# Patient Record
Sex: Female | Born: 1955 | Race: White | Hispanic: No | Smoking: Current every day smoker
Health system: Southern US, Community
[De-identification: ages and names within clinical notes are randomized; demographics above are authoritative.]

## PROBLEM LIST (undated history)

## (undated) ENCOUNTER — Emergency Department (HOSPITAL_COMMUNITY): Admission: EM | Payer: Medicare Other | Source: Home / Self Care

## (undated) DIAGNOSIS — F329 Major depressive disorder, single episode, unspecified: Secondary | ICD-10-CM

## (undated) DIAGNOSIS — T4145XA Adverse effect of unspecified anesthetic, initial encounter: Secondary | ICD-10-CM

## (undated) DIAGNOSIS — F419 Anxiety disorder, unspecified: Secondary | ICD-10-CM

## (undated) DIAGNOSIS — E039 Hypothyroidism, unspecified: Secondary | ICD-10-CM

## (undated) DIAGNOSIS — I749 Embolism and thrombosis of unspecified artery: Secondary | ICD-10-CM

## (undated) DIAGNOSIS — IMO0001 Reserved for inherently not codable concepts without codable children: Secondary | ICD-10-CM

## (undated) DIAGNOSIS — K529 Noninfective gastroenteritis and colitis, unspecified: Secondary | ICD-10-CM

## (undated) DIAGNOSIS — Z87440 Personal history of urinary (tract) infections: Secondary | ICD-10-CM

## (undated) DIAGNOSIS — K3184 Gastroparesis: Principal | ICD-10-CM

## (undated) DIAGNOSIS — E785 Hyperlipidemia, unspecified: Secondary | ICD-10-CM

## (undated) DIAGNOSIS — G43909 Migraine, unspecified, not intractable, without status migrainosus: Secondary | ICD-10-CM

## (undated) DIAGNOSIS — R079 Chest pain, unspecified: Secondary | ICD-10-CM

## (undated) DIAGNOSIS — K219 Gastro-esophageal reflux disease without esophagitis: Secondary | ICD-10-CM

## (undated) DIAGNOSIS — I10 Essential (primary) hypertension: Secondary | ICD-10-CM

## (undated) DIAGNOSIS — I2510 Atherosclerotic heart disease of native coronary artery without angina pectoris: Secondary | ICD-10-CM

## (undated) DIAGNOSIS — F32A Depression, unspecified: Secondary | ICD-10-CM

## (undated) DIAGNOSIS — T8859XA Other complications of anesthesia, initial encounter: Secondary | ICD-10-CM

## (undated) DIAGNOSIS — D649 Anemia, unspecified: Secondary | ICD-10-CM

## (undated) DIAGNOSIS — F4310 Post-traumatic stress disorder, unspecified: Secondary | ICD-10-CM

## (undated) DIAGNOSIS — K922 Gastrointestinal hemorrhage, unspecified: Secondary | ICD-10-CM

## (undated) DIAGNOSIS — Z720 Tobacco use: Secondary | ICD-10-CM

## (undated) DIAGNOSIS — Z5189 Encounter for other specified aftercare: Secondary | ICD-10-CM

## (undated) HISTORY — PX: BREAST SURGERY: SHX581

## (undated) HISTORY — DX: Chest pain, unspecified: R07.9

## (undated) HISTORY — DX: Tobacco use: Z72.0

## (undated) HISTORY — DX: Hypothyroidism, unspecified: E03.9

## (undated) HISTORY — DX: Hyperlipidemia, unspecified: E78.5

## (undated) HISTORY — DX: Personal history of urinary (tract) infections: Z87.440

## (undated) HISTORY — PX: BREAST EXCISIONAL BIOPSY: SUR124

## (undated) HISTORY — PX: CORONARY ANGIOPLASTY WITH STENT PLACEMENT: SHX49

## (undated) HISTORY — DX: Atherosclerotic heart disease of native coronary artery without angina pectoris: I25.10

## (undated) HISTORY — DX: Essential (primary) hypertension: I10

## (undated) HISTORY — DX: Noninfective gastroenteritis and colitis, unspecified: K52.9

---

## 1979-02-24 HISTORY — PX: ABDOMINAL HYSTERECTOMY: SHX81

## 1979-02-24 HISTORY — PX: PILONIDAL CYST / SINUS EXCISION: SUR543

## 1979-02-24 HISTORY — PX: TMJ ARTHROPLASTY: SHX1066

## 1979-02-24 HISTORY — PX: SALPINGOOPHORECTOMY: SHX82

## 1981-06-25 HISTORY — PX: VESICOVAGINAL FISTULA CLOSURE W/ TAH: SUR271

## 1999-02-24 HISTORY — PX: CHOLECYSTECTOMY: SHX55

## 2000-06-25 DIAGNOSIS — I749 Embolism and thrombosis of unspecified artery: Secondary | ICD-10-CM

## 2000-06-25 HISTORY — PX: CORONARY ARTERY BYPASS GRAFT: SHX141

## 2000-06-25 HISTORY — DX: Embolism and thrombosis of unspecified artery: I74.9

## 2002-02-21 ENCOUNTER — Encounter: Payer: Self-pay | Admitting: Emergency Medicine

## 2002-02-21 ENCOUNTER — Inpatient Hospital Stay (HOSPITAL_COMMUNITY): Admission: EM | Admit: 2002-02-21 | Discharge: 2002-02-24 | Payer: Self-pay | Admitting: Emergency Medicine

## 2007-01-13 ENCOUNTER — Emergency Department (HOSPITAL_COMMUNITY): Admission: EM | Admit: 2007-01-13 | Discharge: 2007-01-13 | Payer: Self-pay | Admitting: Emergency Medicine

## 2007-01-25 ENCOUNTER — Emergency Department (HOSPITAL_COMMUNITY): Admission: EM | Admit: 2007-01-25 | Discharge: 2007-01-25 | Payer: Self-pay | Admitting: Family Medicine

## 2010-09-06 ENCOUNTER — Emergency Department (HOSPITAL_COMMUNITY): Payer: Medicare Other

## 2010-09-06 ENCOUNTER — Inpatient Hospital Stay (HOSPITAL_COMMUNITY)
Admission: EM | Admit: 2010-09-06 | Discharge: 2010-09-08 | DRG: 247 | Disposition: A | Discharge status: Home / Self Care | Payer: Medicare Other | Attending: Cardiology | Admitting: Cardiology

## 2010-09-06 DIAGNOSIS — Z7902 Long term (current) use of antithrombotics/antiplatelets: Secondary | ICD-10-CM

## 2010-09-06 DIAGNOSIS — F17200 Nicotine dependence, unspecified, uncomplicated: Secondary | ICD-10-CM | POA: Diagnosis present

## 2010-09-06 DIAGNOSIS — I498 Other specified cardiac arrhythmias: Secondary | ICD-10-CM | POA: Diagnosis present

## 2010-09-06 DIAGNOSIS — I25810 Atherosclerosis of coronary artery bypass graft(s) without angina pectoris: Secondary | ICD-10-CM | POA: Diagnosis present

## 2010-09-06 DIAGNOSIS — Z7982 Long term (current) use of aspirin: Secondary | ICD-10-CM

## 2010-09-06 DIAGNOSIS — I200 Unstable angina: Secondary | ICD-10-CM | POA: Diagnosis present

## 2010-09-06 DIAGNOSIS — R079 Chest pain, unspecified: Secondary | ICD-10-CM

## 2010-09-06 DIAGNOSIS — E785 Hyperlipidemia, unspecified: Secondary | ICD-10-CM | POA: Diagnosis present

## 2010-09-06 DIAGNOSIS — I2510 Atherosclerotic heart disease of native coronary artery without angina pectoris: Principal | ICD-10-CM | POA: Diagnosis present

## 2010-09-06 DIAGNOSIS — Z9861 Coronary angioplasty status: Secondary | ICD-10-CM

## 2010-09-06 DIAGNOSIS — E039 Hypothyroidism, unspecified: Secondary | ICD-10-CM | POA: Diagnosis present

## 2010-09-06 DIAGNOSIS — I10 Essential (primary) hypertension: Secondary | ICD-10-CM | POA: Diagnosis present

## 2010-09-06 LAB — HEPARIN LEVEL (UNFRACTIONATED): Heparin Unfractionated: 0.56 IU/mL (ref 0.30–0.70)

## 2010-09-06 LAB — POCT I-STAT, CHEM 8
BUN: 11 mg/dL (ref 6–23)
Calcium, Ion: 1.17 mmol/L (ref 1.12–1.32)
Chloride: 105 meq/L (ref 96–112)
Creatinine, Ser: 0.8 mg/dL (ref 0.4–1.2)
Glucose, Bld: 102 mg/dL — ABNORMAL HIGH (ref 70–99)
HCT: 42 % (ref 36.0–46.0)
Hemoglobin: 14.3 g/dL (ref 12.0–15.0)
Potassium: 4.3 meq/L (ref 3.5–5.1)
Sodium: 138 meq/L (ref 135–145)
TCO2: 21 mmol/L (ref 0–100)

## 2010-09-06 LAB — CBC
HCT: 41.7 % (ref 36.0–46.0)
Hemoglobin: 14.4 g/dL (ref 12.0–15.0)
MCH: 31.7 pg (ref 26.0–34.0)
MCHC: 34.5 g/dL (ref 30.0–36.0)

## 2010-09-06 LAB — CK TOTAL AND CKMB (NOT AT ARMC)
Relative Index: INVALID (ref 0.0–2.5)
Total CK: 52 U/L (ref 7–177)

## 2010-09-06 LAB — TROPONIN I: Troponin I: 0.01 ng/mL (ref 0.00–0.06)

## 2010-09-06 LAB — DIFFERENTIAL
Lymphocytes Relative: 42 % (ref 12–46)
Lymphs Abs: 3.4 10*3/uL (ref 0.7–4.0)
Monocytes Absolute: 0.4 10*3/uL (ref 0.1–1.0)
Monocytes Relative: 4 % (ref 3–12)
Neutro Abs: 4.2 10*3/uL (ref 1.7–7.7)

## 2010-09-06 LAB — POCT CARDIAC MARKERS: Myoglobin, poc: 38.2 ng/mL (ref 12–200)

## 2010-09-07 DIAGNOSIS — I25810 Atherosclerosis of coronary artery bypass graft(s) without angina pectoris: Secondary | ICD-10-CM

## 2010-09-07 DIAGNOSIS — I200 Unstable angina: Secondary | ICD-10-CM

## 2010-09-07 LAB — BASIC METABOLIC PANEL
BUN: 12 mg/dL (ref 6–23)
Calcium: 9 mg/dL (ref 8.4–10.5)
Creatinine, Ser: 0.72 mg/dL (ref 0.4–1.2)
GFR calc non Af Amer: >60 mL/min (ref >60)
Glucose, Bld: 93 mg/dL (ref 70–99)
Sodium: 141 mEq/L (ref 135–145)

## 2010-09-07 LAB — POCT ACTIVATED CLOTTING TIME
Activated Clotting Time: 122 seconds
Activated Clotting Time: 429 seconds

## 2010-09-07 LAB — CBC
Hemoglobin: 13.4 g/dL (ref 12.0–15.0)
MCHC: 33.3 g/dL (ref 30.0–36.0)
Platelets: 225 10*3/uL (ref 150–400)

## 2010-09-07 LAB — LIPID PANEL: Triglycerides: 206 mg/dL — ABNORMAL HIGH (ref <150)

## 2010-09-07 LAB — PROTIME-INR
INR: 0.99 (ref 0.00–1.49)
Prothrombin Time: 13.3 seconds (ref 11.6–15.2)

## 2010-09-07 LAB — CARDIAC PANEL(CRET KIN+CKTOT+MB+TROPI)
CK, MB: 0.7 ng/mL (ref 0.3–4.0)
Relative Index: INVALID (ref 0.0–2.5)
Total CK: 46 U/L (ref 7–177)

## 2010-09-07 LAB — HEMOGLOBIN A1C: Mean Plasma Glucose: 114 mg/dL (ref <117)

## 2010-09-08 DIAGNOSIS — I200 Unstable angina: Secondary | ICD-10-CM

## 2010-09-08 LAB — CARDIAC PANEL(CRET KIN+CKTOT+MB+TROPI)
Relative Index: INVALID (ref 0.0–2.5)
Total CK: 45 U/L (ref 7–177)
Troponin I: 0.08 ng/mL — ABNORMAL HIGH (ref 0.00–0.06)

## 2010-09-08 LAB — CBC
Hemoglobin: 12.7 g/dL (ref 12.0–15.0)
Platelets: 190 10*3/uL (ref 150–400)
RBC: 4.05 MIL/uL (ref 3.87–5.11)
WBC: 5.9 10*3/uL (ref 4.0–10.5)

## 2010-09-08 LAB — BASIC METABOLIC PANEL
CO2: 26 mEq/L (ref 19–32)
Chloride: 112 mEq/L (ref 96–112)
GFR calc Af Amer: >60 mL/min (ref >60)
Potassium: 4.3 mEq/L (ref 3.5–5.1)
Sodium: 141 mEq/L (ref 135–145)

## 2010-09-09 ENCOUNTER — Observation Stay (HOSPITAL_COMMUNITY)
Admission: EM | Admit: 2010-09-09 | Discharge: 2010-09-10 | Disposition: A | Discharge status: Home / Self Care | Payer: Medicare Other | Attending: Cardiovascular Disease | Admitting: Cardiovascular Disease

## 2010-09-09 ENCOUNTER — Emergency Department (HOSPITAL_COMMUNITY): Payer: Medicare Other

## 2010-09-09 DIAGNOSIS — Z79899 Other long term (current) drug therapy: Secondary | ICD-10-CM | POA: Insufficient documentation

## 2010-09-09 DIAGNOSIS — Z9861 Coronary angioplasty status: Secondary | ICD-10-CM | POA: Insufficient documentation

## 2010-09-09 DIAGNOSIS — I2510 Atherosclerotic heart disease of native coronary artery without angina pectoris: Secondary | ICD-10-CM | POA: Insufficient documentation

## 2010-09-09 DIAGNOSIS — E039 Hypothyroidism, unspecified: Secondary | ICD-10-CM | POA: Insufficient documentation

## 2010-09-09 DIAGNOSIS — I498 Other specified cardiac arrhythmias: Secondary | ICD-10-CM | POA: Insufficient documentation

## 2010-09-09 DIAGNOSIS — R079 Chest pain, unspecified: Principal | ICD-10-CM | POA: Insufficient documentation

## 2010-09-09 DIAGNOSIS — E785 Hyperlipidemia, unspecified: Secondary | ICD-10-CM | POA: Insufficient documentation

## 2010-09-09 DIAGNOSIS — I10 Essential (primary) hypertension: Secondary | ICD-10-CM | POA: Insufficient documentation

## 2010-09-09 DIAGNOSIS — Z7982 Long term (current) use of aspirin: Secondary | ICD-10-CM | POA: Insufficient documentation

## 2010-09-09 DIAGNOSIS — Z7902 Long term (current) use of antithrombotics/antiplatelets: Secondary | ICD-10-CM | POA: Insufficient documentation

## 2010-09-09 DIAGNOSIS — F17200 Nicotine dependence, unspecified, uncomplicated: Secondary | ICD-10-CM | POA: Insufficient documentation

## 2010-09-09 LAB — TROPONIN I: Troponin I: 0.06 ng/mL (ref 0.00–0.06)

## 2010-09-09 LAB — URINALYSIS, ROUTINE W REFLEX MICROSCOPIC
Glucose, UA: NEGATIVE mg/dL
Ketones, ur: NEGATIVE mg/dL
Leukocytes, UA: NEGATIVE
Nitrite: NEGATIVE
Protein, ur: NEGATIVE mg/dL

## 2010-09-09 LAB — DIFFERENTIAL
Basophils Absolute: 0 10*3/uL (ref 0.0–0.1)
Basophils Relative: 0 % (ref 0–1)
Eosinophils Absolute: 0.1 10*3/uL (ref 0.0–0.7)
Neutro Abs: 6 10*3/uL (ref 1.7–7.7)
Neutrophils Relative %: 69 % (ref 43–77)

## 2010-09-09 LAB — CBC
Hemoglobin: 15 g/dL (ref 12.0–15.0)
MCH: 32.7 pg (ref 26.0–34.0)
MCHC: 35.7 g/dL (ref 30.0–36.0)
Platelets: 221 10*3/uL (ref 150–400)
RBC: 4.59 MIL/uL (ref 3.87–5.11)

## 2010-09-09 LAB — CK TOTAL AND CKMB (NOT AT ARMC)
Relative Index: INVALID (ref 0.0–2.5)
Relative Index: INVALID (ref 0.0–2.5)
Total CK: 61 U/L (ref 7–177)

## 2010-09-09 LAB — BASIC METABOLIC PANEL
Calcium: 9.6 mg/dL (ref 8.4–10.5)
Chloride: 105 mEq/L (ref 96–112)
Creatinine, Ser: 0.66 mg/dL (ref 0.4–1.2)
GFR calc Af Amer: >60 mL/min (ref >60)
GFR calc non Af Amer: >60 mL/min (ref >60)

## 2010-09-09 LAB — URINE MICROSCOPIC-ADD ON

## 2010-09-10 DIAGNOSIS — R079 Chest pain, unspecified: Secondary | ICD-10-CM

## 2010-09-10 LAB — TROPONIN I
Troponin I: 0.05 ng/mL (ref 0.00–0.06)
Troponin I: 0.05 ng/mL (ref 0.00–0.06)

## 2010-09-10 LAB — CK TOTAL AND CKMB (NOT AT ARMC)
CK, MB: 0.4 ng/mL (ref 0.3–4.0)
Relative Index: INVALID (ref 0.0–2.5)
Total CK: 38 U/L (ref 7–177)

## 2010-09-10 LAB — PLATELET INHIBITION P2Y12: P2Y12 % Inhibition: 38 %

## 2010-09-14 NOTE — Discharge Summary (Signed)
[name], [name]                ACCOUNT NO.:  [id number]  MEDICAL RECORD NO.:  [id number]           PATIENT TYPE:  I  LOCATION:  2918                         FACILITY:  [facility]  PHYSICIAN:  Renelle Stegenga M. [place], M.D.  DATE OF BIRTH:  1955/11/07  DATE OF ADMISSION:  09/06/2010 DATE OF DISCHARGE:  09/08/2010                              DISCHARGE SUMMARY   PRIMARY CARDIOLOGIST:  Krisy Dix M. [place], MD.  She was previously followed by Dr. Lillia Dallas in [place], [place], fax number [phone].  DISCHARGE DIAGNOSIS:  Unstable angina.  SECONDARY DIAGNOSES: 1. Coronary artery disease, status post prior coronary artery bypass     grafting x2 with subsequent stenting of the vein graft to the     obtuse marginal. 2. Hypertension. 3. Hyperlipidemia. 4. Ongoing tobacco abuse. 5. History of sinus tachycardia. 6. Hypothyroidism. 7. History of multiple urinary tract infections. 8. History of mastocytic enterocolitis.  ALLERGIES:  No known drug allergies.  PROCEDURES:  Left heart cardiac catheterization revealing 60-70% stenosis within the ostium of the vein graft to the ramus intermedius followed by a 90% stenosis in the distal body of the same graft.  The patient has a chronically occluded obtuse marginal.  There was also noted to be an occluded vein graft of the aortic root.  The subclavian was atretic.  The vein graft to the intermedius was successfully stented proximally with 2.75 x 38 mm Xience Prime drug-eluting stent.  The distal portion of the graft was stented with a 3.0 x 24 mm Promus Element Plus drug-eluting stent.  HISTORY OF PRESENT ILLNESS:  This is a 55 year old female with prior history of CAD status post two-vessel CABG in [place] who has recently moved to [place] and has not yet established regular care.  She was in the usual state of health until the day of admission when she developed substernal chest discomfort associated with nausea and diaphoresis  occurring with exertion and relieved after a total of 4 sublingual nitroglycerin tablets.  She presented to [facility] ED where ECG showed no acute changes and point-of-care markers were negative.  She admitted for further evaluation.  HOSPITAL COURSE:  Given the patient's prior history and presentation, decision was made to pursue cardiac catheterization.  This took place on September 07, 2010, revealing significant stenoses throughout the body of the vein graft to the ramus intermedius with previously placed patent stent in the midportion of the body of the graft.  This was felt to be the culprit vessel and was felt subsequently stented with 2 drug-eluting stents as outlined above involving the proximal and distal portions of the graft.  The patient tolerated this procedure well and postprocedure has been ambulating without recurrent symptoms or limitations.  She has been seen by cardiac rehab and also counseled on the importance of the smoking cessation.  She will be discharged home today in good condition.  DISCHARGE LABORATORY DATA:  Hemoglobin 12.7, hematocrit 38.2, WBC 5.9, and platelets 190.  Sodium 141, potassium 4.3, chloride 112, CO2 of 26, BUN 8, creatinine 0.67, glucose 86, and calcium 8.4.  Hemoglobin A1c 5.6.  CK 45, MB 1.6,  and troponin I of 0.08 (following PCI).  Total cholesterol 184, triglycerides 206, HDL 32, and LDL 111.  TSH 3.810. MRSA screen was negative.  DISPOSITION:  The patient will be discharged home today in good condition.  FOLLOWUP PLANS AND APPOINTMENTS:  We have arranged followup with Dr. Oluwaferanmi Wain [place] on September 28, 2010, at 4:15 p.m.  We have recommended that the patient obtain primary care followup locally.  DISCHARGE MEDICATIONS: 1. Ambien 10 mg nightly p.r.n. 2. Nitroglycerin 0.4 mg sublingual p.r.n. chest pain. 3. Xanax 0.25 mg q.12 h. p.r.n. 4. Aspirin 81 mg daily. 5. Plavix 75 mg daily. 6. Pravachol 40 mg daily. 7. Synthroid 25 mcg  daily. 8. Toprol-XL 50 mg nightly.  OUTSTANDING LABORATORY DATA AND STUDIES:  None.  DURATION OF DISCHARGE ENCOUNTER:  45 minutes including physician time.     Nicolasa Ducking, ANP   ______________________________ Knox Holdman M. [place], M.D.    CB/MEDQ  D:  09/08/2010  T:  09/09/2010  Job:  [id number]  Electronically Signed by Nicolasa Ducking ANP on 09/11/2010 12:07:06 PM Electronically Signed by Jannetta Massey [place] M.D. on 09/14/2010 03:40:59 PM

## 2010-09-14 NOTE — H&P (Signed)
[name], [name]                ACCOUNT NO.:  [id number]  MEDICAL RECORD NO.:  [id number]           PATIENT TYPE:  I  LOCATION:  2918                         FACILITY:  [facility]  PHYSICIAN:  Lilyonna Steidle M. [place], M.D.  DATE OF BIRTH:  1955-12-19  DATE OF ADMISSION:  09/06/2010 DATE OF DISCHARGE:                             HISTORY & PHYSICAL   PRIMARY CARE PHYSICIAN:  None.  PRIMARY CARDIOLOGIST:  Lillia Dallas, MD in [place], [place], phone number [phone], fax number [phone].  LOCAL CARDIOLOGIST:  Madolyn Frieze. Jens Som, MD, [facility], who saw her in 2003 in the hospital.  CHIEF COMPLAINT:  Chest pain.  HISTORY OF PRESENT ILLNESS:  [name] is a 55 year old female with a history of coronary artery disease.  She has occasional chest pain and one episode approximately a week ago was especially severe requiring sublingual nitroglycerin x8 and aspirin 325 mg.  Her symptoms finally resolved and she did not seek medical care.  She has had no chest pain since then until today.  Today, with minimal activity, she had chest pain.  It was cramping and substernal.  It reached an 8/10.  It was associated with nausea and diaphoresis, but no shortness of breath and no vomiting.  She took sublingual nitroglycerin x2, which decreased her symptoms, so she repeated that for total of 4.  Her symptoms continued and her son insisted that she come into the emergency room.  She was started on IV nitroglycerin and the cramping chest pain resolved.  She had some pain in her right shoulder and scapular area, which is a relatively new symptom.  About 3:00 p.m. today, she developed stabbing chest pain at a 6/10.  She had this yesterday as well, but it is not anything that she gets frequently and she does not associate this with angina.  The pain does not change with deep inspiration or cough.  It is improved by some position changes.  She continues to identify the stabbing chest pain as a 6/10, but does  not appear to be acutely uncomfortable.  PAST MEDICAL HISTORY: 1. Status post aortocoronary bypass surgery in 2002 in [place] with     two-vessel. 2. Status post cardiac catheterization in 2009 with PTCA and 3.5 x 28     mm PROMUS drug-eluting stent to the SVG to OM. 3. Status post cardiac catheterization in 2011 with no PCI. 4. Borderline hypertension. 5. Hyperlipidemia. 6. Ongoing tobacco use. 7. Family history of coronary artery disease. 8. History of tachycardia with a resting heart rate in the 120s. 9. Hypothyroidism. 10.History of multiple UTIs. 11.History of mastocytic enterocolitis.  SURGICAL HISTORY:  She is status post cardiac catheterizations as well as bypass surgery, colonoscopy and EGD.  ALLERGIES:  NO KNOWN DRUG ALLERGIES.  CURRENT MEDICATIONS: 1. Aspirin 81 mg a day. 2. Plavix 75 mg a day. 3. Pravachol 40 mg a day. 4. Synthroid 25 mcg daily. 5. Toprol XL 50 mg a day.  SOCIAL HISTORY:  She has moved recently from [place], [place] to [place] to be with family.  She is disabled.  She has approximately 30-pack-year history of ongoing tobacco use, but  denies alcohol or drug abuse.  FAMILY HISTORY:  Both parents are deceased and her father had coronary artery disease as well as a stroke, but her mother and siblings have no cardiac issues.  REVIEW OF SYSTEMS:  She has not had fevers or chills, but has had diaphoresis with the chest pain.  The chest pain is described above. She has some chronic dyspnea on exertion as well that has not changed recently.  She denies orthopnea, PND.  Has occasional palpitations and cough, but denies significant wheezing.  She has not had presyncope or syncope.  She states that she has dark and foul smelling urine on a chronic basis, and at one point, was on antibiotics for a year for UTIs. She has chronic arthralgias and joint pains and chronic abdominal symptoms as well.  Chronic insomnia treated with Tylenol PM x4  nightly. Full 14-point review of systems is otherwise negative except as stated in the HPI.  PHYSICAL EXAMINATION:  VITAL SIGNS:  Temperature is 98.3, blood pressure 120/89, heart rate 106, respiratory rate 16, O2 saturation 99% on room air. GENERAL:  She is a well-developed, well-nourished white female in no acute distress. HEENT:  Normal. NECK:  There is no lymphadenopathy, thyromegaly, bruit or JVD noted. CV:  Her heart is regular in rate and rhythm with an S1-S2 and no significant murmur, rub or gallop is noted.  Distal pulses are intact in all 4 extremities. LUNGS:  Essentially, clear to auscultation bilaterally. SKIN:  No rashes or lesions are noted. ABDOMEN:  Soft and nontender with active bowel sounds. EXTREMITIES:  There is no cyanosis, clubbing or edema noted. MUSCULOSKELETAL:  There is no joint deformity or effusions, and no spine or CVA tenderness. NEURO:  She is alert and oriented with cranial nerves II-XII grossly intact.  Chest x-ray, post CABG, no active disease.  LABORATORY VALUES:  Hemoglobin 14.4, hematocrit 41.7, WBCs 8.1, platelets 238,000.  Sodium 138, potassium 4.3, chloride 105, BUN 11, creatinine 0.8, glucose 102.  Point-of-care markers negative x1.  EKG:  Sinus rhythm rate 112, sinus tachycardia with diffuse ST changes that are different from an EKG dated 2003.  IMPRESSION:  [name] was seen today by Dr. [place], the patient evaluated and the data reviewed.  [name] is a 55 year old female with a history of coronary artery disease, status post prior two-vessel bypass surgery in 2002.  In 2009, she had a 3.5 x 28 mm PROMUS drug-eluting stent to the SVG to OM.  She reports that her other graft was occluded with collateral circulation. She presents with a 2-week history of worsening chest pain that was relieved with sublingual nitroglycerin initially and today required IV nitroglycerin.  Her EKG shows diffuse T-wave flattening.  Her  initial enzymes are negative.  Given the crescendo angina, we recommend cardiac catheterization in a.m. and this has been scheduled.  We will add IV nitroglycerin and heparin.  We will continue to cycle cardiac enzymes and continue her home dose of aspirin and Plavix as well.  A release of information form has been sent to her primary cardiologist in [place]. Further evaluation and treatment will depend on the results of the above testing and the records obtained.     Theodore Demark, PA-C   ______________________________ Mairely Foxworth M. [place], M.D.    RB/MEDQ  D:  09/06/2010  T:  09/07/2010  Job:  [id number]  cc:   Lillia Dallas, MD  Electronically Signed by Theodore Demark PA-C on 09/12/2010 04:30:33 PM Electronically Signed by Prentiss Hammett [place]  M.D. on 09/14/2010 03:40:54 PM

## 2010-09-16 ENCOUNTER — Emergency Department (HOSPITAL_COMMUNITY)
Admission: EM | Admit: 2010-09-16 | Discharge: 2010-09-16 | Disposition: A | Discharge status: Home / Self Care | Payer: Medicare Other | Attending: Emergency Medicine | Admitting: Emergency Medicine

## 2010-09-16 ENCOUNTER — Emergency Department (HOSPITAL_COMMUNITY): Payer: Medicare Other

## 2010-09-16 DIAGNOSIS — R072 Precordial pain: Secondary | ICD-10-CM | POA: Insufficient documentation

## 2010-09-16 DIAGNOSIS — I2510 Atherosclerotic heart disease of native coronary artery without angina pectoris: Secondary | ICD-10-CM | POA: Insufficient documentation

## 2010-09-16 DIAGNOSIS — Z951 Presence of aortocoronary bypass graft: Secondary | ICD-10-CM | POA: Insufficient documentation

## 2010-09-16 DIAGNOSIS — E7800 Pure hypercholesterolemia, unspecified: Secondary | ICD-10-CM | POA: Insufficient documentation

## 2010-09-16 DIAGNOSIS — I10 Essential (primary) hypertension: Secondary | ICD-10-CM | POA: Insufficient documentation

## 2010-09-16 DIAGNOSIS — Z23 Encounter for immunization: Secondary | ICD-10-CM | POA: Insufficient documentation

## 2010-09-16 LAB — DIFFERENTIAL
Basophils Absolute: 0 K/uL (ref 0.0–0.1)
Basophils Relative: 0 % (ref 0–1)
Eosinophils Absolute: 0.3 K/uL (ref 0.0–0.7)
Eosinophils Relative: 3 % (ref 0–5)
Lymphocytes Relative: 39 % (ref 12–46)
Lymphs Abs: 3.4 10*3/uL (ref 0.7–4.0)
Monocytes Absolute: 0.4 K/uL (ref 0.1–1.0)
Monocytes Relative: 4 % (ref 3–12)
Neutro Abs: 4.7 10*3/uL (ref 1.7–7.7)
Neutrophils Relative %: 53 % (ref 43–77)

## 2010-09-16 LAB — CBC
HCT: 40.7 % (ref 36.0–46.0)
Hemoglobin: 14.1 g/dL (ref 12.0–15.0)
MCH: 32.1 pg (ref 26.0–34.0)
MCHC: 34.6 g/dL (ref 30.0–36.0)
MCV: 92.7 fL (ref 78.0–100.0)
Platelets: 281 10*3/uL (ref 150–400)
RBC: 4.39 MIL/uL (ref 3.87–5.11)
RDW: 12.8 % (ref 11.5–15.5)
WBC: 8.7 10*3/uL (ref 4.0–10.5)

## 2010-09-16 LAB — POCT I-STAT, CHEM 8
BUN: 13 mg/dL (ref 6–23)
Calcium, Ion: 1.13 mmol/L (ref 1.12–1.32)
Chloride: 106 mEq/L (ref 96–112)
Creatinine, Ser: 0.8 mg/dL (ref 0.4–1.2)
Glucose, Bld: 102 mg/dL — ABNORMAL HIGH (ref 70–99)
HCT: 42 % (ref 36.0–46.0)
Hemoglobin: 14.3 g/dL (ref 12.0–15.0)
Potassium: 4.4 meq/L (ref 3.5–5.1)
Sodium: 141 meq/L (ref 135–145)
TCO2: 25 mmol/L (ref 0–100)

## 2010-09-16 LAB — POCT CARDIAC MARKERS
CKMB, poc: <1 ng/mL — ABNORMAL LOW (ref 1.0–8.0)
Myoglobin, poc: 21.7 ng/mL (ref 12–200)
Troponin i, poc: <0.05 ng/mL (ref 0.00–0.09)

## 2010-09-21 NOTE — Discharge Summary (Addendum)
[name], [name]                ACCOUNT NO.:  [id number]  MEDICAL RECORD NO.:  [id number]           PATIENT TYPE:  O  LOCATION:  2030                         FACILITY:  [facility]  PHYSICIAN:  Vesta Mixer, M.D. DATE OF BIRTH:  03/25/1956  DATE OF ADMISSION:  09/09/2010 DATE OF DISCHARGE:  09/10/2010                              DISCHARGE SUMMARY   DISCHARGE DIAGNOSES: 1. Chest pain, cardiac, with negative cardiac enzymes and nonacute     EKG. 2. Coronary artery disease.     a.     Status post coronary artery bypass grafting x2 in [place].     b.     Status post percutaneous transluminal coronary angioplasty      and Promus drug-eluting stent in the SVG to the OM in 2009.     c.     Recent intervention with Promus drug-eluting stent placement      in the SVG to the ramus, September 07, 2010. 3. Hypertension. 4. Hyperlipidemia. 5. Ongoing tobacco abuse. 6. History of sinus tachycardia. 7. Hypothyroidism. 8. History of multiple urinary tract infections and a history of     mastocytic enterocolitis.  HOSPITAL COURSE:  [name] is a 55 year old female with CAD, status post CABG and recent PCI with drug-eluting stent placement in the SVG to the large ramus vessel by Dr. Riley Kill.  She presented to [facility] with complaints of recurrent chest pain.  She has had frequent episodes of this lasting about 45 seconds at a time, like a burning heavy sensation.  These episodes have not really changed since her stent procedure and she has had them for several years.  She was admitted to the hospital for rule out and cardiac enzymes were cycled which were negative x4.  She was not felt to need repeat cardiac catheterization at this time.  There was some suggestion by Dr. Elease Hashimoto and Dr. Shirlee Latch that perhaps this was noncardiac chest pain all along as her enzymes have remained negative.  EKG is nonacute on top of chronicity of the pain. Dr. Shirlee Latch has seen and examined her today and  feels she is stable for discharge.  DISCHARGE LABS:  WBC 8.8, hemoglobin 15, hematocrit 42, platelet count 221, sodium 140, potassium 4.4, chloride 105, CO2 of 26, glucose 92, BUN 6, creatinine 0.66.  P2Y12 testing showed a PRU of 207 with baseline of 331 and percent inhibition of 38.  STUDIES:  Chest x-ray, September 09, 2010 showed small nodular opacity seen in both upper lobes which were not seen on previous exam, cannot exclude pulmonary nodules.  CT chest without contrast was recommended to exclude pulmonary nodules.  DISCHARGE MEDICATIONS: 1. Ambien 10 mg nightly p.r.n. 2. Aspirin 81 mg daily. 3. Nitroglycerin sublingual 0.4 mg every 5 minutes as needed up to 3     doses. 4. Plavix 35 mg daily. 5. Pravachol 40 mg daily. 6. Synthroid 25 mcg daily. 7. Toprol-XL 50 mg nightly. 8. Xanax 0.25 mg every 12 hours p.r.n. for anxiety.  DISPOSITION:  [name] will be discharged in stable condition to home. Because of her recent catheterization, she is not  to lift anything or participate in sexual activity for 2 days.  She is to follow a low- sodium, heart-healthy diet.  Our office will call her to rule out her followup appointment with Dr. [place] as Dr. Shirlee Latch wants her to be seen within the next upcoming week or so.  She will also be instructed to follow up with her primary care provider in regards to possible CT of the chest given her abnormal chest x-ray this admission.  DURATION OF DISCHARGE ENCOUNTER:  Greater than 30 minutes including physician and PA time.     [name], P.A.C.   ______________________________ Vesta Mixer, M.D.    DD/MEDQ  D:  09/10/2010  T:  09/11/2010  Job:  [id number]  cc:   Peter M. [place], M.D.  Electronically Signed by [name]  on 09/21/2010 12:21:10 PM Electronically Signed by Kristeen Miss M.D. on 10/03/2010 09:07:07 AM

## 2010-09-28 ENCOUNTER — Encounter: Payer: Self-pay | Admitting: Cardiology

## 2010-09-28 ENCOUNTER — Ambulatory Visit (INDEPENDENT_AMBULATORY_CARE_PROVIDER_SITE_OTHER): Payer: Medicare Other | Admitting: Cardiology

## 2010-09-28 DIAGNOSIS — R079 Chest pain, unspecified: Secondary | ICD-10-CM | POA: Insufficient documentation

## 2010-09-28 DIAGNOSIS — F17200 Nicotine dependence, unspecified, uncomplicated: Secondary | ICD-10-CM

## 2010-09-28 DIAGNOSIS — Z87440 Personal history of urinary (tract) infections: Secondary | ICD-10-CM | POA: Insufficient documentation

## 2010-09-28 DIAGNOSIS — I10 Essential (primary) hypertension: Secondary | ICD-10-CM

## 2010-09-28 DIAGNOSIS — I2510 Atherosclerotic heart disease of native coronary artery without angina pectoris: Secondary | ICD-10-CM | POA: Insufficient documentation

## 2010-09-28 DIAGNOSIS — E785 Hyperlipidemia, unspecified: Secondary | ICD-10-CM | POA: Insufficient documentation

## 2010-09-28 DIAGNOSIS — E039 Hypothyroidism, unspecified: Secondary | ICD-10-CM | POA: Insufficient documentation

## 2010-09-28 DIAGNOSIS — K5289 Other specified noninfective gastroenteritis and colitis: Secondary | ICD-10-CM

## 2010-09-28 DIAGNOSIS — K529 Noninfective gastroenteritis and colitis, unspecified: Secondary | ICD-10-CM | POA: Insufficient documentation

## 2010-09-28 DIAGNOSIS — Z8679 Personal history of other diseases of the circulatory system: Secondary | ICD-10-CM | POA: Insufficient documentation

## 2010-09-28 DIAGNOSIS — Z720 Tobacco use: Secondary | ICD-10-CM

## 2010-09-28 MED ORDER — ALPRAZOLAM 0.25 MG PO TABS: 0.2500 mg | ORAL_TABLET | Freq: Every evening | ORAL | Status: DC | PRN

## 2010-09-28 NOTE — Patient Instructions (Signed)
Take Dexilant 60 mg daily for acid reflux.  Continue other medications.  We will follow up with you in 1 month for an office visit.

## 2010-09-28 NOTE — Assessment & Plan Note (Signed)
Recommended smoking cessation. 

## 2010-09-28 NOTE — Assessment & Plan Note (Addendum)
Her chest pain symptoms did not respond to stenting of the vein graft to the intermediate. Her chest CT was unremarkable. Her symptoms are atypical. I've recommended a trial of Dexilant 60 mg daily to see if this will improve her chest pain. We will refill all her medications today with only a 30 day supply of her Ambien and Xanax. She needs to establish with a primary care physician.

## 2010-09-28 NOTE — Progress Notes (Signed)
HPI [name] is seen for followup after recent hospitalization. She recently moved from [name]. She was admitted with refractory chest pain. She underwent cardiac catheterization on March 15 by Dr. Riley Kill. She has a history of prior coronary bypass surgery x2 in 2002. She had had prior stenting of the saphenous vein graft to the intermediate branch. Repeat cardiac catheterization demonstrated severe stenosis in the distal vein graft. The proximal vein graft had moderate diffuse disease. The stented segment was still patent. She underwent repeat stenting of the proximal and distal vein graft with an excellent result. She continued to have chest pain and was admitted 2 days later and ruled out for myocardial infarction. She continues to smoke. She states she has a constant burning chest pain that is worse at night. It lasts all day long. It is associated with a stabbing pain in her back. It is noteworthy that she did have a CT angiogram of the chest which was negative. It did show some nodular densities in the upper lobes. The patient claims that she has some scarring from before. No Known Allergies  Current Outpatient Prescriptions on File Prior to Visit  Medication Sig Dispense Refill  . ALPRAZolam (XANAX) 0.25 MG tablet Take 0.25 mg by mouth at bedtime as needed.        Marland Kitchen aspirin 81 MG tablet Take 81 mg by mouth daily.        . clopidogrel (PLAVIX) 75 MG tablet Take 75 mg by mouth daily.        Marland Kitchen levothyroxine (SYNTHROID, LEVOTHROID) 25 MCG tablet Take 25 mcg by mouth daily.        . metoprolol (TOPROL-XL) 50 MG 24 hr tablet Take 50 mg by mouth at bedtime.        . nitroGLYCERIN (NITROSTAT) 0.4 MG SL tablet Place 0.4 mg under the tongue every 5 (five) minutes as needed.        . pravastatin (PRAVACHOL) 40 MG tablet Take 40 mg by mouth daily.        Marland Kitchen zolpidem (AMBIEN) 10 MG tablet Take 10 mg by mouth at bedtime as needed.          Past Medical History  Diagnosis Date  . Chest pain, cardiac      with negative cardiac enzymes and non acute  . Coronary artery disease   . Hypertension   . Hyperlipidemia   . Tobacco abuse   . History of sinus tachycardia   . Hypothyroidism   . Hx: UTI (urinary tract infection)     multiple  . Enterocolitis     mastocytic    Past Surgical History  Procedure Date  . Coronary artery bypass graft 2002  . Coronary angioplasty     Family History  Problem Relation Age of Onset  . Heart disease Father     History   Social History  . Marital Status: Widowed    Spouse Name: N/A    Number of Children: N/A  . Years of Education: N/A   Occupational History  . Not on file.   Social History Main Topics  . Smoking status: Former Smoker -- 0.3 packs/day for 30 years    Types: Cigarettes  . Smokeless tobacco: Not on file  . Alcohol Use: No  . Drug Use: No  . Sexually Active: Not on file   Other Topics Concern  . Not on file   Social History Narrative  . No narrative on file    ROS She complains of  feeling lightheaded at times. She has occasional right arm pain. She has chronic shortness of breath. She denies any dizziness or syncope. She does have occasional headache. All other systems are reviewed and are negative. PHYSICAL EXAM BP 128/78  Pulse 70  Ht 5' (1.524 m)  Wt 146 lb 12.8 oz (66.588 kg)  BMI 28.67 kg/m2 The patient is alert and oriented x 3.  The mood and affect are normal.  The skin is warm and dry.  Color is normal.  The HEENT exam reveals that the sclera are nonicteric.  The mucous membranes are moist.  The carotids are 2+ without bruits.  There is no thyromegaly.  There is no JVD.  The lungs are clear.  The chest wall is non tender.  The heart exam reveals a regular rate with a normal S1 and S2.  There are no murmurs, gallops, or rubs.  The PMI is not displaced.   Abdominal exam reveals good bowel sounds.  There is no guarding or rebound.  There is no hepatosplenomegaly or tenderness.  There are no masses.  Exam of the  legs reveal no clubbing, cyanosis, or edema. The catheter site is without hematoma. The legs are without rashes.  The distal pulses are intact.  Cranial nerves II - XII are intact.  Motor and sensory functions are intact.  The gait is normal.  Laboratory data: ECG from March 24 shows normal sinus rhythm with nonspecific T-wave abnormality.  ASSESSMENT AND PLAN

## 2010-09-28 NOTE — Assessment & Plan Note (Signed)
Blood pressure is controlled on current medications. 

## 2010-09-28 NOTE — Assessment & Plan Note (Signed)
She needs to continue with dual antiplatelet therapy. Continue beta blocker therapy and statin therapy. Will use p.r.n. Sublingual nitrates.

## 2010-10-02 ENCOUNTER — Other Ambulatory Visit: Payer: Self-pay | Admitting: *Deleted

## 2010-10-02 DIAGNOSIS — E7800 Pure hypercholesterolemia, unspecified: Secondary | ICD-10-CM

## 2010-10-02 DIAGNOSIS — I10 Essential (primary) hypertension: Secondary | ICD-10-CM

## 2010-10-02 DIAGNOSIS — I2510 Atherosclerotic heart disease of native coronary artery without angina pectoris: Secondary | ICD-10-CM

## 2010-10-02 DIAGNOSIS — E039 Hypothyroidism, unspecified: Secondary | ICD-10-CM

## 2010-10-02 MED ORDER — PRAVASTATIN SODIUM 40 MG PO TABS: 40.0000 mg | ORAL_TABLET | Freq: Every day | ORAL | Status: DC

## 2010-10-02 MED ORDER — METOPROLOL SUCCINATE ER 50 MG PO TB24: 50.0000 mg | ORAL_TABLET | Freq: Every day | ORAL | Status: DC

## 2010-10-02 MED ORDER — LEVOTHYROXINE SODIUM 25 MCG PO TABS: 25.0000 ug | ORAL_TABLET | Freq: Every day | ORAL | Status: DC

## 2010-10-02 MED ORDER — CLOPIDOGREL BISULFATE 75 MG PO TABS: 75.0000 mg | ORAL_TABLET | Freq: Every day | ORAL | Status: DC

## 2010-10-02 NOTE — Telephone Encounter (Signed)
Refilled meds per OV 4/5

## 2010-10-03 NOTE — H&P (Signed)
NAMEJANAKI, Flynn                ACCOUNT NO.:  [id number]  MEDICAL RECORD NO.:  [id number]           PATIENT TYPE:  O  LOCATION:  2030                         FACILITY:  [facility]  PHYSICIAN:  Vesta Mixer, M.D. DATE OF BIRTH:  [birth year]  DATE OF ADMISSION:  09/09/2010 DATE OF DISCHARGE:                             HISTORY & PHYSICAL   [name] is a 55 year old female with a history of coronary artery disease.  She is status post recent PTCA and stenting of the saphenous vein graft to her ramus intermediate vessel.  She is admitted to the hospital with recurrent episodes of chest discomfort.  [name] is a 55 year old female who had a history of bypass grafting in 2002.  She had frequent episodes of chest pain and follow up heart catheterization several years later revealed an occlusion of one of the grafts.  We do not have the details of the grafting since it was done down in [place].  The patient at some point had a stent placed in the saphenous vein graft to the ramus intermediate vessel.  She has recently moved up here to [facility] to be with her family.  The patient has had episodes of angina for the past years.  She has not had a repeat heart catheterization in several years.  The patient was admitted on September 06, 2010, with episodes of chest pain.  She had a heart catheterization the following day by Dr. Riley Kill and was found to have a very tight stenosis in the saphenous vein graft to the ramus intermediate vessel.  She was discharged in satisfactory condition.  She has continued to have episodes of chest discomfort that she treats with nitroglycerin and with Xanax.  These episodes of pain seemed to occur with stress and do not occur with exercise.  The pain is described as a burning and heavy like sensation.  It lasts for about 45 seconds.  It radiates up to her neck and down her left arm.  The patient has had these similar episodes for the past several years and  they have not really changed since her stent procedure.  CURRENT MEDICATIONS: 1. Ambien 10 mg at night as needed. 2. Nitroglycerin 0.4 mg sublingually as needed for chest pain. 3. Xanax 0.25 mg every 12 hours as needed. 4. Aspirin 81 mg a day. 5. Plavix 75 mg a day. 6. Pravachol 40 mg a day. 7. Synthroid 0.25 mg a day. 8. Toprol-XL 50 mg at night.  She has no known drug allergies.  PAST MEDICAL HISTORY: 1. History of coronary artery disease.  She is status post CABG.  She     is status post PTCA and stenting of the saphenous vein graft to the     ramus intermediate vessel x3. 2. Hypertension. 3. Hyperlipidemia. 4. Tobacco abuse - she stopped smoking last week. 5. History of hypothyroidism. 6. History of multiple urinary tract infections.  SOCIAL HISTORY:  The patient smoked until last week.  She is on disability.  FAMILY HISTORY:  Strongly positive for early coronary artery disease.  REVIEW OF SYSTEMS:  As noted in the HPI.  All  other systems are negative.  PHYSICAL EXAMINATION:  GENERAL:  She is a middle-aged female in no acute distress.  She is alert and oriented x3.  Her mood and affect are normal. VITAL SIGNS:  Her blood pressure is 130/70 and her heart rate is 79. HEENT:  Her mucous membranes are moist. NECK:  Carotids 2+.  She has no bruits.  No JVD.  Her neck is supple. LUNGS:  Clear. BACK:  Nontender. HEART:  Regular rate, S1 and S2. ABDOMEN:  Good bowel sounds and is nontender.  There is no hepatosplenomegaly.  There is no guarding or rebound.  EXTREMITIES:  She has no clubbing, cyanosis, or edema.  There are no palpable cords. There is no rash or skin nodules. NEUROLOGIC:  Grossly intact.  Her EKG reveals normal sinus rhythm.  She has no ST or T-wave changes.  LABORATORY DATA:  Cardiac enzymes are negative x1.  IMPRESSION AND PLAN:  Chest pain.  The patient presents with chest pains that she has had for the past several years.  These episodes of  pains really did not change after her heart catheterization.  I have looked at her coronary angiograms, her coronary arteries were fine as of 2 days ago.  We will admit her to the hospital and check cardiac enzymes.  If they are negative, then we can likely send her home tomorrow.  If she has lots more recurrent chest pain and/or positive enzymes, then we probably need to relook at her coronary arteries on Monday.  We can also consider doing a Lexiscan Myoview study for further evaluation.  The fact that she has had these episodes of chest pain for quite some time and the fact that they were not corrected with the angioplasty of the vein graft to the ramus intermediate vessel implies that these pains may not be cardiac in origin.  The lesion was fairly tight but she may be having pains that are separate from her coronary artery disease.  I have encouraged her to get a general medical doctor for further evaluation of these.  I have asked her to make sure that she stops smoking completely.  I have also asked her to exercise on a regular basis.  We will see her in the morning.     Vesta Mixer, M.D.     PJN/MEDQ  D:  09/09/2010  T:  09/10/2010  Job:  [id number]  cc:   Arturo Morton. Riley Kill, MD, [facility] Madolyn Frieze. Jens Som, MD, [facility]  Electronically Signed by [name] M.D. on 10/03/2010 09:07:11 AM

## 2010-10-05 NOTE — Procedures (Signed)
[name], [name]                ACCOUNT NO.:  [id number]  MEDICAL RECORD NO.:  [id number]           PATIENT TYPE:  LOCATION:                                 FACILITY:  PHYSICIAN:  Arturo Morton. Riley Kill, MD, FACCDATE OF BIRTH:  April 09, 1956  DATE OF PROCEDURE:  09/07/2010 DATE OF DISCHARGE:                           CARDIAC CATHETERIZATION   INDICATIONS:  Ms. Rosano is a 55 year old who has previously undergone revascularization surgery.  She presented with recurrent angina pectoris.  She has had some nausea.  Her enzymes have been negative. She has had previous stenting of the saphenous vein graft to the obtuse marginal.  She is on chronic Plavix therapy.  The current study was done to assess coronary anatomy.  PROCEDURE: 1. Left heart catheterization. 2. Selective coronary arteriography. 3. Selective left ventriculography. 4. Aortic root aortography. 5. Saphenous vein graft angiography. 6. Subclavian angiography. 7. Percutaneous stenting of the saphenous vein graft with two drug-     eluting stents.  DESCRIPTION OF PROCEDURE:  The patient was brought to the catheterization laboratory, prepped and draped in the usual fashion.  We did not have access to her prior bypass record.  The patient did provide a good history and does have a stent information from her previous vein graft intervention.  She described one graft is being closed and the other one being opened with a stent.  The current study was done to assess coronary anatomy.  She was approached from her right femoral artery using a 5-French sheath.  Ventriculography was performed in the RAO projection followed by proximal root aortography to best identify the grafts.  There was a marker.  Following this, views of the left and right coronary arteries were obtained.  We used a left bypass catheter to gain access to the vein graft.  We used a right coronary catheter to do subclavian angiography which demonstrated an atretic  internal mammary.  Following this, I asked Dr. Excell Seltzer to come down to the laboratory and he and I discussed the findings, I then discussed the case with the patient.  She has a high-grade lesion distal to her previously placed stent at the insertion into a ramus intermedius.  The rest of the anatomy is unchanged likely.  She has an atretic left internal mammary, and a widely patent left anterior descending artery. With this, percutaneous coronary intervention was recommended given her presentation and high-grade lesion in the vein graft.  She also has ostial disease in the vein graft, and Dr. Excell Seltzer and I reviewed this film with regard to protection options.  There was diffuse segmental narrowing of the proximal portion of the vein graft and a high-grade distal lesion.  We did not think there was a good landing zone for distal protection.  We pretreated the patient with intracoronary verapamil.  Bivalirudin was given according to protocol.  We used a left bypass catheter with side holes, and placed a Prowater wire down distally with a 6-French guide.  Predilatation was done with a 2.25-mm balloon.  We used the wire to try to measure landmarks.  A 3.0 x 24 Promus Element drug-eluting stent  was then placed distally at the site. Postdilatation was only done at the overlap site which had a previous 3.5 stent and 3.5 noncompliant balloon was used at this location where the overlap of the two stents was in a short segment.  There was excellent distal apposition.  There appeared to be good stent expansion. Following this, we initially placed a 33 x 2.75 Xience Prime stent proximally, but after remeasuring it did not appear to be long enough to reach both the ostium as well as mid stent.  This was exchanged for a 38- mm length which was then deployed at approximately 14-15 atmospheres. The 3.5 noncompliant balloon was then used to post dilate the overlap zone, and also the ostium of the vein  graft.  Overall, the patient tolerated the procedure well, and there were no major complications. She was taken to the holding area in satisfactory clinical condition.  HEMODYNAMIC DATA: 1. The central aortic pressure was 117/75, mean 93. 2. LV pressure 103/11. 3. There was no gradient or pullback across the aortic valve.  ANGIOGRAPHIC DATA: 1. Ventriculography was done in the RAO projection and revealed     preserved global systolic function with EF in the range of about     60%. 2. The proximal root aortography demonstrated a nondilated aortic     root.  There was no aortic regurgitation.  There was evidence of     some flow into a vein graft, although it was not well visualized.     A proximal marker was seen at the location of the takeoff. 3. The left main has some ostial tapered narrowing of about 30%. 4. The left anterior descending artery courses to the apex.  There was     no evidence of competitive filling and there was probably about 30%     to 40% proximal plaque, but none of it appears to be high-grade,     and there was excellent TIMI 3 flow to the apex.  Importantly, the     internal mammary is not seen in a retrograde fashion. 5. The subclavian was injected, and there appears to be an atretic     internal mammary artery. 6. There was a superior ramus intermedius that appears to be free of     critical disease. 7. There was a second ramus distribution-type vessel that likely is     proximally occluded.  The AV circumflex provides a small     posterolateral segment without critical disease. 8. The right coronary artery is tortuous and provides a PDA and PLA.     High-grade critical disease is not noted, although the caliber of     the vessel is small. 9. The vein graft that supplies the large ramus intermedius vessel has     been previously stented.  There is damping of the catheter     proximally, and there is diffuse narrowing right at the ostial     takeoff up into  the midgraft.  Following this, there was a     previously placed drug-eluting stent in the midportion followed by     a high-grade 90% stenosis at its insertion into the intermediate     vessel.  Following stenting, the distal insertion stenosis is     reduced to 0% with excellent runoff.  No evidence of edge tear and     no evidence of distal extravasation.  The proximal vessel     demonstrates a widely patent vessel out to the  ostium.  We used a     2.75-mm stent in this location, largely because of the reverse     nature of the size of the vein as noted, it was postdilated in the     overlap sections as well as at the ostium, but we did not post     dilate throughout the stent.  Final angiographic result was     excellent.  CONCLUSION: 1. Preserved left ventricular function. 2. Previously stented saphenous vein graft to a large ramus vessel     with high-grade new disease distally, and segmental narrowing     proximally at the ostium with successful stenting of these areas. 3. Continued patency of the left anterior descending to the apex. 4. Other findings as noted above.  The patient will needed to be treated medically.  Given the three stents, we will get a P2Y12 VerifyNow test to reassess ADP antagonist responsiveness, and since she presented as an unstable coronary syndrome, we will make appropriate changes if necessary.     Arturo Morton. Riley Kill, MD, [facility]     TDS/MEDQ  D:  09/07/2010  T:  09/08/2010  Job:  [id number]  cc:   Arturo Morton. Riley Kill, MD, [facility] Peter M. Swaziland, M.D. CV Laboratory.  Electronically Signed by Shawnie Pons MD [facility] on 10/05/2010 05:39:12 AM

## 2010-10-12 ENCOUNTER — Telehealth: Payer: Self-pay | Admitting: Cardiology

## 2010-10-12 NOTE — Telephone Encounter (Signed)
Wants to review all records. Fax:[phone] Though she is EPIC only and we only have electronic records.

## 2010-10-23 ENCOUNTER — Other Ambulatory Visit: Payer: Self-pay | Admitting: Cardiovascular Disease

## 2010-10-25 NOTE — Telephone Encounter (Signed)
Per 09/11/10 discharge summary from [facility] this pt's primary Cardiologist is Dr Peter Swaziland.  This prescription request should be forwarded to the appropriate MD.

## 2010-10-25 NOTE — Telephone Encounter (Signed)
escribe medication per fax request  

## 2010-11-01 ENCOUNTER — Ambulatory Visit (INDEPENDENT_AMBULATORY_CARE_PROVIDER_SITE_OTHER): Payer: Medicare Other | Admitting: Nurse Practitioner

## 2010-11-01 ENCOUNTER — Encounter: Payer: Self-pay | Admitting: Nurse Practitioner

## 2010-11-01 ENCOUNTER — Ambulatory Visit
Admission: RE | Admit: 2010-11-01 | Discharge: 2010-11-01 | Disposition: A | Discharge status: Home / Self Care | Payer: Medicare Other | Source: Ambulatory Visit | Attending: Nurse Practitioner | Admitting: Nurse Practitioner

## 2010-11-01 VITALS — BP 80/60 | HR 68 | Wt 147.0 lb

## 2010-11-01 DIAGNOSIS — F419 Anxiety disorder, unspecified: Secondary | ICD-10-CM

## 2010-11-01 DIAGNOSIS — R42 Dizziness and giddiness: Secondary | ICD-10-CM | POA: Insufficient documentation

## 2010-11-01 DIAGNOSIS — I2510 Atherosclerotic heart disease of native coronary artery without angina pectoris: Secondary | ICD-10-CM

## 2010-11-01 DIAGNOSIS — F32A Depression, unspecified: Secondary | ICD-10-CM | POA: Insufficient documentation

## 2010-11-01 DIAGNOSIS — R9389 Abnormal findings on diagnostic imaging of other specified body structures: Secondary | ICD-10-CM

## 2010-11-01 DIAGNOSIS — R918 Other nonspecific abnormal finding of lung field: Secondary | ICD-10-CM

## 2010-11-01 DIAGNOSIS — F411 Generalized anxiety disorder: Secondary | ICD-10-CM

## 2010-11-01 NOTE — Patient Instructions (Signed)
We are going to check a CT scan on your chest.  Let's cut the Toprol back to just a half a tablet at night. Keep monitoring your blood pressure.  We will see you back in about 2 weeks.  Try to limit your smoking.   Dr. Larita Fife and phone number [phone]

## 2010-11-01 NOTE — Assessment & Plan Note (Signed)
Her chest pain is better and she is using less NTG. Unfortunately, she continues to smoke. Cessation was encouraged. She remains on her Plavix.

## 2010-11-01 NOTE — Assessment & Plan Note (Signed)
I have not refilled her Xanax. I have given her the name and phone number of Dr. Cyndia Bent to see if she can get established for primary care.

## 2010-11-01 NOTE — Assessment & Plan Note (Signed)
Blood pressure is low here in the office. I have cut her Metoprolol back to just half a tablet at night. Hopefully she will not have worsening chest pain. I will see her back in about 2 weeks. She will monitor her blood pressure at home.

## 2010-11-01 NOTE — Assessment & Plan Note (Signed)
She tried to get a CT of her chest with the PCP in [place] to no avail. We will send her for CT w/o contrast to follow up the abnormal CXR.

## 2010-11-01 NOTE — Progress Notes (Signed)
[name] Date of Birth: 26-Feb-1956   History of Present Illness: [name] comes in today for a one month follow up visit. She is seen for Dr. Swaziland. It is a one month check. She has had repeat stenting of the SVG to the intermediate. She does have less chest pain. She is using less NTG. She has not had her CT of the chest. Her CXR back in March reported pulmonary nodules. She tried to obtain primary care follow up but reports a poor visit. She is smoking some. She is on Welbutrin. She notes that she is very dizzy, especially with position changes. Her blood pressure at home has been running low.   Current Outpatient Prescriptions on File Prior to Visit  Medication Sig Dispense Refill  . acetaminophen (TYLENOL) 500 MG tablet Take 1 tablet (500 mg total) by mouth 4 (four) times daily.  100 tablet    . aspirin 81 MG tablet Take 81 mg by mouth daily.        Marland Kitchen buPROPion (WELLBUTRIN SR) 150 MG 12 hr tablet Take 150 mg by mouth 2 (two) times daily.        . clopidogrel (PLAVIX) 75 MG tablet Take 1 tablet (75 mg total) by mouth daily.  30 tablet  5  . levothyroxine (SYNTHROID, LEVOTHROID) 25 MCG tablet Take 1 tablet (25 mcg total) by mouth daily.  30 tablet  5  . metoprolol (TOPROL-XL) 50 MG 24 hr tablet Take 1 tablet (50 mg total) by mouth at bedtime.  30 tablet  5  . nitroGLYCERIN (NITROSTAT) 0.4 MG SL tablet Place 0.4 mg under the tongue every 5 (five) minutes as needed.        . pravastatin (PRAVACHOL) 40 MG tablet Take 1 tablet (40 mg total) by mouth daily.  5 tablet  3  . sertraline (ZOLOFT) 50 MG tablet Take 50 mg by mouth daily.        Marland Kitchen zolpidem (AMBIEN) 10 MG tablet TAKE ONE TABLET BY MOUTH EVERY DAY AT BEDTIME AS NEEDED  30 tablet  0  . ALPRAZolam (XANAX) 0.25 MG tablet Take 1 tablet (0.25 mg total) by mouth at bedtime as needed for sleep.  30 tablet  0    No Known Allergies  Past Medical History  Diagnosis Date  . Chest pain, cardiac   . Coronary artery disease   . Hypertension     . Hyperlipidemia   . Tobacco abuse   . History of sinus tachycardia   . Hypothyroidism   . Hx: UTI (urinary tract infection)     multiple  . Enterocolitis   . Hx of CABG 2002  . Presence of coronary artery bypass graft stent 2009    DES in the SVG to the intermediate  . S/P cardiac cath 2011    no intervention  . S/P cardiac cath March 2012    repeat stenting of the SVG to the intermediate    Past Surgical History  Procedure Date  . Coronary artery bypass graft 2002    History  Smoking status  . Former Smoker -- 0.3 packs/day for 30 years  . Types: Cigarettes  Smokeless tobacco  . Never Used    History  Alcohol Use No    Family History  Problem Relation Age of Onset  . Heart disease Father     Review of Systems: The review of systems is positive for anxiety. She reports some weight loss over the past few months. She has less chest pain  since her last PCI in March. She is very dizzy. Blood pressure is low. She has not had syncope.  All other systems were reviewed and are negative.  Physical Exam: BP 80/60  Pulse 68  Wt 147 lb (66.679 kg) Repeat blood pressure by me is 90/60. Patient is pleasant and in no acute distress. She is anxious. She smells of tobacco. Skin is warm and dry. Color is normal.  HEENT is unremarkable. Normocephalic/atraumatic. PERRL. Sclera are nonicteric. Neck is supple. No masses. No JVD. Lungs are clear. Cardiac exam shows a regular rate and rhythm. Abdomen is soft. Extremities are without edema. Gait and ROM are intact. No gross neurologic deficits noted.  LABORATORY DATA:  N/A   Assessment / Plan:

## 2010-11-02 ENCOUNTER — Telehealth: Payer: Self-pay | Admitting: *Deleted

## 2010-11-02 NOTE — Telephone Encounter (Signed)
LM that CT showed no pulmonary lesions but needs to quit smoking

## 2010-11-02 NOTE — Telephone Encounter (Signed)
Message copied by Murrell Redden on Thu Nov 02, 2010  4:07 PM ------      Message from: Norma Fredrickson      Created: Thu Nov 02, 2010  7:39 AM       Ok to report. CT scan shows no pulmonary nodules. Would still recommend smoking cessation.

## 2010-11-03 ENCOUNTER — Other Ambulatory Visit: Payer: Self-pay | Admitting: *Deleted

## 2010-11-10 NOTE — H&P (Signed)
[name], [name]                            ACCOUNT NO.:  [id number]   MEDICAL RECORD NO.:  [id number]                   PATIENT TYPE:  INP   LOCATION:  1825                                 FACILITY:  [facility]   PHYSICIAN:  Madolyn Frieze. Jens Som, M.D. [facility]         DATE OF BIRTH:  [birth year]   DATE OF ADMISSION:  02/21/2002  DATE OF DISCHARGE:                                HISTORY & PHYSICAL   HISTORY:  The patient is a 55 year old who presents with a chief complaint  of chest pain.  She has a history of CAD/CABG one year ago complicated by  staph bacteremia.  She has had chronic chest and right arm pain and multiple  other somatic complaints.  She presents today complaining of severe 10/10  chest pain that felt like a charley horse in her chest radiating to her neck  where she felt like her muscles in her neck were cramping and full with  sudden sharp pain.  This pain came on suddenly and was not associated with  any other symptoms.  It was relieved by lying flat and it has since been  relieved after getting three nitroglycerin and not moving.  She says the  most important thing that has happened is that she has not moved.  Moving  has made the pain worse as does deep breathing.  In the ER, she arrives pain-  free and has been not complaining of any other symptoms.  She states that  this is the same pain that occurred prior to her having bypass surgery but  she has not taken any nitroglycerin or not had any other chest pain since  her bypass surgery.   PAST MEDICAL HISTORY:  1. CAD/bypass surgery in [place] one year ago.  Apparently this was a two-     vessel bypass.  2. Chronic pain.  3. Anxiety/depression.   MEDICATIONS:  Toprol XL 100 mg q.d., aspirin 325 mg p.o. q.d., Tricor 160 mg  p.o. q.d., Flexeril 10 mg p.o. q.d., Lortab 10/500 p.o. q.4h. p.r.n., Xanax  1 mg q.a.m. q.h.s. p.r.n.   SOCIAL HISTORY:  The patient smokes one-half pack a day, down from two packs  per day  three weeks ago.   FAMILY HISTORY:  Father died from homicide.  Mother died at age 12 from  emphysema.   ALLERGIES:  No known drug allergies.   REVIEW OF SYSTEMS:  All other review of systems are negative including  HEENT, respiratory, neurologic, hematologic, endocrine, GI, etc.   LABORATORY DATA:  Today, EKG was normal sinus rhythm.  No ST or T wave  changes.  No Q waves.  Chest x-ray:  No acute process.  CK 40, MB 0.8,  troponin 0.03.  BNP is less than 30.  Sodium 138, potassium 3.7, chloride  107, bicarbonate 24, BUN 20, creatinine 0.7, glucose 103, hemoglobin 13.4,  hematocrit 39.2, platelets 281, white count 10.7, calcium 9.2, protein  6.5,  albumin 4.1, AST 15, ALT 15, alkaline phosphatase 42, bilirubin 0.04.   PHYSICAL EXAMINATION:  VITAL SIGNS:  Blood pressure is __________.  Saturation is 98% on room air.  HEENT:  No icterus.  Oropharynx and oral cavity are clear.  NECK:  No JVD, no bruits.  Normal pulses, no lymphadenopathy.  CHEST:  Clear to auscultation bilaterally with no wheezes, rales, or  rhonchi.  HEART:  Regular rate and rhythm.  No murmurs, rubs, or gallops.  No lifts.  The patient has sternotomy wound at the midline.  ABDOMEN:  Obese, no organomegaly, no rebound or guarding.  EXTREMITIES:  No cyanosis, clubbing, or edema.  Pulses 2+ bilaterally in the  distal lower extremities.  No bruits grossly.   ASSESSMENT/PLAN:  A 55 year old female status post bypass surgery one year  ago presents with atypical chest pain.  The chest pain sounds mostly  musculoskeletal especially since it is reproduced with movement; however,  since she has not had chest pain since her bypass surgery and it was  relieved at least partially by nitroglycerin, we will admit her for rule out  myocardial infarction.  If she does rule out in the next 24 hours, she could  be discharged with followup for an exercise or pharmacologic stress test.  I  would not proceed with a heart cardiac  catheterization at this time on this  patient.  We will check lipids, follow her labs, and hold on anticoagulation  at this time.  We will continue her pain medication and Xanax as she has  been taking.     Heloise Beecham, M.D. [facility]                Madolyn Frieze. Jens Som, M.D. [facility]    DWM/MEDQ  D:  02/22/2002  T:  02/23/2002  Job:  [id number]

## 2010-11-10 NOTE — Discharge Summary (Signed)
[name], [name]                            ACCOUNT NO.:  [id number]   MEDICAL RECORD NO.:  [id number]                   PATIENT TYPE:  INP   LOCATION:  2038                                 FACILITY:  [facility]   PHYSICIAN:  CBrita Romp, P.A. [facility]            DATE OF BIRTH:  05-15-1956   DATE OF ADMISSION:  02/21/2002  DATE OF DISCHARGE:  02/24/2002                                 DISCHARGE SUMMARY   DISCHARGE DIAGNOSES:  1. Chest pain, status post Adenosine Cardiolite examination.  2. Coronary artery disease, status post coronary artery bypass graft x2     approximately one year prior.  3. Hyperlipidemia.  4. Tobacco use.  5. Anxiety.   HOSPITAL COURSE:  The patient is a 55 year old female who underwent coronary  artery bypass graft surgery approximately a year ago in [place].  She  presented to the [facility] emergency room complaining of 10 out of  10 chest pain which was not relieved by nitroglycerin.  She was seen and  admitted by Heloise Beecham, M.D. [facility].  He felt that her chest pain was  atypical in nature and likely involved a musculoskeletal component.  Given  her history, he thought it was prudent to admit her and rule out myocardial  infarction with serial cardiac enzymes which were negative and obtain  Cardiolite stress test.   The next day, the patient complained of some generalized soreness in her  chest with deep inspiration.  She also reported severe right arm pain.  Dr.  Jens Som notes that the patient's chest pain as on presentation had resolved  and enzymes are serially negative.  He also noted that there was no edema on  examination of the right arm and radial pulse was 2+.   The next day, the patient was seen by Luis Abed, M.D. [facility].  The  patient stated she had awoken with pain in the right arm.  He noted the plan  for Cardiolite the following day.  He ordered cervical spine films which  showed some mild cervical spondylosis.   The  next day, the patient was seen by Dr. Jens Som.  She had no chest pain  and no shortness of breath.  Adenosine Cardiolite revealed no signs of  ischemia.  Initial reading by radiology was indeterminate.  Dr. Jens Som  reviewed the films and thought there was likely breast attenuation and that  it was a low risk study and the patient could be discharged home.   DISCHARGE MEDICATIONS:  1. Aspirin 325 mg q.d.  2. Protonix 40 mg p.o. q.d.  3. Tri-Chlor 160 mg q.d.  4. Toprol XL 100 mg q.d.  5. Wellbutrin 150 mg b.i.d.  6. Sublingual nitroglycerin as needed.  7. Flexeril, Xanax, hydrocodone as previously taken.   LABORATORY DATA:  Sodium 137, potassium 3.7, chloride 106, CO2 24, BUN 20,  creatinine 0.7, glucose 103, calcium 9.2, total  protein 6.5, albumin 4.1,  AST 15, ALT 15, alkaline phosphatase 42, total bilirubin 0.4, white count  10.7, hemoglobin 13.4, hematocrit 39.2, platelets 281.  Total cholesterol  164, triglycerides 211, HDL 36, LDL 86, total cholesterol HDL ratio 4.6.  TSH 3.967.  Electrocardiogram shows a sinus rhythm at 60, PR interval 152,  QRS 74, QTC 48, axis 24.   DISCHARGE INSTRUCTIONS:  The patient can increase her level of activity and  attempt to walk as much as 30 minutes a day.  She is to follow a low fat,  low cholesterol diet.  She is to contact [facility] for an eligibility  appointment.  She is follow up with Dr. Jens Som in the office on September  29, at 4 p.m.                                               Annett Fabian, P.A. [facility]    CKM/MEDQ  D:  02/24/2002  T:  02/25/2002  Job:  [id number]   cc:   [facility], Janene Harvey.

## 2010-11-15 ENCOUNTER — Encounter: Payer: Self-pay | Admitting: Nurse Practitioner

## 2010-11-15 ENCOUNTER — Ambulatory Visit (INDEPENDENT_AMBULATORY_CARE_PROVIDER_SITE_OTHER): Payer: Medicare Other | Admitting: Nurse Practitioner

## 2010-11-15 VITALS — BP 116/90 | HR 92 | Ht 60.0 in | Wt 143.2 lb

## 2010-11-15 DIAGNOSIS — E785 Hyperlipidemia, unspecified: Secondary | ICD-10-CM

## 2010-11-15 DIAGNOSIS — Z720 Tobacco use: Secondary | ICD-10-CM

## 2010-11-15 DIAGNOSIS — F17200 Nicotine dependence, unspecified, uncomplicated: Secondary | ICD-10-CM

## 2010-11-15 DIAGNOSIS — F419 Anxiety disorder, unspecified: Secondary | ICD-10-CM

## 2010-11-15 DIAGNOSIS — I2510 Atherosclerotic heart disease of native coronary artery without angina pectoris: Secondary | ICD-10-CM

## 2010-11-15 NOTE — Progress Notes (Signed)
[name] Date of Birth: 1955-07-16   History of Present Illness: [name] is seen back today for her 2 week visit. She is seen for Dr. Swaziland. She is doing better. She remains under some stress. She wants to go back to work. 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 She has an appointment with Dr. Cyndia Bent next week. She continues to smoke. She has stopped the Welbutrin. She didn't think it really helped her. Chantix may be an option. She notes that she is not depressed, she just gets "stressed out". She would like to get off her Zoloft. She is tired by the end of the day. She has had no significant chest pain. She is dizzy only if she stands up too quickly. Overall, she appears better. Her CT of the chest (obtained for abnormal CXR) was negative.   Current Outpatient Prescriptions on File Prior to Visit  Medication Sig Dispense Refill  . acetaminophen (TYLENOL) 500 MG tablet Take 1 tablet (500 mg total) by mouth 4 (four) times daily.  100 tablet    . ALPRAZolam (XANAX) 0.5 MG tablet Take 0.5 mg by mouth 4 (four) times daily as needed.        Marland Kitchen aspirin 81 MG tablet Take 81 mg by mouth daily.        . clopidogrel (PLAVIX) 75 MG tablet Take 1 tablet (75 mg total) by mouth daily.  30 tablet  5  . levothyroxine (SYNTHROID, LEVOTHROID) 25 MCG tablet Take 1 tablet (25 mcg total) by mouth daily.  30 tablet  5  . nitroGLYCERIN (NITROSTAT) 0.4 MG SL tablet Place 0.4 mg under the tongue every 5 (five) minutes as needed.        . pravastatin (PRAVACHOL) 40 MG tablet Take 1 tablet (40 mg total) by mouth daily.  5 tablet  3  . sertraline (ZOLOFT) 50 MG tablet Take 50 mg by mouth daily.        Marland Kitchen zolpidem (AMBIEN) 10 MG tablet TAKE ONE TABLET BY MOUTH EVERY DAY AT BEDTIME AS NEEDED  30 tablet  0  . DISCONTD: metoprolol (TOPROL-XL) 50 MG 24 hr tablet Take 1 tablet (50 mg total) by mouth at bedtime.  30 tablet  5  . ALPRAZolam (XANAX) 0.25 MG tablet Take 1 tablet (0.25 mg total) by  mouth at bedtime as needed for sleep.  30 tablet  0  . buPROPion (WELLBUTRIN SR) 150 MG 12 hr tablet Take 150 mg by mouth 2 (two) times daily. ( NOT TAKING )        No Known Allergies  Past Medical History  Diagnosis Date  . Chest pain, cardiac   . Coronary artery disease   . Hypertension   . Hyperlipidemia   . Tobacco abuse   . History of sinus tachycardia   . Hypothyroidism   . Hx: UTI (urinary tract infection)     multiple  . Enterocolitis   . Hx of CABG 2002  . Presence of coronary artery bypass graft stent 2009    DES in the SVG to the intermediate  . S/P cardiac cath 2011    no intervention  . S/P cardiac cath March 2012    repeat stenting of the SVG to the intermediate    Past Surgical History  Procedure Date  . Coronary artery bypass graft 2002    History  Smoking status  . Current Some Day Smoker -- 0.3 packs/day for 30 years  .  Types: Cigarettes  Smokeless tobacco  . Never Used    History  Alcohol Use No    Family History  Problem Relation Age of Onset  . Heart disease Father     Review of Systems: The review of systems is positive for fatigue and stress.  All other systems were reviewed and are negative.  Physical Exam: BP 116/90  Pulse 92  Ht 5' (1.524 m)  Wt 143 lb 4 oz (64.978 kg)  BMI 27.98 kg/m2 Patient is pleasant and in no acute distress. She smells of tobacco. Skin is warm and dry. Color is normal.  HEENT is unremarkable. Normocephalic/atraumatic. PERRL. Sclera are nonicteric. Neck is supple. No masses. No JVD. Lungs are clear. Cardiac exam shows a regular rate and rhythm. Abdomen is soft. Extremities are without edema. Gait and ROM are intact. No gross neurologic deficits noted.  LABORATORY DATA:  N/A   Assessment / Plan:

## 2010-11-15 NOTE — Assessment & Plan Note (Signed)
She is doing well from our standpoint. We will continue with her current regimen. I will have her see Dr. Swaziland in about 2 months. She is given the ok to return to work with no heavy lifting. Patient is agreeable to this plan and will call if any problems develop in the interim.

## 2010-11-15 NOTE — Assessment & Plan Note (Signed)
She is on Pravachol. We will check fasting labs on her return visit.

## 2010-11-15 NOTE — Assessment & Plan Note (Signed)
Smoking cessation is encouraged. She is going to discuss with Dr. Cyndia Bent about stopping the Zoloft and trying Chantix. I stressed the importance of finding other coping mechanisms (i.e., exercise) instead of Xanax and cigarettes.

## 2010-11-15 NOTE — Assessment & Plan Note (Signed)
She will discuss with Dr. Cyndia Bent at her visit next week.

## 2010-11-15 NOTE — Patient Instructions (Addendum)
Stay on your current medicines. Smoking cessation is encouraged. You may return to work, light duty We will see you back in 2 months.

## 2011-01-12 ENCOUNTER — Other Ambulatory Visit (INDEPENDENT_AMBULATORY_CARE_PROVIDER_SITE_OTHER): Payer: Medicare Other | Admitting: *Deleted

## 2011-01-12 ENCOUNTER — Ambulatory Visit (INDEPENDENT_AMBULATORY_CARE_PROVIDER_SITE_OTHER): Payer: Medicare Other | Admitting: Cardiology

## 2011-01-12 ENCOUNTER — Encounter: Payer: Self-pay | Admitting: Cardiology

## 2011-01-12 VITALS — BP 110/82 | HR 77 | Ht 60.0 in | Wt 146.8 lb

## 2011-01-12 DIAGNOSIS — E785 Hyperlipidemia, unspecified: Secondary | ICD-10-CM

## 2011-01-12 DIAGNOSIS — Z720 Tobacco use: Secondary | ICD-10-CM

## 2011-01-12 DIAGNOSIS — I2510 Atherosclerotic heart disease of native coronary artery without angina pectoris: Secondary | ICD-10-CM

## 2011-01-12 DIAGNOSIS — F17200 Nicotine dependence, unspecified, uncomplicated: Secondary | ICD-10-CM

## 2011-01-12 DIAGNOSIS — I10 Essential (primary) hypertension: Secondary | ICD-10-CM

## 2011-01-12 LAB — LIPID PANEL
Cholesterol: 194 mg/dL (ref 0–200)
HDL: 41.6 mg/dL (ref >39.00)
LDL Cholesterol: 112 mg/dL — ABNORMAL HIGH (ref 0–99)
Total CHOL/HDL Ratio: 5
Triglycerides: 200 mg/dL — ABNORMAL HIGH (ref 0.0–149.0)
VLDL: 40 mg/dL (ref 0.0–40.0)

## 2011-01-12 LAB — HEPATIC FUNCTION PANEL
ALT: 15 U/L (ref 0–35)
AST: 16 U/L (ref 0–37)
Albumin: 4.6 g/dL (ref 3.5–5.2)
Alkaline Phosphatase: 57 U/L (ref 39–117)
Bilirubin, Direct: 0 mg/dL (ref 0.0–0.3)
Total Bilirubin: 0.6 mg/dL (ref 0.3–1.2)
Total Protein: 7.7 g/dL (ref 6.0–8.3)

## 2011-01-12 LAB — BASIC METABOLIC PANEL
BUN: 12 mg/dL (ref 6–23)
CO2: 28 mEq/L (ref 19–32)
Calcium: 9.4 mg/dL (ref 8.4–10.5)
Chloride: 107 mEq/L (ref 96–112)
Creatinine, Ser: 0.8 mg/dL (ref 0.4–1.2)
GFR: 78.58 mL/min (ref >60.00)
Glucose, Bld: 97 mg/dL (ref 70–99)
Potassium: 5.2 mEq/L — ABNORMAL HIGH (ref 3.5–5.1)
Sodium: 143 mEq/L (ref 135–145)

## 2011-01-12 NOTE — Assessment & Plan Note (Signed)
We'll check fasting lab work today including chemistries and lipid panel on Pravachol.

## 2011-01-12 NOTE — Progress Notes (Signed)
[name] Date of Birth: 1956/01/02   History of Present Illness: [name] is seen back today for a followup visit. She states she has a continual chest soreness. This is chronic and at a level 2 all the time. Sometimes her pain increases to an intensity of level 7 usually associated with stress. Sometimes radiates towards her shoulders. She states this pain is different than her prior angina which was more of a spasm or sharp feeling in her chest. She has not had to take any nitroglycerin. She tried taking Chantix but experienced severe nightmares and increased irritability. She previously been intolerant to Wellbutrin. She is back taking Zoloft is also back to smoking one pack per day.  Current Outpatient Prescriptions on File Prior to Visit  Medication Sig Dispense Refill  . acetaminophen (TYLENOL) 500 MG tablet Take 1 tablet (500 mg total) by mouth 4 (four) times daily.  100 tablet    . ALPRAZolam (XANAX) 0.5 MG tablet Take 0.5 mg by mouth 6 (six) times daily.       Marland Kitchen aspirin 81 MG tablet Take 81 mg by mouth daily.        . clopidogrel (PLAVIX) 75 MG tablet Take 1 tablet (75 mg total) by mouth daily.  30 tablet  5  . levothyroxine (SYNTHROID, LEVOTHROID) 25 MCG tablet Take 1 tablet (25 mcg total) by mouth daily.  30 tablet  5  . metoprolol (TOPROL-XL) 50 MG 24 hr tablet Take 50 mg by mouth at bedtime.       . nitroGLYCERIN (NITROSTAT) 0.4 MG SL tablet Place 0.4 mg under the tongue every 5 (five) minutes as needed.        . pravastatin (PRAVACHOL) 40 MG tablet Take 1 tablet (40 mg total) by mouth daily.  5 tablet  3  . sertraline (ZOLOFT) 50 MG tablet Take 50 mg by mouth daily.        Marland Kitchen zolpidem (AMBIEN) 10 MG tablet TAKE ONE TABLET BY MOUTH EVERY DAY AT BEDTIME AS NEEDED  30 tablet  0  . ALPRAZolam (XANAX) 0.25 MG tablet Take 1 tablet (0.25 mg total) by mouth at bedtime as needed for sleep.  30 tablet  0    Allergies  Allergen Reactions  . Ranitidine     Past Medical History    Diagnosis Date  . Chest pain, cardiac   . Coronary artery disease   . Hypertension   . Hyperlipidemia   . Tobacco abuse   . History of sinus tachycardia   . Hypothyroidism   . Hx: UTI (urinary tract infection)     multiple  . Enterocolitis   . Hx of CABG 2002  . Presence of coronary artery bypass graft stent 2009    DES in the SVG to the intermediate  . S/P cardiac cath 2011    no intervention  . S/P cardiac cath March 2012    repeat stenting of the SVG to the intermediate    Past Surgical History  Procedure Date  . Coronary artery bypass graft 2002    History  Smoking status  . Current Some Day Smoker -- 0.3 packs/day for 30 years  . Types: Cigarettes  Smokeless tobacco  . Never Used    History  Alcohol Use No    Family History  Problem Relation Age of Onset  . Heart disease Father     Review of Systems: The review of systems is positive for fatigue and stress.  She does complain of a  lot of leg cramps at night.All other systems were reviewed and are negative.  Physical Exam: BP 110/82  Pulse 77  Ht 5' (1.524 m)  Wt 146 lb 12.8 oz (66.588 kg)  BMI 28.67 kg/m2 Patient is pleasant and in no acute distress. She smells of tobacco. Skin is warm and dry. Color is normal.  HEENT is unremarkable. Normocephalic/atraumatic. PERRL. Sclera are nonicteric. Neck is supple. No masses. No JVD. Lungs are clear. Cardiac exam shows a regular rate and rhythm. Abdomen is soft. Extremities are without edema. Gait and ROM are intact. No gross neurologic deficits noted.  LABORATORY DATA:  N/A   Assessment / Plan:

## 2011-01-12 NOTE — Patient Instructions (Signed)
I recommend using nicotine replacement to help you quit smoking.  We will call with the results of your lab work.  Try and get regular walking in.  I will see you again in 3 months.

## 2011-01-12 NOTE — Assessment & Plan Note (Signed)
She is having chronic chest pain symptoms are atypical for angina. I think these symptoms are more musculoskeletal and are psychosomatic. We will continue with aspirin and Plavix. She is on beta blocker therapy. We've stressed importance of smoking cessation.

## 2011-01-12 NOTE — Assessment & Plan Note (Signed)
She has failed a chest x-ray and Wellbutrin because of tolerance issues. I recommended nicotine replacement products and continued efforts at smoking cessation.

## 2011-01-12 NOTE — Assessment & Plan Note (Signed)
Previously her metoprolol was reduced to 25 mg per day but her blood pressure increased so she went back to 50 mg a day. Her blood pressure appears to be well controlled today.

## 2011-01-15 ENCOUNTER — Encounter: Payer: Self-pay | Admitting: Cardiology

## 2011-01-16 ENCOUNTER — Telehealth: Payer: Self-pay | Admitting: *Deleted

## 2011-01-16 DIAGNOSIS — E7800 Pure hypercholesterolemia, unspecified: Secondary | ICD-10-CM

## 2011-01-16 NOTE — Progress Notes (Signed)
lm

## 2011-01-16 NOTE — Telephone Encounter (Signed)
Message copied by Lorayne Bender on Tue Jan 16, 2011  3:07 PM ------      Message from: [place], PETER M      Created: Fri Jan 12, 2011  4:52 PM       Chemistries are OK. LDL is not at goal. I would increase Pravachol to 80 mg daily. Repeat lab in 3 months.

## 2011-01-16 NOTE — Telephone Encounter (Signed)
Notified of lab results. Will repeat labs at next visit in Oct, will send copy to Dr. Cyndia Bent

## 2011-03-08 ENCOUNTER — Emergency Department (HOSPITAL_COMMUNITY): Payer: Medicare Other

## 2011-03-08 ENCOUNTER — Inpatient Hospital Stay (HOSPITAL_COMMUNITY)
Admission: EM | Admit: 2011-03-08 | Discharge: 2011-03-10 | DRG: 247 | Disposition: A | Discharge status: Home / Self Care | Payer: Medicare Other | Source: Ambulatory Visit | Attending: Cardiology | Admitting: Cardiology

## 2011-03-08 DIAGNOSIS — I2510 Atherosclerotic heart disease of native coronary artery without angina pectoris: Principal | ICD-10-CM | POA: Diagnosis present

## 2011-03-08 DIAGNOSIS — F3289 Other specified depressive episodes: Secondary | ICD-10-CM | POA: Diagnosis present

## 2011-03-08 DIAGNOSIS — R079 Chest pain, unspecified: Secondary | ICD-10-CM

## 2011-03-08 DIAGNOSIS — F329 Major depressive disorder, single episode, unspecified: Secondary | ICD-10-CM | POA: Diagnosis present

## 2011-03-08 DIAGNOSIS — I10 Essential (primary) hypertension: Secondary | ICD-10-CM | POA: Diagnosis present

## 2011-03-08 DIAGNOSIS — Z79899 Other long term (current) drug therapy: Secondary | ICD-10-CM

## 2011-03-08 DIAGNOSIS — Z7982 Long term (current) use of aspirin: Secondary | ICD-10-CM

## 2011-03-08 DIAGNOSIS — I2582 Chronic total occlusion of coronary artery: Secondary | ICD-10-CM | POA: Diagnosis present

## 2011-03-08 DIAGNOSIS — E785 Hyperlipidemia, unspecified: Secondary | ICD-10-CM | POA: Diagnosis present

## 2011-03-08 DIAGNOSIS — Z9861 Coronary angioplasty status: Secondary | ICD-10-CM

## 2011-03-08 DIAGNOSIS — I200 Unstable angina: Secondary | ICD-10-CM | POA: Diagnosis present

## 2011-03-08 DIAGNOSIS — E7800 Pure hypercholesterolemia, unspecified: Secondary | ICD-10-CM | POA: Diagnosis present

## 2011-03-08 DIAGNOSIS — I25810 Atherosclerosis of coronary artery bypass graft(s) without angina pectoris: Secondary | ICD-10-CM | POA: Diagnosis present

## 2011-03-08 DIAGNOSIS — F17200 Nicotine dependence, unspecified, uncomplicated: Secondary | ICD-10-CM | POA: Diagnosis present

## 2011-03-08 DIAGNOSIS — E039 Hypothyroidism, unspecified: Secondary | ICD-10-CM | POA: Diagnosis present

## 2011-03-08 DIAGNOSIS — Z7902 Long term (current) use of antithrombotics/antiplatelets: Secondary | ICD-10-CM

## 2011-03-08 DIAGNOSIS — Z8249 Family history of ischemic heart disease and other diseases of the circulatory system: Secondary | ICD-10-CM

## 2011-03-08 LAB — COMPREHENSIVE METABOLIC PANEL
Albumin: 3.7 g/dL (ref 3.5–5.2)
Alkaline Phosphatase: 63 U/L (ref 39–117)
BUN: 19 mg/dL (ref 6–23)
CO2: 26 mEq/L (ref 19–32)
Chloride: 104 mEq/L (ref 96–112)
Creatinine, Ser: 0.5 mg/dL (ref 0.50–1.10)
GFR calc non Af Amer: >60 mL/min (ref >60)
Potassium: 3.9 mEq/L (ref 3.5–5.1)
Total Bilirubin: 0.2 mg/dL — ABNORMAL LOW (ref 0.3–1.2)

## 2011-03-08 LAB — HEMOGLOBIN A1C
Hgb A1c MFr Bld: 5.7 % — ABNORMAL HIGH (ref <5.7)
Mean Plasma Glucose: 117 mg/dL — ABNORMAL HIGH (ref <117)

## 2011-03-08 LAB — DIFFERENTIAL
Basophils Relative: 0 % (ref 0–1)
Lymphocytes Relative: 38 % (ref 12–46)
Lymphs Abs: 3.5 10*3/uL (ref 0.7–4.0)
Monocytes Absolute: 0.4 10*3/uL (ref 0.1–1.0)
Monocytes Relative: 4 % (ref 3–12)
Neutro Abs: 5 10*3/uL (ref 1.7–7.7)
Neutrophils Relative %: 54 % (ref 43–77)

## 2011-03-08 LAB — CBC
HCT: 38.9 % (ref 36.0–46.0)
Hemoglobin: 13.7 g/dL (ref 12.0–15.0)
MCH: 32.1 pg (ref 26.0–34.0)
MCHC: 35.2 g/dL (ref 30.0–36.0)
MCV: 91.1 fL (ref 78.0–100.0)
RBC: 4.27 MIL/uL (ref 3.87–5.11)

## 2011-03-08 LAB — POCT I-STAT TROPONIN I: Troponin i, poc: 0.01 ng/mL (ref 0.00–0.08)

## 2011-03-08 LAB — LIPID PANEL
Cholesterol: 150 mg/dL (ref 0–200)
Triglycerides: 231 mg/dL — ABNORMAL HIGH (ref <150)

## 2011-03-08 LAB — APTT: aPTT: 30 seconds (ref 24–37)

## 2011-03-08 LAB — MRSA PCR SCREENING: MRSA by PCR: NEGATIVE

## 2011-03-08 LAB — CK TOTAL AND CKMB (NOT AT ARMC): Total CK: 49 U/L (ref 7–177)

## 2011-03-08 LAB — PROTIME-INR: INR: 0.92 (ref 0.00–1.49)

## 2011-03-08 LAB — CARDIAC PANEL(CRET KIN+CKTOT+MB+TROPI)
CK, MB: 1.9 ng/mL (ref 0.3–4.0)
Troponin I: <0.3 ng/mL (ref <0.30)

## 2011-03-08 LAB — TSH: TSH: 2.878 u[IU]/mL (ref 0.350–4.500)

## 2011-03-09 DIAGNOSIS — I25810 Atherosclerosis of coronary artery bypass graft(s) without angina pectoris: Secondary | ICD-10-CM

## 2011-03-09 LAB — CBC
HCT: 39.1 % (ref 36.0–46.0)
Hemoglobin: 13.5 g/dL (ref 12.0–15.0)
MCV: 92 fL (ref 78.0–100.0)
RBC: 4.25 MIL/uL (ref 3.87–5.11)
WBC: 7.8 10*3/uL (ref 4.0–10.5)

## 2011-03-10 DIAGNOSIS — I200 Unstable angina: Secondary | ICD-10-CM

## 2011-03-10 LAB — BASIC METABOLIC PANEL
BUN: 12 mg/dL (ref 6–23)
Chloride: 105 mEq/L (ref 96–112)
Glucose, Bld: 84 mg/dL (ref 70–99)
Potassium: 4.3 mEq/L (ref 3.5–5.1)

## 2011-03-10 LAB — CBC
HCT: 37.8 % (ref 36.0–46.0)
MCH: 31.6 pg (ref 26.0–34.0)
MCV: 91.1 fL (ref 78.0–100.0)
RBC: 4.15 MIL/uL (ref 3.87–5.11)
WBC: 7.6 10*3/uL (ref 4.0–10.5)

## 2011-03-10 NOTE — H&P (Signed)
NAMEMarland Flynn  OLAMAE, FERRARA NO.:  [id number]  MEDICAL RECORD NO.:  [id number]  LOCATION:  [facility]                         FACILITY:  [facility]  PHYSICIAN:  Lenon Oms, MD  DATE OF BIRTH:  [birth year]  DATE OF ADMISSION:  03/08/2011 DATE OF DISCHARGE:                             HISTORY & PHYSICAL   CARDIOLOGIST:  Peter M. Swaziland, MD  PRIMARY CARE PHYSICIAN:  Jamey Reas, MD  CHIEF COMPLAINT:  Chest pain.  HISTORY OF PRESENT ILLNESS:  [name] is a 55 year old female with past medical history significant for coronary artery disease status post CABG.  The patient last had cardiac catheterization in March 2012 where she underwent drug-eluting stent and SVG to the ramus September 08, 2010, who presents today with a chief complaint of chest pain.  The patient reports that she has had intermittent chest pain for the last 3 days.  She described this as 10/10 burning chest pain, which initially woke her up from night approximately 3 days prior.  She states that her chest pain radiates to her arms and neck bilaterally.  She has felt weak for the last several days along with associated shortness of breath and diaphoresis.  She reports that her chest pain is worse with exertion. She has tried to take nitroglycerin, which has given her some relief, however, has continued to have persistent symptoms.  She feels that this episode is worse than her prior episodes in the past.  She states that she is noncompliant with her medical therapy, however, does continue to smoke cigarettes.  PAST MEDICAL HISTORY: 1. Coronary artery disease status post CABG with SVG to the OM and SVG     to ramus in [place]. 2. Cardiac catheterization on September 08, 2010, revealed preserved LV     function, EF of 60%, left main had ostial narrowing of about 30%,     LAD 30% to 40% proximal lesion, saphenous vein graft to large ramus     vessel with high-grade lesion at that time, which was  stented. 3. Hypertension. 4. Hyperlipidemia. 5. History of hypothyroidism. 6. Tobacco abuse. 7. History of multiple UTIs.  ALLERGIES:  No known drug allergies.  MEDICATIONS: 1. Aspirin 81 mg daily. 2. Plavix 75 mg daily. 3. Toprol-XL 50 mg at night. 4. [place] 75 mcg daily. 5. Xanax p.r.n. for anxiety. 6. Pravachol 40 mg daily. 7. Nitroglycerin p.r.n. 8. Ambien 10 mg p.r.n.  SOCIAL HISTORY:  The patient lives in [place] alone.  She is currently unemployed, however, does take care of her granddaughter.  She does continue to smoke at least four cigarettes a day.  She has smoked for 23 years.  She denies any current alcohol or illicit drug use.  FAMILY HISTORY:  Significant for coronary artery disease in her sister, brother has hypertension.  REVIEW OF SYSTEMS:  Other than stated in HPI, all other systems were reviewed and negative.  PHYSICAL EXAMINATION:  VITAL SIGNS:  Afebrile, vital signs stable. GENERAL:  She is in no acute distress. HEENT:  Normocephalic, atraumatic.  Pupils equally round and reactive to light.  Extraocular movements intact.  Anicteric sclerae. NECK:  Supple.  No JVD. HEART:  Regular rate and rhythm, normal S1 and S2.  Well-healed sternotomy scar. LUNGS:  Clear to auscultation bilaterally. ABDOMEN:  Normoactive bowel sounds, soft, nontender, nondistended. EXTREMITIES:  No cyanosis, clubbing, or edema. NEUROLOGICALLY:  Awake, alert and oriented x3.  No focal deficits.  RADIOLOGY: 1. The patient had a chest x-ray, which revealed mild bibasilar     scarring. 2. The patient had EKG, which revealed sinus rhythm, heart rate of 86     with a nonspecific T-wave changes, which is unchanged from prior     EKG in March 2012, normal intervals.  LABORATORY DATA:  The patient had a CBC profile, which revealed a white count of 9.3, hemoglobin of 13.7, hematocrit of 38.9, platelet count of 219.  Comprehensive metabolic profile reveal a sodium of 138,  potassium 3.9, BUN of 19, creatinine is 2.5, troponin-I was 0.01.  ASSESSMENT AND PLAN:  [name] is a 55 year old female with past medical history significant for coronary artery disease status post coronary artery bypass grafting, status post recent percutaneous transluminal coronary angioplasty with a PROMUS drug-eluting stent and saphenous vein graft to the ramus in March 2012, hypertension, hyperlipidemia, and continued tobacco abuse who presents today with chief complaint of chest pain worse with exertion and relieved with nitroglycerin with no acute changes in EKG and normal cardiac enzymes. The patient will be admitted to [facility] for unstable angina.  PLAN:  The patient will be placed on telemetry and have serial cardiac biomarkers to rule out acute MI. Her medical management, we will continue her aspirin, Plavix, heparin, beta-blocker, statin, and nitroglycerin p.r.n.  May need to consider adding long-acting nitrates to her medical regimen.  We will keep n.p.o. except for medications at this time.  Discussed with her tobacco cessation.  At this time, the patient's presentation not necessarily consistent with in-stent thrombosis as she did have recent PCI.  Further plan will be determine depending on clinical biomarkers and reassessment in a.m.  We will consider possible further ischemic evaluation with stress versus cath. I have discussed this plan in detail with the patient and have answered all her questions at this time.          ______________________________ Lenon Oms, MD     PB/MEDQ  D:  03/08/2011  T:  03/08/2011  Job:  [id number]  Electronically Signed by Lenon Oms MD on 03/10/2011 06:04:10 PM

## 2011-04-02 ENCOUNTER — Ambulatory Visit: Payer: Medicare Other | Admitting: Cardiology

## 2011-04-02 ENCOUNTER — Other Ambulatory Visit: Payer: Medicare Other | Admitting: *Deleted

## 2011-04-02 ENCOUNTER — Telehealth: Payer: Self-pay | Admitting: Cardiology

## 2011-04-02 NOTE — Telephone Encounter (Signed)
Pt calls to cancel her appt with Dr. Swaziland today.  She has had chest pain which was relieved by 3 sl NTG.  She says  "there is nothing else they can do for me. I have had cabg twice and stents".  "I just do not feel well and I am going to lay down"  Pt is not alone. Her son is at home with her.  She says" there is no other form of treatment or medicine they can do for me".  " I do not want to be on life support, I know that once the bottom part of my heart goes there is nothing else that can be done for me". I have not cancelled pt appt yet in case she does want to see Dr. Swaziland today. She will call 911 only if "the pain becomes too excruciating"   Mylo Red RN

## 2011-04-02 NOTE — Telephone Encounter (Signed)
Pt calling c/o chest pain it started this am when pt got up and started getting ready and in the shower, on her way to Dr. Swaziland office for appt today at 10:45. Pt c/o a little sob. Pt jaw feels like it is locking up and pain is shooting up both sides of neck, pt also c/o head pain (headache).  Pt has taken 3 nitro, with little relief.   Pt call was transferred to triage.

## 2011-04-02 NOTE — Telephone Encounter (Signed)
Called in response to call this AM. Have tried multiple times to reach her at [phone] and message says "not receiving calls". Finally at 5:15 got in touch w/son. He states she is feeling better now. Advised him to let her know that if she is continuing to have CP she does need to go to ER. He states she was told that "no more metal could be put in her body" so she doesn't feel like it would benefit her to go to ER. Per Dr. Swaziland he did not tell her that and if she is having CP to go to ER. He will relay message to her.

## 2011-04-02 NOTE — Telephone Encounter (Signed)
Phone not accepting calls

## 2011-04-05 NOTE — Cardiovascular Report (Signed)
[name], [name]                ACCOUNT NO.:  [id number]  MEDICAL RECORD NO.:  [id number]  LOCATION:  2925                         FACILITY:  [facility]  PHYSICIAN:  Arturo Morton. Riley Kill, MD, FACCDATE OF BIRTH:  1956/06/22  DATE OF PROCEDURE:  03/09/2011 DATE OF DISCHARGE:                           CARDIAC CATHETERIZATION   INDICATIONS:  This is a very pleasant 55 year old woman with a history of coronary artery disease.  In 2002, she underwent bypass surgery.  She then had a saphenous vein graft stent placement in [place] as well. She presented a few months ago at [facility], and had high- grade narrowing both proximal and distal aspects of the single vein graft to the large ramus intermedius, but was symptomatic.  This was successfully stented with a nice angiographic result.  She unfortunately continued to smoke.  She did well for about 3-4 months, but then has developed recurrent anginal-type chest pain which is worsened over the past few days.  Repeat cardiac catheterization was recommended because of unstable angina.  Dr. Swaziland saw the patient and risks and benefits were discussed.  She agreed to proceed.  PROCEDURES: 1. Placement of catheters without left heart catheterization. 2. Selective coronary arteriography. 3. Saphenous vein graft angiography. 4. Percutaneous stenting of the native ramus intermedius subbranch     using a 2.25 x 32 Promus Element drug-eluting stent postdilated to     2.5 mm.  DESCRIPTION OF PROCEDURE:  The patient was brought to the catheterization laboratory, prepped and draped in usual fashion.  We had difficulty accessing the right femoral arteries.  The left femoral artery was utilized.  We used a Smart needle.  Views of the left and right coronary arteries were obtained.  The vein graft was injected. The patient was noted to have now occlusion of the vein graft, but flow down in the native vessel was present although there was a long  area of severe stenosis.  Dr. Clifton James and I reviewed the films and after a thorough discussion given the patient's ongoing chest pain, it was felt that an attempt percutaneous intervention was warranted.  The patient had chest pain in the cath lab.  I spoke with her family.  Her 5-French sheath was then upgraded to a 6-French sheath and bivalirudin was given according to the protocol.  We were able to get a Cougar wire down into the distal vessel, and a 2-mm balloon was used to predilate.  There was fairly marked improvement in the appearance of the artery, although there was an area of dissection in the area of dilatation.  After thoughtful discussion with Dr. Clifton James, I elected to place a 2.25 x 32 Promus Element stent with its distal placement just beyond where the graft comes in to the vessel.  The stent was carefully placed, and then deployed at approximately 13 atmospheres.  We then postdilated using a 2.5-mm noncompliant [place] Trek balloon.  Postdilatation was done throughout the stent.  I debated whether to place another stent more proximally because of the 2-mm residual vessel, but after again a thorough discussion, we elected to leave this alone since the whole area appeared to be relatively smooth with good flow.  There was marked improvement appearance of the artery and resolution of the patient's chest pain. With this, all catheters were subsequently removed.  The femoral sheath was sewn into place.  There were no major complications and she tolerated the procedure reasonably well other than chest pain every time we dilated the lesion.  I then reviewed the films in detail with her sons.  She was taken to the holding area in satisfactory clinical condition.  HEMODYNAMIC DATA:  Central aortic pressure 100/65 with a mean of 78.  ANGIOGRAPHIC DATA: 1. The left main is free of critical disease. 2. The left anterior descending artery has about 40% segmental plaque     proximally  and then courses to the apex where it tapers rather     substantially.  Critical stenosis is not noted. 3. There is a large ramus intermedius that has a superior and an     inferior branch.  The superior branch is without critical disease.     The inferior branch has a long area of subtotal occlusion of 95%     narrowing.  More proximal aspect of the vessel has probably 30-40%     narrowing.  Down distally, there was a retrograde filling of the     vein graft which is noted to be clotted.  The distal vessel, after     vasodilatation, is demonstrated to be relatively large. 4. The AV circumflex provides a large marginal system, is free of     critical disease. 5. The right coronary artery is markedly tortuous with a posterior     descending and posterolateral branch and has minor luminal     irregularity, but no critical stenoses. 6. The inferior subbranch of the ramus intermedius was subtotally     occluded prior to intervention.  After intervention, it is reduced     to 0% residual luminal narrowing with excellent transition in to     the distal vessel beyond the graft insertion. There is a long area     of segmental narrowing of 30-40% proximal to the stent, but after     thorough discussion, we elected not to stent this as well.  CONCLUSIONS: 1. Successful percutaneous stenting of native ramus intermedius. 2. Occlusion of the saphenous vein graft to the ramus intermedius. 3. Other findings as noted above.  DISPOSITION: 1. We will check a P2Y12 inhibition. 2. Discontinuation of smoking is strongly advised. 3. Followup will be with Dr. Peter Swaziland. 4. Continuous dual-antiplatelet therapy would be recommended as long     as the patient can continue this without risk.     Arturo Morton. Riley Kill, MD, [facility]     TDS/MEDQ  D:  03/09/2011  T:  03/10/2011  Job:  [id number]  cc:   CV Laboratory Jamey Reas, MD  Electronically Signed by Shawnie Pons MD [facility] on 04/05/2011  05:42:41 AM

## 2011-04-05 NOTE — Discharge Summary (Signed)
[name], [name]                ACCOUNT NO.:  [id number]  MEDICAL RECORD NO.:  [id number]  LOCATION:  2925                         FACILITY:  [facility]  PHYSICIAN:  Doylene Canning. Ladona Ridgel, MD    DATE OF BIRTH:  1955/12/26  DATE OF ADMISSION:  03/08/2011 DATE OF DISCHARGE:  03/10/2011                              DISCHARGE SUMMARY   PROCEDURES: 1. Placement of catheters without left heart catheterization. 2. Coronary arteriogram. 3. Saphenous vein angiogram. 4. Percutaneous transluminal coronary angioplasty and 2.25 x 32-mm     Promus drug-eluting stent to the native ramus intermedius     subbranch.  PRIMARY FINAL DISCHARGE DIAGNOSIS:  Acute coronary syndrome.  SECONDARY DIAGNOSES: 1. Dyslipidemia with a total cholesterol of 150, triglycerides 231,     HDL 40, LDL 64 this admission. 2. History of aortocoronary bypass surgery in [place] with saphenous     vein graft to the large ramus, second graft unclear, possibly left     internal mammary artery, but this vessel was atretic. 3. Remote history of stenting to the ramus intermedius. 4. Status post drug-eluting stent x2 to the saphenous vein graft to     ramus in March 2012. 5. Preserved left ventricular function with an ejection fraction of     60% at catheterization in March 2012. 6. Hypertension. 7. History of hypothyroidism. 8. Ongoing tobacco abuse. 9. History of urinary tract infections. 10.Family history of coronary artery disease in her sister.  TIME AT DISCHARGE:  34 minutes.  HOSPITAL COURSE:  [name] is a 55 year old female with a history of coronary artery disease.  She had chest pain and came to the hospital where she was admitted for further evaluation.  Her cardiac enzymes were negative for MI.  Lipid profile as described above.  TSH was within normal limits at 2.878.  Hemoglobin A1c was 5.7. She was taken to the Cath Lab on March 09, 2011.  The cardiac catheterization showed LAD 40%, ramus superior  branch patent, inferior branch 95% which was treated with PTCA and stenting reducing the stenosis to 0.  The SVG to ramus intermedius was clotted.  The AV circumflex and large marginal system had no critical disease and the RCA had luminal irregularities.  On March 10, 2011, [name] was seen by Dr. Ladona Ridgel.  She is considered stable for discharge, to follow up as an outpatient.  DISCHARGE INSTRUCTIONS: 1. Her activity level is to be increased gradually. 2. She is encouraged to stick to a low-sodium, heart-healthy diet. 3. She is to call the office for problems with the cath site. 4. She is to follow up with Dr. Swaziland and our office will call her     with an appointment. 5. She is to follow up with Dr. Cyndia Bent as needed.  DISCHARGE MEDICATIONS: 1. Effexor 75 mg b.i.d. 2. Sublingual nitroglycerin p.r.n. 3. Xanax 1 mg q.i.d. p.r.n. 4. Toprol-XL 50 mg daily. 5. Crestor 10 mg daily. 6. Aspirin 81 mg is changed from 3 tablets to 1 tablet daily. 7. Phenergan 25 mg q.6 h. p.r.n. 8. Plavix 75 mg daily. 9. Synthroid 25 mcg daily.     Christina Demark, PA-C   ______________________________ Doylene Canning.  Ladona Ridgel, MD    RB/MEDQ  D:  03/10/2011  T:  03/10/2011  Job:  [id number]  cc:   Eartha Inch, MD  Electronically Signed by Christina Demark PA-C on 04/02/2011 06:35:19 AM Electronically Signed by Lewayne Bunting MD on 04/05/2011 06:48:15 PM

## 2011-04-09 LAB — URINALYSIS, ROUTINE W REFLEX MICROSCOPIC
Bilirubin Urine: NEGATIVE
Glucose, UA: NEGATIVE
Hgb urine dipstick: NEGATIVE
Ketones, ur: NEGATIVE
Protein, ur: NEGATIVE

## 2011-04-09 LAB — DIFFERENTIAL
Basophils Relative: 0
Lymphs Abs: 2.5
Monocytes Relative: 2 — ABNORMAL LOW
Neutro Abs: 9.8 — ABNORMAL HIGH
Neutrophils Relative %: 78 — ABNORMAL HIGH

## 2011-04-09 LAB — ROCKY MTN SPOTTED FVR AB, IGG-BLOOD: RMSF IgG: 1.09 {ISR} — ABNORMAL HIGH

## 2011-04-09 LAB — URINE CULTURE: Colony Count: 2000

## 2011-04-09 LAB — COMPREHENSIVE METABOLIC PANEL
Alkaline Phosphatase: 63
BUN: 10
Calcium: 9.7
Creatinine, Ser: 0.67
Glucose, Bld: 104 — ABNORMAL HIGH
Total Protein: 7.4

## 2011-04-09 LAB — CBC
HCT: 44.6
Hemoglobin: 15.4 — ABNORMAL HIGH
MCHC: 34.6
MCV: 90.5
RDW: 12.9

## 2011-04-09 LAB — ROCKY MTN SPOTTED FVR AB, IGM-BLOOD: RMSF IgM: 0.17

## 2011-04-09 LAB — CULTURE, BLOOD (ROUTINE X 2)
Culture: NO GROWTH
Culture: NO GROWTH

## 2011-04-18 ENCOUNTER — Emergency Department (HOSPITAL_COMMUNITY): Payer: Medicare Other

## 2011-04-18 ENCOUNTER — Inpatient Hospital Stay (HOSPITAL_COMMUNITY)
Admission: EM | Admit: 2011-04-18 | Discharge: 2011-04-20 | DRG: 287 | Disposition: A | Discharge status: Home / Self Care | Payer: Medicare Other | Source: Ambulatory Visit | Attending: Internal Medicine | Admitting: Internal Medicine

## 2011-04-18 DIAGNOSIS — I25810 Atherosclerosis of coronary artery bypass graft(s) without angina pectoris: Secondary | ICD-10-CM | POA: Diagnosis present

## 2011-04-18 DIAGNOSIS — Z87440 Personal history of urinary (tract) infections: Secondary | ICD-10-CM

## 2011-04-18 DIAGNOSIS — R0789 Other chest pain: Principal | ICD-10-CM | POA: Diagnosis present

## 2011-04-18 DIAGNOSIS — I9589 Other hypotension: Secondary | ICD-10-CM | POA: Diagnosis not present

## 2011-04-18 DIAGNOSIS — R079 Chest pain, unspecified: Secondary | ICD-10-CM

## 2011-04-18 DIAGNOSIS — Z7982 Long term (current) use of aspirin: Secondary | ICD-10-CM

## 2011-04-18 DIAGNOSIS — E785 Hyperlipidemia, unspecified: Secondary | ICD-10-CM | POA: Diagnosis present

## 2011-04-18 DIAGNOSIS — F411 Generalized anxiety disorder: Secondary | ICD-10-CM | POA: Diagnosis present

## 2011-04-18 DIAGNOSIS — Z9861 Coronary angioplasty status: Secondary | ICD-10-CM

## 2011-04-18 DIAGNOSIS — F17200 Nicotine dependence, unspecified, uncomplicated: Secondary | ICD-10-CM | POA: Diagnosis present

## 2011-04-18 DIAGNOSIS — Z7902 Long term (current) use of antithrombotics/antiplatelets: Secondary | ICD-10-CM

## 2011-04-18 DIAGNOSIS — I10 Essential (primary) hypertension: Secondary | ICD-10-CM | POA: Diagnosis present

## 2011-04-18 DIAGNOSIS — I2510 Atherosclerotic heart disease of native coronary artery without angina pectoris: Secondary | ICD-10-CM | POA: Diagnosis present

## 2011-04-18 DIAGNOSIS — Z79899 Other long term (current) drug therapy: Secondary | ICD-10-CM

## 2011-04-18 DIAGNOSIS — T448X5A Adverse effect of centrally-acting and adrenergic-neuron-blocking agents, initial encounter: Secondary | ICD-10-CM | POA: Diagnosis not present

## 2011-04-18 DIAGNOSIS — E039 Hypothyroidism, unspecified: Secondary | ICD-10-CM | POA: Diagnosis present

## 2011-04-18 LAB — CK TOTAL AND CKMB (NOT AT ARMC)
CK, MB: 1.5 ng/mL (ref 0.3–4.0)
Relative Index: INVALID (ref 0.0–2.5)

## 2011-04-18 LAB — DIFFERENTIAL
Basophils Absolute: 0 10*3/uL (ref 0.0–0.1)
Basophils Relative: 0 % (ref 0–1)
Eosinophils Absolute: 0.3 10*3/uL (ref 0.0–0.7)
Lymphocytes Relative: 44 % (ref 12–46)
Lymphs Abs: 3.8 10*3/uL (ref 0.7–4.0)
Monocytes Relative: 4 % (ref 3–12)

## 2011-04-18 LAB — CBC
Hemoglobin: 13.6 g/dL (ref 12.0–15.0)
MCH: 32.4 pg (ref 26.0–34.0)
Platelets: 224 10*3/uL (ref 150–400)
RBC: 4.2 MIL/uL (ref 3.87–5.11)
WBC: 8.7 10*3/uL (ref 4.0–10.5)

## 2011-04-18 LAB — POCT I-STAT TROPONIN I: Troponin i, poc: 0 ng/mL (ref 0.00–0.08)

## 2011-04-18 LAB — BASIC METABOLIC PANEL
CO2: 25 mEq/L (ref 19–32)
Calcium: 9.6 mg/dL (ref 8.4–10.5)
Glucose, Bld: 119 mg/dL — ABNORMAL HIGH (ref 70–99)
Sodium: 137 mEq/L (ref 135–145)

## 2011-04-19 ENCOUNTER — Inpatient Hospital Stay (HOSPITAL_COMMUNITY): Payer: Medicare Other

## 2011-04-19 DIAGNOSIS — I2510 Atherosclerotic heart disease of native coronary artery without angina pectoris: Secondary | ICD-10-CM

## 2011-04-19 LAB — BASIC METABOLIC PANEL
BUN: 13 mg/dL (ref 6–23)
CO2: 23 mEq/L (ref 19–32)
Chloride: 109 mEq/L (ref 96–112)
Creatinine, Ser: 0.63 mg/dL (ref 0.50–1.10)

## 2011-04-19 LAB — PROTIME-INR
INR: 1.05 (ref 0.00–1.49)
Prothrombin Time: 13.9 seconds (ref 11.6–15.2)

## 2011-04-19 LAB — CBC
HCT: 34.4 % — ABNORMAL LOW (ref 36.0–46.0)
Hemoglobin: 11.8 g/dL — ABNORMAL LOW (ref 12.0–15.0)
MCV: 92.5 fL (ref 78.0–100.0)
RBC: 3.72 MIL/uL — ABNORMAL LOW (ref 3.87–5.11)
WBC: 6.1 10*3/uL (ref 4.0–10.5)

## 2011-04-19 LAB — POCT ACTIVATED CLOTTING TIME: Activated Clotting Time: 105 seconds

## 2011-04-19 LAB — TROPONIN I: Troponin I: <0.3 ng/mL (ref <0.30)

## 2011-04-20 ENCOUNTER — Inpatient Hospital Stay (HOSPITAL_COMMUNITY): Payer: Medicare Other

## 2011-04-20 LAB — BASIC METABOLIC PANEL
CO2: 26 mEq/L (ref 19–32)
GFR calc non Af Amer: >90 mL/min (ref >90)
Glucose, Bld: 120 mg/dL — ABNORMAL HIGH (ref 70–99)
Potassium: 3.7 mEq/L (ref 3.5–5.1)
Sodium: 141 mEq/L (ref 135–145)

## 2011-04-20 LAB — CBC
Hemoglobin: 11.6 g/dL — ABNORMAL LOW (ref 12.0–15.0)
MCHC: 34 g/dL (ref 30.0–36.0)
Platelets: 160 10*3/uL (ref 150–400)
RBC: 3.7 MIL/uL — ABNORMAL LOW (ref 3.87–5.11)

## 2011-04-21 NOTE — Cardiovascular Report (Signed)
  NAMEALYZZA, ANDRINGA NO.:  [id number]  MEDICAL RECORD NO.:  [id number]  LOCATION:  2507                         FACILITY:  [facility]  PHYSICIAN:  Peter M. Swaziland, M.D.  DATE OF BIRTH:  Jul 28, 1955  DATE OF PROCEDURE:  04/19/2011 DATE OF DISCHARGE:                           CARDIAC CATHETERIZATION   INDICATION FOR PROCEDURE:  A 55 year old white female with remote history of coronary artery bypass surgery.  She subsequently had multiple stent procedures for the saphenous vein graft to the intermediate.  On her last evaluation, this graft had occluded.  Her LIMA graft to the LAD was atretic.  She had single-vessel disease and underwent stenting of the ramus intermediate branch 1 month ago.  She now presents with recurrent and increasing chest pain.  PROCEDURE:  Left heart catheterization, coronary and left ventricular angiography.  Access is via the right femoral artery using standard Seldinger technique.  EQUIPMENT:  5-French 4-cm right and left Judkins catheter, 5-French pigtail catheter, 5-French arterial sheath.  MEDICATIONS:  Versed 2 mg IV, fentanyl 50 mcg IV.  CONTRAST:  80 mL of Omnipaque.  HEMODYNAMIC DATA:  Aortic pressure is 137/76 with a mean of 100 mmHg, left ventricular pressure is 138 with EDP of 14 mmHg.  ANGIOGRAPHIC DATA:  The left coronary artery arises and distributes normally.  There is mild irregularity in the left main coronary artery less than 20%.  The left anterior descending artery appears normal.  It does taper distally to a small caliber vessel, but no significant stenoses were seen.  The ramus intermediate branch is widely patent at its prior stent site. There is only 20% irregularity proximal to the stent.  There is TIMI grade 3 flow.  Left circumflex coronary artery is normal.  The right coronary artery arises and distributes normally.  It is a normal vessel.  We did not perform graft angiography since these were known  to be occluded.  Left ventricular angiography was performed in the RAO view.  This demonstrates normal left ventricular size and contractility with overall ejection fraction of 60%.  FINAL INTERPRETATION: 1. Minimal nonobstructive atherosclerotic coronary artery disease.     The stent in the ramus intermediate branch is still widely patent. 2. Normal left ventricular function.          ______________________________ Peter M. Swaziland, M.D.     PMJ/MEDQ  D:  04/19/2011  T:  04/20/2011  Job:  [id number]  Electronically Signed by PETER Swaziland M.D. on 04/21/2011 02:28:07 PM

## 2011-04-21 NOTE — Discharge Summary (Addendum)
[name], [name]                ACCOUNT NO.:  [id number]  MEDICAL RECORD NO.:  [id number]  LOCATION:  2507                         FACILITY:  [facility]  PHYSICIAN:  Peter M. Swaziland, M.D.  DATE OF BIRTH:  [birth year]  DATE OF ADMISSION:  04/18/2011 DATE OF DISCHARGE:  04/20/2011                              DISCHARGE SUMMARY   DISCHARGE DIAGNOSES: 1. Chest pain, noncardiac. 2. a.  Negative cardiac enzymes x3. 3. b.  Cardiac catheterization on April 19, 2011 showing minimal     nonobstructive coronary artery disease with stents in the ramus     intermedius branch still widely patent. 4. Known coronary artery disease. 5. a.  Catheterization this admission as above. 6. b.  Status post coronary artery bypass graft x2 in [place]. 7. c.  Status post percutaneous transluminal coronary angioplasty and     drug-eluting stent placement to the native ramus intermedius in     September 2012. 8. d.  Remote history of stenting to the saphenous vein graft to the     obtuse marginal in 2009. 9. e.  Status post drug-eluting stent placement to the saphenous vein     graft to the ramus in March 2012. 10.f.  Normal left ventricular function by catheterization on April 19, 2011. 11.Hyperlipidemia. 12.Mild anemia, felt dilutional with hemoglobin 11.6 at discharge. 13.Hypothyroidism. 14.Tobacco abuse. 15.History of urinary tract infections in the past. 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 17.Hypotension, Toprol held secondary to this.  HOSPITAL COURSE:  [name] is a 55 year old lady with a history of CAD status post CABG with recent drug-eluting stent placement to the ramus intermedius in September 2012.  She is maintained on Effient.  She felt well in September, but presented to the hospital with 2-3 weeks of intermittent chest pain, not particularly exertional, sometimes relieved with nitroglycerin, but the episode bringing her to the hospital was actually made worse with  nitroglycerin.  She often feels tired after these episodes.  She was also concerned about possibly having mini strokes.  She has occasional headaches for hours at a time leaving her feeling tired, and some feeling of discoordination recently.  She had not sustained any syncope, falls, or palpitations.  Cardiac enzymes were negative x2 upon arrival to the ER.  EKG was without acute changes. Neurologic examination was unremarkable.  However, her presentation was similar to the presentation she came in with in September 2012 and also felt to require cardiac catheterization to clarify her coronary anatomy. It found minimal nonobstructive CAD with previous stent that was still widely patent.  Her chest pain was felt noncardiac.  She felt better throughout her hospitalization without any further chest pain.  Dr. Swaziland recommended possible GI evaluation if her chest pain occurs. Attempts were made to obtain an MRI of the brain.  Because of her extensive discoordination, the patient was unable to complete this because she was claustrophobic, increased anxiety.  She will be recommended to follow up with her PCP at discharge.  Again, no focal neurologic deficits were found.  She did have some hypotension requiring cessation of her Toprol this admission with discharge blood pressure of 107/54.  Dr. Swaziland has seen  and examined her today and feels she is stable for discharge.  DISCHARGE LABORATORY DATA:  WBC 4.9, hemoglobin 11.6, hematocrit 34.1, platelet count 150.  Sodium 141, potassium 3.7, chloride 107, CO2 of 26, glucose 120, BUN 10, creatinine 0.75.  Cardiac enzymes negative x3.  STUDIES: 1. Cardiac catheterization on April 19, 2011, see full report for     details. 2. Chest x-ray on April 18, 2011 showed no acute cardiopulmonary     disease.  DISCHARGE MEDICATIONS: 1. Protonix 40 mg daily. 2. Aspirin 81 mg daily. 3. Ambien 10 mg at bedtime p.r.n. for sleep. 4. Crestor 20 mg at  bedtime. 5. Effexor 75 mg at bedtime. 6. Nitroglycerin sublingual 0.4 mg every 5 minutes as needed up to 3     doses for chest pain. 7. Plavix 75 mg daily. 8. Promethazine 25 mg 2 tablets by mouth as needed every 6 hours for     nausea. 9. Synthroid 25 mcg daily. 10.Xanax 1 mg q.i.d. p.r.n. anxiety. Prilosec was substituted for Protonix this admission given concomitant Plavix.  Toprol was discontinued because of hypotension.  Her Benadryl and Tylenol PM were held off on given her sense of unsteadiness because of their anticholinergic effects.  DISPOSITION:  [name] will be discharged in stable condition to home. She is not to lift anything over 5 pounds for 1 week, drive for 2 days, or participate in sexual activity for 1 week.  She is not to soak in a tub for 1 week, but may shower.  She should follow a low-sodium, heart- healthy diet.  She is to call or return for pain, swelling, bleeding, or pus at her cath site.  She is also to see her primary care doctor to evaluate her sense of unsteadiness.  She will follow up with Dr. Swaziland on May 23, 2011 at 8:30 a.m. as previously scheduled.  DURATION OF DISCHARGE ENCOUNTER:  Greater than 30 minutes including physician and PA time.     Ronie Spies, P.A.C.   ______________________________ Peter M. Swaziland, M.D.    DD/MEDQ  D:  04/20/2011  T:  04/20/2011  Job:  [id number]  cc:   Peter M. Swaziland, M.D.  Electronically Signed by PETER Swaziland M.D. on 04/21/2011 02:28:13 PM Electronically Signed by Ronie Spies  on 04/24/2011 02:09:38 PM MedRecNo: [id number] [facility], Account: [id number], DocSeq: [id number] Just prior to discharge, the patient had some recurrent chest pain again felt atypical that was completely relieved by a GI cocktail. She then complained of back pain and told the nurse she did not think Tylenol would help, so Tramadol was given to the  patient with some relief. She also requested a refill of her Phenergan at discharge. The  nurse stated that she should follow up with her primary doctor for this medicine, but [name] said that Dr. Swaziland has filled it for her in the past. This is not  typically our policy since we do not manage these types of medicines chronically, so as previously instructed by the nurse, I instructed [name] to contact her PCP for refills.  Electronically Signed by Ronie Spies  on 04/24/2011 02:09:22 PM

## 2011-04-24 NOTE — H&P (Addendum)
Christina, Flynn                ACCOUNT NO.:  [id number]  MEDICAL RECORD NO.:  [id number]  LOCATION:  2608                         FACILITY:  [facility]  PHYSICIAN:  Bevelyn Buckles. Bensimhon, MDDATE OF BIRTH:  04/28/1956  DATE OF ADMISSION:  04/18/2011 DATE OF DISCHARGE:                             HISTORY & PHYSICAL   PRIMARY CARDIOLOGIST:  Peter M. Swaziland, MD  PRIMARY CARE DOCTOR:  Jamey Reas , MD  CHIEF COMPLAINT:  Chest pain.  HISTORY OF PRESENT ILLNESS:  [name] is a 55 year old female with a history of CAD status post CABG.  History is as noted below who had recent drug-eluting stent placement to the ramus intermedius in September 2012 and is maintained on Effient.  She also has history of dyslipidemia and hypertension.  She felt well in September, however, upon for the last 2-3 weeks has had intermittent chest pain, but worse today.  It comes on and random, lasts approximately 15 minutes, it is usually relieved by the time she has taken 3 nitroglycerin and possibly Xanax at times.  It is not related to exertion.  Occasionally, when she has it, she feels exhausted for the rest of the day.  Today, she has had chest pain.  She took nitroglycerin approximately 10 minutes later, it felt like it was going to ease off, however, came back.  She had to take a second nitroglycerin, but this made it worse.  A third tablet gave no relief.  This pain is more cruciating than before, and is associated with a jaw locking sensations, some lightheadedness.  It started at 3 p.m. today.  At 5:30 p.m. in the ER morphine helped, but at 6 p.m. she had recurrent symptoms, which eased up with Dilaudid.  She now feels tired without chest pain.  Troponins negative x2 and EKG shows no acute changes and no change from prior.  Although some of her pain is atypical, this is a similar presentation bringing her to the hospital in September.  She is also concerned about possibly having mini  strokes. She has occasional headaches for hours at a time that leave her feeling tired, and also states she has had some feeling of discoordination recently.  She has not sustained any syncope, falls, or palpitations.  PAST MEDICAL HISTORY: 1. CAD.     a.     Status post CABG x2 in [place].     b.     Recent PTCA and drug-eluting stent placement to the native      ramus intermedius in September 2012.     c.     Remote history of stenting to the SVG to the OM in 2009.     d.     Drug-eluting stent placement to the SVG to the ramus in      March 2012.     e.     EF 60% by cath in March 2012. 2. Dyslipidemia. 3. Hypertension. 4. Hypothyroidism. 5. Tobacco abuse. 6. Urinary tract infection. 7. Mastocytic enterocolitis.  MEDICATIONS: 1. Prilosec 20 mg daily. 2. Effexor 75 mg at bedtime. 3. Sublingual nitro. 4. Aspirin 81 mg 1 tablet in the morning and 3 tablets in  the evening. 5. Crestor 20 mg at bedtime. 6. Plavix 75 mg daily. 7. Promethazine 25-50 mg q.6 h. p.r.n. nausea. 8. Synthroid 25 mcg daily. 9. Toprol-XL 50 mg at bedtime. 10.Xanax 1 mg q.i.d. 11.Tylenol PM p.r.n. 12.Benadryl p.r.n.  ALLERGIES:  No known drug allergies.  SOCIAL HISTORY:  [name] lives in [place] alone.  She cares for her granddaughter.  She has in the past smoked for 23 years and has had some cutting down with electronic cigarettes, but when she ran out, she bought a pack of cigarettes.  She denies any alcohol or drug use.  FAMILY HISTORY:  Positive for hypertension in her brother and coronary artery disease in her sister.  REVIEW OF SYSTEMS:  She does note more shortness of breath with exertion in the past few weeks.  No vomiting.  She has had some abdominal cramping today, lasting briefly only seconds, no bright red blood per rectum, melena, or hematemesis.  She has had some nausea, but no vomiting.  She generally feels tired.  All other systems reviewed and otherwise  negative.  LABORATORY DATA:  WBC 8.7, hemoglobin 13.6, hematocrit 38.7, platelet count 224.  Sodium 137, potassium 4, chloride 103, CO2 of 25, glucose 119, BUN 11, creatinine 0.69, troponin negative.  EKG:  See HPI.  RADIOLOGIC STUDIES:  Chest x-ray showed no active disease.  PHYSICAL EXAMINATION:  VITAL SIGNS:  Temperature 97.8, pulse 76, respirations 16, blood pressure 106/54, pulse ox 99% on room air. GENERAL:  This is a pleasant white female in no acute distress. HEENT:  Normocephalic, atraumatic.  Extraocular movements intact.  Clear sclerae.  Nares are without discharge. NECK:  Supple without carotid bruits. HEART:  Auscultation of the heart reveals regular rate and rhythm with S1 and S2 without murmurs, rubs, or gallops. LUNGS:  Clear to auscultation bilaterally without wheezes, rales, or rhonchi. ABDOMEN:  Soft, nontender, nondistended with positive bowel sounds. EXTREMITIES:  Warm and dry without edema.  She has 1+ pedal pulses bilaterally. NEUROLOGIC:  She is alert and oriented x3.  Responds to questions appropriately with a normal affect.  Neurologic exam is nonfocal.  She has normal cerebellar function.  Strength is 5/5 in all extremities. Point-to-point is intact.  ASSESSMENT/PLAN:  The patient was seen and examined by Dr. Gala Romney and myself.  This is a 55 year old female with a history of coronary artery disease as noted above, hypertension, and tobacco abuse who presents with symptoms of chest pain with both typical and atypical features, however, consistent with prior presentations of her acute coronary syndrome.  EKG and cardiac enzymes are negative thus far.  Given her recent PCI and stent, we will proceed with catheterization in the morning.  She has already been placed on IV heparin per pharmacy by the ER and we will continue aspirin, beta-blocker, and statin; however, we will change her home dose of aspirin to 81 mg daily.  Her Effient will be continued.   She will also need cardiac rehab post discharge.  Given her history of discoordination and recent catheterizations we will also check an MRI of the brain with and without contrast.  Anxiety is also likely a factor and we will continue to follow.     Dayna Dunn, P.A.C.   ______________________________ Bevelyn Buckles. Bensimhon, MD    DD/MEDQ  D:  04/18/2011  T:  04/19/2011  Job:  [id number]  cc:   Peter M. Swaziland, M.D. Jamey Reas, MD  Electronically Signed by Ronie Spies  on 04/24/2011 02:06:25 PM Electronically  Signed by Arvilla Meres MD on 04/27/2011 02:09:19 PM

## 2011-05-10 ENCOUNTER — Other Ambulatory Visit: Payer: Self-pay | Admitting: Cardiovascular Disease

## 2011-05-23 ENCOUNTER — Ambulatory Visit: Payer: Medicare Other | Admitting: Cardiology

## 2011-05-28 ENCOUNTER — Telehealth: Payer: Self-pay | Admitting: Cardiology

## 2011-05-28 NOTE — Telephone Encounter (Signed)
New Msg: Pt calling that that weight is fluctuating from an increase of 20 pounds and then a loss of 10 pounds within a couple of days.  Pt wants to discuss this with MD. Please return pt call to discuss further.

## 2011-05-28 NOTE — Telephone Encounter (Signed)
Called stating she has had 20 lb wt gain in 10 days; then lost 10 lbs in a few days. Is SOB, and having some CP. She missed her app 11/28. States she didn't have transportation. Was d/c from hospital 10/28 and had cath at that time. Will schedule her an app to see Lawson Fiscal in AM. She does have transportationl

## 2011-05-29 ENCOUNTER — Ambulatory Visit (INDEPENDENT_AMBULATORY_CARE_PROVIDER_SITE_OTHER): Payer: Medicare Other | Admitting: Nurse Practitioner

## 2011-05-29 ENCOUNTER — Encounter: Payer: Self-pay | Admitting: Nurse Practitioner

## 2011-05-29 VITALS — BP 90/60 | HR 109 | Ht 60.0 in | Wt 156.4 lb

## 2011-05-29 DIAGNOSIS — R0600 Dyspnea, unspecified: Secondary | ICD-10-CM

## 2011-05-29 DIAGNOSIS — Z720 Tobacco use: Secondary | ICD-10-CM

## 2011-05-29 DIAGNOSIS — I10 Essential (primary) hypertension: Secondary | ICD-10-CM

## 2011-05-29 DIAGNOSIS — R0609 Other forms of dyspnea: Secondary | ICD-10-CM

## 2011-05-29 DIAGNOSIS — I2510 Atherosclerotic heart disease of native coronary artery without angina pectoris: Secondary | ICD-10-CM

## 2011-05-29 DIAGNOSIS — Z8679 Personal history of other diseases of the circulatory system: Secondary | ICD-10-CM

## 2011-05-29 DIAGNOSIS — R079 Chest pain, unspecified: Secondary | ICD-10-CM

## 2011-05-29 DIAGNOSIS — F17200 Nicotine dependence, unspecified, uncomplicated: Secondary | ICD-10-CM

## 2011-05-29 LAB — CBC WITH DIFFERENTIAL/PLATELET
Basophils Absolute: 0 10*3/uL (ref 0.0–0.1)
Basophils Relative: 0.3 % (ref 0.0–3.0)
Eosinophils Absolute: 0.3 10*3/uL (ref 0.0–0.7)
Eosinophils Relative: 2.9 % (ref 0.0–5.0)
HCT: 41.3 % (ref 36.0–46.0)
Hemoglobin: 13.8 g/dL (ref 12.0–15.0)
Lymphocytes Relative: 27.7 % (ref 12.0–46.0)
Lymphs Abs: 2.5 10*3/uL (ref 0.7–4.0)
MCHC: 33.5 g/dL (ref 30.0–36.0)
MCV: 95 fl (ref 78.0–100.0)
Monocytes Absolute: 0.5 10*3/uL (ref 0.1–1.0)
Monocytes Relative: 5.1 % (ref 3.0–12.0)
Neutro Abs: 5.8 10*3/uL (ref 1.4–7.7)
Neutrophils Relative %: 64 % (ref 43.0–77.0)
Platelets: 266 10*3/uL (ref 150.0–400.0)
RBC: 4.34 Mil/uL (ref 3.87–5.11)
RDW: 13.1 % (ref 11.5–14.6)
WBC: 9.1 10*3/uL (ref 4.5–10.5)

## 2011-05-29 LAB — BASIC METABOLIC PANEL
BUN: 17 mg/dL (ref 6–23)
CO2: 26 mEq/L (ref 19–32)
Calcium: 9.4 mg/dL (ref 8.4–10.5)
Chloride: 107 mEq/L (ref 96–112)
Creatinine, Ser: 0.7 mg/dL (ref 0.4–1.2)
GFR: 91.36 mL/min (ref >60.00)
Glucose, Bld: 78 mg/dL (ref 70–99)
Potassium: 5.2 mEq/L — ABNORMAL HIGH (ref 3.5–5.1)
Sodium: 141 mEq/L (ref 135–145)

## 2011-05-29 LAB — BRAIN NATRIURETIC PEPTIDE: Pro B Natriuretic peptide (BNP): 4 pg/mL (ref 0.0–100.0)

## 2011-05-29 NOTE — Progress Notes (Signed)
[name] Date of Birth: 1955/06/26 Medical Record #[id number]  History of Present Illness: [name] is seen today for a work in visit. She is seen for Dr. Swaziland. She missed her appointment last week with Dr. Swaziland due to transportation issues. She has known CAD, ongoing tobacco abuse and HTN. She was recathed back at the end of October due to recurrent chest pain. Her stent was noted to be patent in the ramus intermediate branch. She had minimial nonobstructive disease with normal LV function. Her graft to the intermediate is known to be occluded. Her pain was not felt to be cardiac in origin. Her Toprol was stopped during that admission due to hypotension. She has had long standing issues with chest pain.   She comes in today. She has multiple complaints. She still has some occasional chest pain. She continues to smoke. She is short of breath. She is tired. Her feet are cold. She is not lightheaded or dizzy. She has had abrupt weight gain but no real swelling. She is currently undergoing a GI evaluation with Dr. Evette Cristal. Salt use is questionable.   Current Outpatient Prescriptions on File Prior to Visit  Medication Sig Dispense Refill  . acetaminophen (TYLENOL) 500 MG tablet Take 1 tablet (500 mg total) by mouth 4 (four) times daily.  100 tablet    . ALPRAZolam (XANAX) 0.25 MG tablet Take 1 tablet (0.25 mg total) by mouth at bedtime as needed for sleep.  30 tablet  0  . ALPRAZolam (XANAX) 0.5 MG tablet Take 0.5 mg by mouth 6 (six) times daily.       Marland Kitchen aspirin 81 MG tablet Take 81 mg by mouth daily.        Marland Kitchen levothyroxine (SYNTHROID, LEVOTHROID) 25 MCG tablet Take 1 tablet (25 mcg total) by mouth daily.  30 tablet  5  . metoprolol (TOPROL-XL) 50 MG 24 hr tablet Take 50 mg by mouth at bedtime.       . nitroGLYCERIN (NITROSTAT) 0.4 MG SL tablet Place 0.4 mg under the tongue every 5 (five) minutes as needed.        Marland Kitchen PLAVIX 75 MG tablet TAKE ONE TABLET BY MOUTH EVERY DAY  30 each  6  . pravastatin  (PRAVACHOL) 40 MG tablet Take 1 tablet (40 mg total) by mouth daily.  5 tablet  3  . sertraline (ZOLOFT) 50 MG tablet Take 50 mg by mouth daily.        Marland Kitchen zolpidem (AMBIEN) 10 MG tablet TAKE ONE TABLET BY MOUTH EVERY DAY AT BEDTIME AS NEEDED  30 tablet  0    Allergies  Allergen Reactions  . Ranitidine     Past Medical History  Diagnosis Date  . Chest pain, cardiac   . Coronary artery disease   . Hypertension   . Hyperlipidemia   . Tobacco abuse   . History of sinus tachycardia   . Hypothyroidism   . Hx: UTI (urinary tract infection)     multiple  . Enterocolitis   . Hx of CABG 2002  . Presence of coronary artery bypass graft stent 2009    DES in the SVG to the intermediate  . S/P cardiac cath 2011    no intervention  . S/P cardiac cath March 2012    repeat stenting of the SVG to the intermediate    Past Surgical History  Procedure Date  . Coronary artery bypass graft 2002    History  Smoking status  . Current Some Day Smoker --  0.3 packs/day for 30 years  . Types: Cigarettes  Smokeless tobacco  . Never Used    History  Alcohol Use No    Family History  Problem Relation Age of Onset  . Heart disease Father     Review of Systems: The review of systems is per the HPI.  All other systems were reviewed and are negative.  Physical Exam: Ht 5' (1.524 m)  Wt 156 lb 6.4 oz (70.943 kg)  BMI 30.54 kg/m2 Patient is alert and in no acute distress. Skin is warm and dry. Color is normal.  HEENT is unremarkable. Normocephalic/atraumatic. PERRL. Sclera are nonicteric. Neck is supple. No masses. No JVD. Lungs are coarse. Cardiac exam shows a regular rate and rhythm. Abdomen is soft. Extremities are without edema. Gait and ROM are intact. No gross neurologic deficits noted.  LABORATORY DATA: EKG shows sinus tachycardia.    Assessment / Plan:

## 2011-05-29 NOTE — Assessment & Plan Note (Signed)
She remains tachycardic. Blood pressure does not support restarting the beta blocker.

## 2011-05-29 NOTE — Assessment & Plan Note (Signed)
This is a chronic issue. Last cath showed stable findings in October. I have left her on her current medicines. She has a GI evaluation in progress.

## 2011-05-29 NOTE — Assessment & Plan Note (Signed)
Smoking cessation is imperative. I do not think she has the motivation to stop.

## 2011-05-29 NOTE — Assessment & Plan Note (Signed)
This is felt to be related to her smoking. We will refer to pulmonary. Labs are also checked today to include BMET, BNP and CBC. I will have her see Dr. Swaziland in 2 months. Patient is agreeable to this plan and will call if any problems develop in the interim.

## 2011-05-29 NOTE — Assessment & Plan Note (Signed)
Blood pressure remains low. I have left her off of her beta blocker.

## 2011-05-29 NOTE — Patient Instructions (Addendum)
Smoking cessation is strongly encouraged.   We will get you to see one of the lung doctors.  We are checking some labs today.  Continue with your GI evaluation  See Dr. Swaziland in 3 months.

## 2011-05-30 ENCOUNTER — Telehealth: Payer: Self-pay | Admitting: *Deleted

## 2011-05-30 NOTE — Telephone Encounter (Signed)
Message copied by Lorayne Bender on Wed May 30, 2011  3:40 PM ------      Message from: Rosalio Macadamia      Created: Wed May 30, 2011  8:40 AM       Ok to report. BNP is very low. Suggests her dyspnea is more pulmonary related. Send copy to Dr. Sherene Sires. Smoking cessation encouraged.

## 2011-05-30 NOTE — Telephone Encounter (Signed)
Notified of lab results. Will send copy to Dr. Sherene Sires and Dr. Cyndia Bent.

## 2011-06-15 ENCOUNTER — Ambulatory Visit (INDEPENDENT_AMBULATORY_CARE_PROVIDER_SITE_OTHER)
Admission: RE | Admit: 2011-06-15 | Discharge: 2011-06-15 | Disposition: A | Discharge status: Home / Self Care | Payer: Medicare Other | Source: Ambulatory Visit | Attending: Internal Medicine | Admitting: Internal Medicine

## 2011-06-15 ENCOUNTER — Encounter: Payer: Self-pay | Admitting: Internal Medicine

## 2011-06-15 ENCOUNTER — Ambulatory Visit (INDEPENDENT_AMBULATORY_CARE_PROVIDER_SITE_OTHER): Payer: Medicare Other | Admitting: Internal Medicine

## 2011-06-15 VITALS — BP 138/90 | HR 93 | Temp 98.3°F | Ht 60.0 in | Wt 158.0 lb

## 2011-06-15 DIAGNOSIS — R0600 Dyspnea, unspecified: Secondary | ICD-10-CM

## 2011-06-15 DIAGNOSIS — R0609 Other forms of dyspnea: Secondary | ICD-10-CM

## 2011-06-15 NOTE — Patient Instructions (Signed)
Please remember to go to the x-ray department downstairs for your tests - we will call you with the results when they are available.   Protonix 40mg  Take 30- 60 min before your first and last meals of the day   GERD (REFLUX)  is an extremely common cause of respiratory symptoms, many times with no significant heartburn at all.    It can be treated with medication, but also with lifestyle changes including avoidance of late meals, excessive alcohol, smoking cessation, and avoid fatty foods, chocolate, peppermint, colas, red wine, and acidic juices such as orange juice.  NO MINT OR MENTHOL PRODUCTS SO NO COUGH DROPS  USE SUGARLESS CANDY INSTEAD (jolley ranchers or Stover's)  NO OIL BASED VITAMINS - use powdered substitutes.      Please schedule a follow up office visit in 6 weeks, call sooner if needed with PFT's.

## 2011-06-15 NOTE — Progress Notes (Signed)
  Subjective:    Patient ID: [name], female    DOB: [birth year], 55 y.o.   MRN: [id number]  HPI  75 yowf quit smoking Nov 2012 but difficulty coming off the vent in 2002 in [place] and ? recurrent angina referred to pulmonary clinic 06/15/2011 by Dr Swaziland who does not feel her cp's are IHD  October 2012 new lower mid line ant  cp assoc with doe eg can't chase grand-daughter around a one floor house or out in the yard,  ? If worse since quit smoking, took combivent seemed to help in past.  Recently stopped metaprolol. Takes protonix for nausea x 6 month typically at bedtime with GI referral and plan endoscopy next.  No cough or purulent sputum.    CP pressure but not like her AP previously, no rad to back, no assoc diaph, nausea, presyncope or change with meals/ flatulence or erucatiion.     Review of Systems  Constitutional: Negative for fever, chills and unexpected weight change.  HENT: Negative for ear pain, nosebleeds, congestion, sore throat, rhinorrhea, sneezing, trouble swallowing, dental problem, voice change, postnasal drip and sinus pressure.   Eyes: Negative for visual disturbance.  Respiratory: Positive for shortness of breath. Negative for cough and choking.   Cardiovascular: Positive for chest pain. Negative for leg swelling.  Gastrointestinal: Positive for abdominal pain. Negative for vomiting and diarrhea.  Genitourinary: Negative for difficulty urinating.  Musculoskeletal: Positive for arthralgias.  Skin: Negative for rash.  Neurological: Positive for headaches. Negative for tremors and syncope.  Hematological: Does not bruise/bleed easily.       Objective:   Physical Exam  amb wf nad Wt 158 06/15/11 HEENT mild turbinate edema.  Oropharynx no thrush or excess pnd or cobblestoning.  No JVD or cervical adenopathy. Mild accessory muscle hypertrophy. Trachea midline, nl thryroid. Chest was hyperinflated by percussion with diminished breath sounds and moderate  increased exp time without wheeze. Hoover sign positive at mid inspiration. Regular rate and rhythm without murmur gallop or rub or increase P2 or edema.  Abd: no hsm, nl excursion. Ext warm without cyanosis or clubbing.   CXR  06/15/2011 :  Comparison: 04/18/2011; 03/08/2011; 09/06/2010 chest CT - 11/01/2010  Findings: Grossly unchanged cardiac silhouette and mediastinal contours post median sternotomy. Grossly unchanged minimal bibasilar heterogeneous opacities. No new focal airspace opacities. No pleural effusion or pneumothorax. Unchanged bones. Post cholecystectomy.  IMPRESSION: Grossly unchanged bibasilar atelectasis/scarring without definite superimposed acute cardiopulmonary disease.         Assessment & Plan:

## 2011-06-16 NOTE — Assessment & Plan Note (Addendum)
When respiratory symptoms worsen after a patient reports complete smoking cessation,  it is very hard to "blame" COPD specifically or airways disorders in general  ie it doesn't make any more sense than hearing a  [employer] driver wrecked his car while driving his kids to school or a surgeon sliced his hand off carving roast beef (it must be rare indeed!)     That is to say, once the high risk activity stops,  the symptoms should not suddenly erupt or markedly worsen.  If so, the differential diagnosis should include  obesity/deconditioning,  LPR/Reflux/Aspiration syndromes, anxiety,   occult CHF(excluded by bnp =4), or  especially side effect of medications commonly used in this population.    Since not on acei or amiodarone, GERD would be the most likely problem esp assoc with midline CP, so try intense ppi rx and diet and return for pft's next  See instructions for specific recommendations which were reviewed directly with the patient who was given a copy with highlighter outlining the key components.

## 2011-06-18 NOTE — Progress Notes (Signed)
Quick Note:  Spoke with pt and notified of results per Dr. Wert. Pt verbalized understanding and denied any questions.  ______ 

## 2011-06-22 ENCOUNTER — Encounter: Payer: Self-pay | Admitting: Cardiology

## 2011-06-25 ENCOUNTER — Ambulatory Visit: Payer: Medicare Other | Admitting: Cardiology

## 2011-06-27 ENCOUNTER — Telehealth: Payer: Self-pay | Admitting: Internal Medicine

## 2011-06-27 ENCOUNTER — Other Ambulatory Visit (HOSPITAL_COMMUNITY): Payer: Self-pay | Admitting: Gastroenterology

## 2011-06-27 DIAGNOSIS — R112 Nausea with vomiting, unspecified: Secondary | ICD-10-CM

## 2011-06-27 NOTE — Telephone Encounter (Signed)
Lm for pt to call back. Looks like pt was given results on 06/18/11

## 2011-06-28 DIAGNOSIS — N390 Urinary tract infection, site not specified: Secondary | ICD-10-CM | POA: Diagnosis not present

## 2011-06-28 DIAGNOSIS — Z87440 Personal history of urinary (tract) infections: Secondary | ICD-10-CM | POA: Diagnosis not present

## 2011-06-28 DIAGNOSIS — M549 Dorsalgia, unspecified: Secondary | ICD-10-CM | POA: Diagnosis not present

## 2011-06-28 NOTE — Telephone Encounter (Signed)
I spoke with patient about results and she verbalized understanding and had no questions 

## 2011-07-02 ENCOUNTER — Encounter (HOSPITAL_COMMUNITY)
Admission: RE | Admit: 2011-07-02 | Discharge: 2011-07-02 | Disposition: A | Discharge status: Home / Self Care | Payer: Medicare Other | Source: Ambulatory Visit | Attending: Gastroenterology | Admitting: Gastroenterology

## 2011-07-02 DIAGNOSIS — R112 Nausea with vomiting, unspecified: Secondary | ICD-10-CM | POA: Insufficient documentation

## 2011-07-02 DIAGNOSIS — R6881 Early satiety: Secondary | ICD-10-CM | POA: Diagnosis not present

## 2011-07-02 DIAGNOSIS — R109 Unspecified abdominal pain: Secondary | ICD-10-CM | POA: Diagnosis not present

## 2011-07-02 MED ORDER — TECHNETIUM TC 99M SULFUR COLLOID
2.0000 | Freq: Once | INTRAVENOUS | Status: AC | PRN
  Administered 2011-07-02: 2 via INTRAVENOUS

## 2011-07-04 DIAGNOSIS — R110 Nausea: Secondary | ICD-10-CM | POA: Diagnosis not present

## 2011-07-04 DIAGNOSIS — K3184 Gastroparesis: Secondary | ICD-10-CM | POA: Diagnosis not present

## 2011-07-10 ENCOUNTER — Telehealth: Payer: Self-pay | Admitting: Nurse Practitioner

## 2011-07-10 ENCOUNTER — Telehealth: Payer: Self-pay | Admitting: Cardiology

## 2011-07-10 NOTE — Telephone Encounter (Signed)
Pt requesting refill of nitro, wants 4 bottles to put in different locations, uses [employer]

## 2011-07-10 NOTE — Telephone Encounter (Signed)
The patient called requesting copies of her records to take with her to another physician. She was advised of the need to complete a release form. The patient has requested that the form be mailed to her home address. The call was dropped. I placed in the envelope with her release, a copy of the copy services fee schedule...djc

## 2011-07-11 MED ORDER — NITROGLYCERIN 0.4 MG SL SUBL: 0.4000 mg | SUBLINGUAL_TABLET | SUBLINGUAL | Status: DC | PRN

## 2011-08-01 ENCOUNTER — Encounter: Payer: Medicare Other | Admitting: Internal Medicine

## 2011-08-01 NOTE — Progress Notes (Signed)
 This encounter was created in error - please disregard.

## 2011-08-15 DIAGNOSIS — N329 Bladder disorder, unspecified: Secondary | ICD-10-CM | POA: Diagnosis not present

## 2011-08-15 DIAGNOSIS — I10 Essential (primary) hypertension: Secondary | ICD-10-CM | POA: Diagnosis not present

## 2011-08-15 DIAGNOSIS — M549 Dorsalgia, unspecified: Secondary | ICD-10-CM | POA: Diagnosis not present

## 2011-08-27 ENCOUNTER — Encounter: Payer: Self-pay | Admitting: Internal Medicine

## 2011-08-27 ENCOUNTER — Ambulatory Visit (INDEPENDENT_AMBULATORY_CARE_PROVIDER_SITE_OTHER): Payer: Medicare Other | Admitting: Internal Medicine

## 2011-08-27 DIAGNOSIS — R0600 Dyspnea, unspecified: Secondary | ICD-10-CM

## 2011-08-27 DIAGNOSIS — R0609 Other forms of dyspnea: Secondary | ICD-10-CM

## 2011-08-27 DIAGNOSIS — F17200 Nicotine dependence, unspecified, uncomplicated: Secondary | ICD-10-CM

## 2011-08-27 DIAGNOSIS — R0989 Other specified symptoms and signs involving the circulatory and respiratory systems: Secondary | ICD-10-CM

## 2011-08-27 LAB — PULMONARY FUNCTION TEST

## 2011-08-27 NOTE — Progress Notes (Signed)
  Subjective:    Patient ID: [name], female    DOB: 04/15/1956.   MRN: [id number]     Brief patient profile:  76 yowf smoker with difficulty coming off the vent in 2002 in [place] and ? recurrent angina referred to pulmonary clinic 06/15/2011 by Dr Swaziland who does not feel her cp's are IHD  HPI October 2012 new lower mid line ant  cp assoc with doe eg can't chase grand-daughter around a one floor house or out in the yard,  ? If worse since quit smoking, took combivent seemed to help in past.  Recently stopped metaprolol. Takes protonix for nausea x 6 month typically at bedtime with GI referral and plan endoscopy next.  No cough or purulent sputum.    CP pressure but not like her AP previously, no rad to back, no assoc diaph, nausea, presyncope or change with meals/ flatulence or erucatiion.   rec Please remember to go to the x-ray department downstairs for your tests - we will call you with the results when they are available. Protonix 40mg  Take 30- 60 min before your first and last meals of the day  GERD diet Please schedule a follow up office visit in 6 weeks, call sooner if needed with PFT's.   08/27/2011 f/u ov/Erminio Nygard quitting smoing today and overall much improved breathing, no cough on no pulmonary meds at all.   Sleeping ok without nocturnal  or early am exacerbation  of respiratory  c/o's or need for noct saba. Also denies any obvious fluctuation of symptoms with weather or environmental changes or other aggravating or alleviating factors except as outlined above   ROS  At present neg for  any significant sore throat, dysphagia, itching, sneezing,  nasal congestion or excess/ purulent secretions,  fever, chills, sweats, unintended wt loss, pleuritic or exertional cp, hempoptysis, orthopnea pnd or leg swelling.  Also denies presyncope, palpitations, heartburn, abdominal pain, nausea, vomiting, diarrhea  or change in bowel or urinary habits, dysuria,hematuria,  rash, arthralgias,  visual complaints, headache, numbness weakness or ataxia.           Objective:   Physical Exam  amb wf nad Wt 158 06/15/11 > 08/27/2011   150 HEENT mild turbinate edema.  Oropharynx no thrush or excess pnd or cobblestoning.  No JVD or cervical adenopathy. Mild accessory muscle hypertrophy. Trachea midline, nl thryroid. Chest was hyperinflated by percussion with diminished breath sounds and moderate increased exp time without wheeze. Hoover sign positive at mid inspiration. Regular rate and rhythm without murmur gallop or rub or increase P2 or edema.  Abd: no hsm, nl excursion. Ext warm without cyanosis or clubbing.   CXR  06/15/2011 :  Comparison: 04/18/2011; 03/08/2011; 09/06/2010 chest CT - 11/01/2010  Findings: Grossly unchanged cardiac silhouette and mediastinal contours post median sternotomy. Grossly unchanged minimal bibasilar heterogeneous opacities. No new focal airspace opacities. No pleural effusion or pneumothorax. Unchanged bones. Post cholecystectomy.  IMPRESSION: Grossly unchanged bibasilar atelectasis/scarring without definite superimposed acute cardiopulmonary disease.         Assessment & Plan:

## 2011-08-27 NOTE — Patient Instructions (Signed)
Stop smoking before it stops it's the most important aspect of your care and it's definitely not too late.

## 2011-08-27 NOTE — Progress Notes (Signed)
PFT done today. 

## 2011-08-28 ENCOUNTER — Encounter: Payer: Self-pay | Admitting: Internal Medicine

## 2011-08-28 NOTE — Assessment & Plan Note (Addendum)
-   BNP 05/29/11 = 4   - PFT's 08/27/11 FEV1  2.20 (106%) ratio 75 with mini inflection and dlco 57 corrects to 88%  No evidence of significant copd here. Worry about the use of macrodantin given cxr is not nl and would avoid it in the future if possible.

## 2011-08-30 ENCOUNTER — Encounter: Payer: Self-pay | Admitting: Internal Medicine

## 2011-08-30 NOTE — Assessment & Plan Note (Addendum)
>   3 min discussion  I reviewed the Flethcher curve with patient that basically indicates  if you quit smoking when your best day FEV1 is still well preserved it is highly unlikely you will progress to severe disease and informed the patient there was no medication on the market that has proven to change the curve or the likelihood of progression.  Therefore stopping smoking and maintaining abstinence is the most important aspect of care, not choice of inhalers or for that matter, doctors.   

## 2011-08-31 ENCOUNTER — Telehealth: Payer: Self-pay | Admitting: Cardiology

## 2011-08-31 NOTE — Telephone Encounter (Signed)
Pt had bad angina on 3/6 and she took 5 nitro and she is very weak and she is on and has an appt on 3/14

## 2011-08-31 NOTE — Telephone Encounter (Signed)
I spoke with the patient. She states that she had CP on 3/6 and took 5 NTG prior to the pain easing up. I explained to her that she should not take more than 3 NTG within a 15 minute period, then report to the ER as she may also run in to problems with severe hypotension. The patient states her discomfort felt similar to what she was having prior to stent placements in the past. She has not required any more NTG since Wednesday, but feels weak today. She has a little dizziness/ligheadedness that she complains of. She has some nausea, but states she takes phenergan for this as it is not a new problem. She is supposed to be seeing a specialist in [facility] for her GI issues in the near future. I explained to her I will review the above with Norma Fredrickson, NP and call her back. She is agreeable.

## 2011-08-31 NOTE — Telephone Encounter (Signed)
Reviewed the above with Norma Fredrickson, NP. Per Lawson Fiscal, weakness may be due to PFT's. Have the patient rest today and if CP re-occurs, she should report to the ER. The patient is aware and voices understanding.

## 2011-09-03 ENCOUNTER — Encounter: Payer: Self-pay | Admitting: Internal Medicine

## 2011-09-06 ENCOUNTER — Encounter: Payer: Self-pay | Admitting: Cardiology

## 2011-09-06 ENCOUNTER — Ambulatory Visit (INDEPENDENT_AMBULATORY_CARE_PROVIDER_SITE_OTHER): Payer: Medicare Other | Admitting: Cardiology

## 2011-09-06 VITALS — BP 150/98 | HR 87 | Ht 60.0 in | Wt 157.0 lb

## 2011-09-06 DIAGNOSIS — E785 Hyperlipidemia, unspecified: Secondary | ICD-10-CM | POA: Diagnosis not present

## 2011-09-06 DIAGNOSIS — I10 Essential (primary) hypertension: Secondary | ICD-10-CM

## 2011-09-06 DIAGNOSIS — F17200 Nicotine dependence, unspecified, uncomplicated: Secondary | ICD-10-CM

## 2011-09-06 DIAGNOSIS — I2510 Atherosclerotic heart disease of native coronary artery without angina pectoris: Secondary | ICD-10-CM

## 2011-09-06 DIAGNOSIS — Z720 Tobacco use: Secondary | ICD-10-CM

## 2011-09-06 MED ORDER — METOPROLOL TARTRATE 25 MG PO TABS: 25.0000 mg | ORAL_TABLET | Freq: Two times a day (BID) | ORAL | Status: DC

## 2011-09-06 NOTE — Assessment & Plan Note (Signed)
She's had previous stenting of the vein graft to the intermediate branch which subsequently occluded. She had stenting of the native ramus branch in September. Repeat cardiac catheterization in October showed continued patency. She is really not having significant anginal symptoms at this time. She asked that if she needed bladder surgery which should be okay from a cardiac standpoint. I think she would be but would favor that she be maintained on antiplatelet therapy through surgery. She is now 6 months out from her previous stent.

## 2011-09-06 NOTE — Patient Instructions (Signed)
We will increase you metoprolol to 25 mg twice a day for BP.  Continue your other medication.  I will see you again in 4 months.

## 2011-09-06 NOTE — Assessment & Plan Note (Signed)
Blood pressure is elevated. I recommended increasing her metoprolol to 25 mg twice a day. We will watch her blood pressure closely.

## 2011-09-06 NOTE — Progress Notes (Signed)
[name] Date of Birth: Feb 16, 1956 Medical Record #[id number]  History of Present Illness: [name] is seen today for a followup visit. She is actually done fairly well from a cardiac standpoint. She had pulmonary function studies done which were normal. The day following her pulmonary function studies she did have some chest pain for which she took nitroglycerin but otherwise has been doing well. She has also undergone GI evaluation which demonstrates evidence of gastroparesis. She complains of ongoing bladder problems with recurrent bladder infections and severe dysuria and hematuria. She thinks she may have a fistula. She is being followed by urology. She reports multiple tack ups of her bladder in the past and a previously that was later moved. Her blood pressure has been higher recently and she was started back on a low-dose of metoprolol. On her previous hospitalization Toprol was discontinued because of low blood pressure.   Current Outpatient Prescriptions on File Prior to Visit  Medication Sig Dispense Refill  . ALPRAZolam (XANAX) 1 MG tablet Take 1 mg by mouth daily as needed.       Marland Kitchen aspirin 81 MG tablet Take 81 mg by mouth daily.        Marland Kitchen HYDROcodone-acetaminophen (NORCO) 5-325 MG per tablet 1 every 6 hours as needed       . levothyroxine (SYNTHROID, LEVOTHROID) 25 MCG tablet Take 1 tablet (25 mcg total) by mouth daily.  30 tablet  5  . nitroGLYCERIN (NITROSTAT) 0.4 MG SL tablet Place 1 tablet (0.4 mg total) under the tongue every 5 (five) minutes as needed.  100 tablet  PRN  . pantoprazole (PROTONIX) 40 MG tablet Take 40 mg by mouth 2 (two) times daily.        Marland Kitchen PLAVIX 75 MG tablet TAKE ONE TABLET BY MOUTH EVERY DAY  30 each  6  . promethazine (PHENERGAN) 25 MG tablet As needed      . rosuvastatin (CRESTOR) 20 MG tablet Take 20 mg by mouth daily.        Marland Kitchen zolpidem (AMBIEN) 10 MG tablet TAKE ONE TABLET BY MOUTH EVERY DAY AT BEDTIME AS NEEDED  30 tablet  0  . ALPRAZolam (XANAX) 0.25  MG tablet Take 1 tablet (0.25 mg total) by mouth at bedtime as needed for sleep.  30 tablet  0    Allergies  Allergen Reactions  . Morphine And Related   . Ranitidine     Past Medical History  Diagnosis Date  . Chest pain, cardiac     Has chronic chest pain.   . Coronary artery disease     s/p CABG x 2 in [place], s/p DES to SVG to OM in 2009; s/p DES to SVG to ramus intermediate in March 2012, s/p DES to native ramus intermediate in September 2012; last cath in Oct 2012 showing patent stent, minimal disease with normal EF.  Marland Kitchen Hypertension     metoprolol stopped due to hypotension  . Hyperlipidemia   . Tobacco abuse   . History of sinus tachycardia   . Hypothyroidism   . Hx: UTI (urinary tract infection)     multiple  . Enterocolitis   . Hx of CABG 2002  . Presence of coronary artery bypass graft stent 2009    DES in the SVG to the OM  . S/P cardiac cath 2011    no intervention  . S/P cardiac cath March 2012    repeat stenting of the SVG to the intermediate  . S/P cardiac cath Sept  2012    DES to native ramus intermediate  . S/P cardiac cath Oct 2012    Stents patent, nonobstructive CAD, normal EF    Past Surgical History  Procedure Date  . Coronary artery bypass graft 2002  . Cholecystectomy 1990  . Vesicovaginal fistula closure w/ tah 1983  . Tmj arthroplasty     History  Smoking status  . Current Some Day Smoker -- 0.5 packs/day for 15 years  . Types: Cigarettes  . Last Attempt to Quit: 05/15/2011  Smokeless tobacco  . Never Used    History  Alcohol Use No    Family History  Problem Relation Age of Onset  . Heart disease Father   . Emphysema Mother     smoked  . Asthma Mother   . Heart disease Paternal Grandfather   . Breast cancer Mother     Review of Systems: The review of systems is  positive for pelvic pain . She reports that she is using hydrocodone more frequently than prescribed.   All other systems were reviewed and are  negative.  Physical Exam: BP 150/98  Pulse 87  Ht 5' (1.524 m)  Wt 157 lb (71.215 kg)  BMI 30.66 kg/m2 Patient is alert and in no acute distress. Skin is warm and dry. Color is normal.  HEENT is unremarkable. Normocephalic/atraumatic. PERRL. Sclera are nonicteric. Neck is supple. No masses. No JVD. Lungs are coarse. Cardiac exam shows a regular rate and rhythm. Abdomen is soft. Extremities are without edema. Gait and ROM are intact. No gross neurologic deficits noted.  LABORATORY DATA:     Assessment / Plan:

## 2011-09-06 NOTE — Assessment & Plan Note (Signed)
She reports she resumed smoking because of multiple life stressors. She understands the importance of smoking cessation.

## 2011-09-12 ENCOUNTER — Telehealth: Payer: Self-pay | Admitting: Cardiology

## 2011-09-12 DIAGNOSIS — J189 Pneumonia, unspecified organism: Secondary | ICD-10-CM | POA: Diagnosis not present

## 2011-09-12 DIAGNOSIS — R0789 Other chest pain: Secondary | ICD-10-CM | POA: Diagnosis not present

## 2011-09-12 DIAGNOSIS — R079 Chest pain, unspecified: Secondary | ICD-10-CM | POA: Diagnosis not present

## 2011-09-12 DIAGNOSIS — I2510 Atherosclerotic heart disease of native coronary artery without angina pectoris: Secondary | ICD-10-CM | POA: Diagnosis not present

## 2011-09-12 DIAGNOSIS — I10 Essential (primary) hypertension: Secondary | ICD-10-CM | POA: Diagnosis not present

## 2011-09-12 DIAGNOSIS — F17200 Nicotine dependence, unspecified, uncomplicated: Secondary | ICD-10-CM | POA: Diagnosis not present

## 2011-09-12 DIAGNOSIS — E785 Hyperlipidemia, unspecified: Secondary | ICD-10-CM | POA: Diagnosis not present

## 2011-09-12 DIAGNOSIS — E039 Hypothyroidism, unspecified: Secondary | ICD-10-CM | POA: Diagnosis not present

## 2011-09-12 NOTE — Telephone Encounter (Signed)
Patient's son called, wanted Dr.Jordan to know he had to take mother to [facility] with chest pain.Son was told Dr.Jordan out of office today will let him know 09/13/11.

## 2011-09-12 NOTE — Telephone Encounter (Signed)
New msg Pt 's son called and said that she is at [facility] hospital emergency room.

## 2011-09-20 DIAGNOSIS — R310 Gross hematuria: Secondary | ICD-10-CM | POA: Diagnosis not present

## 2011-09-20 DIAGNOSIS — R109 Unspecified abdominal pain: Secondary | ICD-10-CM | POA: Diagnosis not present

## 2011-09-20 DIAGNOSIS — N3946 Mixed incontinence: Secondary | ICD-10-CM | POA: Diagnosis not present

## 2011-09-24 ENCOUNTER — Encounter (HOSPITAL_COMMUNITY): Payer: Self-pay | Admitting: Emergency Medicine

## 2011-09-24 ENCOUNTER — Other Ambulatory Visit: Payer: Self-pay

## 2011-09-24 ENCOUNTER — Emergency Department (HOSPITAL_COMMUNITY): Payer: Medicare Other

## 2011-09-24 ENCOUNTER — Inpatient Hospital Stay (HOSPITAL_COMMUNITY)
Admission: EM | Admit: 2011-09-24 | Discharge: 2011-09-26 | DRG: 392 | Disposition: A | Discharge status: Home / Self Care | Payer: Medicare Other | Source: Ambulatory Visit | Attending: Cardiology | Admitting: Cardiology

## 2011-09-24 DIAGNOSIS — F329 Major depressive disorder, single episode, unspecified: Secondary | ICD-10-CM | POA: Diagnosis present

## 2011-09-24 DIAGNOSIS — G43909 Migraine, unspecified, not intractable, without status migrainosus: Secondary | ICD-10-CM | POA: Diagnosis present

## 2011-09-24 DIAGNOSIS — E785 Hyperlipidemia, unspecified: Secondary | ICD-10-CM

## 2011-09-24 DIAGNOSIS — D649 Anemia, unspecified: Secondary | ICD-10-CM | POA: Diagnosis present

## 2011-09-24 DIAGNOSIS — R079 Chest pain, unspecified: Secondary | ICD-10-CM | POA: Diagnosis not present

## 2011-09-24 DIAGNOSIS — K3184 Gastroparesis: Principal | ICD-10-CM | POA: Diagnosis present

## 2011-09-24 DIAGNOSIS — Z87440 Personal history of urinary (tract) infections: Secondary | ICD-10-CM

## 2011-09-24 DIAGNOSIS — I2510 Atherosclerotic heart disease of native coronary artery without angina pectoris: Secondary | ICD-10-CM | POA: Diagnosis not present

## 2011-09-24 DIAGNOSIS — R0789 Other chest pain: Secondary | ICD-10-CM | POA: Diagnosis not present

## 2011-09-24 DIAGNOSIS — I10 Essential (primary) hypertension: Secondary | ICD-10-CM

## 2011-09-24 DIAGNOSIS — F17200 Nicotine dependence, unspecified, uncomplicated: Secondary | ICD-10-CM | POA: Diagnosis present

## 2011-09-24 DIAGNOSIS — G8929 Other chronic pain: Secondary | ICD-10-CM | POA: Diagnosis present

## 2011-09-24 DIAGNOSIS — R319 Hematuria, unspecified: Secondary | ICD-10-CM | POA: Diagnosis present

## 2011-09-24 DIAGNOSIS — I517 Cardiomegaly: Secondary | ICD-10-CM | POA: Diagnosis present

## 2011-09-24 DIAGNOSIS — Z9861 Coronary angioplasty status: Secondary | ICD-10-CM

## 2011-09-24 DIAGNOSIS — E876 Hypokalemia: Secondary | ICD-10-CM | POA: Diagnosis present

## 2011-09-24 DIAGNOSIS — F4310 Post-traumatic stress disorder, unspecified: Secondary | ICD-10-CM | POA: Diagnosis present

## 2011-09-24 DIAGNOSIS — Z7982 Long term (current) use of aspirin: Secondary | ICD-10-CM

## 2011-09-24 DIAGNOSIS — I200 Unstable angina: Secondary | ICD-10-CM | POA: Diagnosis not present

## 2011-09-24 DIAGNOSIS — Z7902 Long term (current) use of antithrombotics/antiplatelets: Secondary | ICD-10-CM

## 2011-09-24 DIAGNOSIS — Z23 Encounter for immunization: Secondary | ICD-10-CM

## 2011-09-24 DIAGNOSIS — Z79899 Other long term (current) drug therapy: Secondary | ICD-10-CM

## 2011-09-24 DIAGNOSIS — E039 Hypothyroidism, unspecified: Secondary | ICD-10-CM | POA: Diagnosis present

## 2011-09-24 DIAGNOSIS — Z951 Presence of aortocoronary bypass graft: Secondary | ICD-10-CM

## 2011-09-24 DIAGNOSIS — F3289 Other specified depressive episodes: Secondary | ICD-10-CM | POA: Diagnosis present

## 2011-09-24 HISTORY — DX: Post-traumatic stress disorder, unspecified: F43.10

## 2011-09-24 HISTORY — DX: Gastro-esophageal reflux disease without esophagitis: K21.9

## 2011-09-24 HISTORY — DX: Other complications of anesthesia, initial encounter: T88.59XA

## 2011-09-24 HISTORY — DX: Encounter for other specified aftercare: Z51.89

## 2011-09-24 HISTORY — DX: Depression, unspecified: F32.A

## 2011-09-24 HISTORY — DX: Anxiety disorder, unspecified: F41.9

## 2011-09-24 HISTORY — DX: Major depressive disorder, single episode, unspecified: F32.9

## 2011-09-24 HISTORY — DX: Adverse effect of unspecified anesthetic, initial encounter: T41.45XA

## 2011-09-24 HISTORY — DX: Embolism and thrombosis of unspecified artery: I74.9

## 2011-09-24 HISTORY — DX: Gastroparesis: K31.84

## 2011-09-24 HISTORY — DX: Anemia, unspecified: D64.9

## 2011-09-24 HISTORY — DX: Gastrointestinal hemorrhage, unspecified: K92.2

## 2011-09-24 HISTORY — DX: Reserved for inherently not codable concepts without codable children: IMO0001

## 2011-09-24 HISTORY — DX: Migraine, unspecified, not intractable, without status migrainosus: G43.909

## 2011-09-24 LAB — DIFFERENTIAL
Basophils Absolute: 0 10*3/uL (ref 0.0–0.1)
Basophils Relative: 0 % (ref 0–1)
Eosinophils Absolute: 0.1 10*3/uL (ref 0.0–0.7)
Monocytes Relative: 5 % (ref 3–12)
Neutrophils Relative %: 51 % (ref 43–77)

## 2011-09-24 LAB — POCT I-STAT TROPONIN I: Troponin i, poc: 0 ng/mL (ref 0.00–0.08)

## 2011-09-24 LAB — BASIC METABOLIC PANEL
BUN: 10 mg/dL (ref 6–23)
GFR calc Af Amer: >90 mL/min (ref >90)
GFR calc non Af Amer: >90 mL/min (ref >90)
Potassium: 3.2 mEq/L — ABNORMAL LOW (ref 3.5–5.1)
Sodium: 141 mEq/L (ref 135–145)

## 2011-09-24 LAB — CBC
MCH: 31.4 pg (ref 26.0–34.0)
MCHC: 35.4 g/dL (ref 30.0–36.0)
Platelets: 261 10*3/uL (ref 150–400)
RDW: 13 % (ref 11.5–15.5)

## 2011-09-24 LAB — PROTIME-INR: Prothrombin Time: 13.4 seconds (ref 11.6–15.2)

## 2011-09-24 MED ORDER — NITROGLYCERIN IN D5W 200-5 MCG/ML-% IV SOLN
2.0000 ug/min | INTRAVENOUS | Status: DC
  Administered 2011-09-24: 5 ug/min via INTRAVENOUS
  Filled 2011-09-24: qty 250

## 2011-09-24 MED ORDER — ASPIRIN 81 MG PO CHEW
324.0000 mg | CHEWABLE_TABLET | ORAL | Status: AC
  Administered 2011-09-24: 324 mg via ORAL
  Filled 2011-09-24: qty 4

## 2011-09-24 MED ORDER — POTASSIUM CHLORIDE CRYS ER 20 MEQ PO TBCR
40.0000 meq | EXTENDED_RELEASE_TABLET | Freq: Once | ORAL | Status: AC
  Administered 2011-09-24: 40 meq via ORAL
  Filled 2011-09-24: qty 2

## 2011-09-24 NOTE — ED Notes (Signed)
Admitting MD at bedside, pt awaiting inpt beds assignment.  

## 2011-09-24 NOTE — ED Notes (Signed)
PT. REPORTS MID CHEST PAIN WITH SLIGHT SOB ONSET THIS EVENING , DENIES NAUSEA ,OR DIAPHORESIS. STATES TOOK 1 NTG SL PTA WITH SLIGHT RELIEF.

## 2011-09-24 NOTE — H&P (Addendum)
Admit date: 09/24/2011 Referring Physician  Dr. Deretha Emory Primary Cardiologist  Dr. [place] Chief complaint/reason for admission:  Chest pain  HPI: This is a 56yo WF with an extensive cardiac history as stated below in the PMH with history of CAD and CABG and then SVG to OM and SVG to RI who presented to ER with complaints of CP.  She has a history of chronic chest pain syndrome and her last cath in October of 2012 showed patent stents.  She says that about 1 week ago she started having more severe chest pain although no more frequent than her usual CP.  Today she had CP about 4:30pm and took 1 SL NTG and then came to the ER.  By the time she got to the ER she was pain free.  She denies any SOB, diaphoresis or nausea.  Currently she complains of a tightness in her chest that is a 3/10.  She does give a history of recently being diagnosed with gastroparesis and also is being worked up for hematuria by Urology.  She says she has had frank blood in her urine.    PMH:    Past Medical History  Diagnosis Date  . Chest pain, cardiac     Has chronic chest pain.   . Coronary artery disease     s/p CABG x 2 in [place], s/p DES to SVG to OM in 2009; s/p DES to SVG to ramus intermediate in March 2012, s/p DES to native ramus intermediate in September 2012; last cath in Oct 2012 showing patent stent, minimal disease with normal EF.  Marland Kitchen Hypertension     metoprolol stopped due to hypotension  . Hyperlipidemia   . Tobacco abuse   . History of sinus tachycardia   . Hypothyroidism   . Hx: UTI (urinary tract infection)     multiple  . Enterocolitis   . Hx of CABG 2002  . Presence of coronary artery bypass graft stent 2009    DES in the SVG to the OM  . S/P cardiac cath 2011    no intervention  . S/P cardiac cath March 2012    repeat stenting of the SVG to the intermediate  . S/P cardiac cath Sept 2012    DES to native ramus intermediate  . S/P cardiac cath Oct 2012    Stents patent, nonobstructive CAD,  normal EF  . Gastroparesis     PSH:    Past Surgical History  Procedure Date  . Coronary artery bypass graft 2002  . Cholecystectomy 1990  . Vesicovaginal fistula closure w/ tah 1983  . Tmj arthroplasty     ALLERGIES:   Ranitidine and Morphine and related  Prior to Admit Meds:   (Not in a hospital admission) Family HX:    Family History  Problem Relation Age of Onset  . Heart disease Father   . Emphysema Mother     smoked  . Asthma Mother   . Heart disease Paternal Grandfather   . Breast cancer Mother    Social HX:    History   Social History  . Marital Status: Widowed    Spouse Name: N/A    Number of Children: 2  . Years of Education: N/A   Occupational History  . Disabled    Social History Main Topics  . Smoking status: Current Some Day Smoker -- 0.5 packs/day for 15 years    Types: Cigarettes    Last Attempt to Quit: 05/15/2011  . Smokeless tobacco: Never  Used  . Alcohol Use: No  . Drug Use: No  . Sexually Active: Not on file   Other Topics Concern  . Not on file   Social History Narrative  . No narrative on file     ROS:  All 11 ROS were addressed and are negative except what is stated in the HPI  PHYSICAL EXAM Filed Vitals:   09/24/11 1943  BP: 188/71  Pulse: 120  Temp: 99.2 F (37.3 C)  Resp: 18   General: Well developed, well nourished, in no acute distress Head: Eyes PERRLA, No xanthomas.   Normal cephalic and atramatic  Lungs:   Clear bilaterally to auscultation and percussion. Heart:   HRRR S1 S2 Pulses are 2+ & equal.            No carotid bruit. No JVD.  No abdominal bruits. No femoral bruits. Abdomen: Bowel sounds are positive, abdomen soft and non-tender without masses  Extremities:   No clubbing, cyanosis or edema.  DP +1 Neuro: Alert and oriented X 3. Psych:  Good affect, responds appropriately   Labs:   Lab Results  Component Value Date   WBC 8.5 09/24/2011   HGB 14.9 09/24/2011   HCT 42.1 09/24/2011   MCV 88.8 09/24/2011    PLT 261 09/24/2011    Lab 09/24/11 2026  NA 141  K 3.2*  CL 104  CO2 23  BUN 10  CREATININE 0.66  CALCIUM 9.9  PROT --  BILITOT --  ALKPHOS --  ALT --  AST --  GLUCOSE 100*   Lab Results  Component Value Date   CKTOTAL 53 04/19/2011   CKMB 1.7 04/19/2011   TROPONINI <0.30 04/19/2011   No results found for this basename: PTT   Lab Results  Component Value Date   INR 1.00 09/24/2011   INR 1.05 04/18/2011   INR 0.92 03/08/2011     Lab Results  Component Value Date   CHOL 150 03/08/2011   CHOL 194 01/12/2011   CHOL  Value: 184        ATP III CLASSIFICATION:  <200     mg/dL   Desirable  161-096  mg/dL   Borderline High  >=045    mg/dL   High        10/01/8117   Lab Results  Component Value Date   HDL 40 03/08/2011   HDL 41.60 01/12/2011   HDL 32* 09/07/2010   Lab Results  Component Value Date   LDLCALC 64 03/08/2011   LDLCALC 112* 01/12/2011   LDLCALC  Value: 111        Total Cholesterol/HDL:CHD Risk Coronary Heart Disease Risk Table                     Men   Women  1/2 Average Risk   3.4   3.3  Average Risk       5.0   4.4  2 X Average Risk   9.6   7.1  3 X Average Risk  23.4   11.0        Use the calculated Patient Ratio above and the CHD Risk Table to determine the patient's CHD Risk.        ATP III CLASSIFICATION (LDL):  <100     mg/dL   Optimal  147-829  mg/dL   Near or Above                    Optimal  130-159  mg/dL   Borderline  409-811  mg/dL   High  >914     mg/dL   Very High* 7/82/9562   Lab Results  Component Value Date   TRIG 231* 03/08/2011   TRIG 200.0* 01/12/2011   TRIG 206* 09/07/2010   Lab Results  Component Value Date   CHOLHDL 3.8 03/08/2011   CHOLHDL 5 01/12/2011   CHOLHDL 5.8 09/07/2010   No results found for this basename: LDLDIRECT      Radiology:  *RADIOLOGY REPORT*  Clinical Data: Chest pain.  CHEST - 2 VIEW  Comparison: 06/15/2011.  Findings: The cardiac silhouette, mediastinal and hilar contours  are within normal limits and stable. There are  stable surgical  changes from bypass surgery. Coronary artery stents are noted.  The lungs are clear. No pleural effusion. The bony thorax is  intact.  IMPRESSION:  No acute cardiopulmonary findings.  Original Report Authenticated By: P. Loralie Champagne, M.D.   EKG:  NSR with bi atrial enlargement and mild inferior ST depression  ASSESSMENT:  1.  Unstable angina with negative enzymes x1 2.  CAD with remote CABG and PCI of SVG to RI and SVG to OM 3.  HTN 4.  Chronic chest pain 5.  Dyslipidemia 6.  Ongoing tobacco use 7.  Gastroparesis - recently diagnosed 8.  Hematuria being worked up by Urology 9.  Hypokalemia  PLAN:   1.  Admit to tele bed 2.  Cycle cardiac enzymes 3.  Continue ASA/Plavix/beta blocker 4.  IV NTG gtt 5.  NPO after midnight 6.  Further workup per [facility] 7.  No Heparin unless cardiac enzymes positive due to history of recent hematuria 8.  Replete potassium 9.  BMET in am  Quintella Reichert, MD  09/24/2011  10:44 PM

## 2011-09-25 ENCOUNTER — Other Ambulatory Visit: Payer: Self-pay

## 2011-09-25 ENCOUNTER — Encounter (HOSPITAL_COMMUNITY): Payer: Self-pay | Admitting: General Practice

## 2011-09-25 ENCOUNTER — Inpatient Hospital Stay (HOSPITAL_COMMUNITY): Payer: Medicare Other

## 2011-09-25 DIAGNOSIS — G8929 Other chronic pain: Secondary | ICD-10-CM | POA: Diagnosis present

## 2011-09-25 DIAGNOSIS — E039 Hypothyroidism, unspecified: Secondary | ICD-10-CM | POA: Diagnosis present

## 2011-09-25 DIAGNOSIS — R079 Chest pain, unspecified: Secondary | ICD-10-CM

## 2011-09-25 DIAGNOSIS — F329 Major depressive disorder, single episode, unspecified: Secondary | ICD-10-CM | POA: Diagnosis present

## 2011-09-25 DIAGNOSIS — Z9861 Coronary angioplasty status: Secondary | ICD-10-CM | POA: Diagnosis not present

## 2011-09-25 DIAGNOSIS — D649 Anemia, unspecified: Secondary | ICD-10-CM | POA: Diagnosis present

## 2011-09-25 DIAGNOSIS — F4310 Post-traumatic stress disorder, unspecified: Secondary | ICD-10-CM | POA: Diagnosis present

## 2011-09-25 DIAGNOSIS — E876 Hypokalemia: Secondary | ICD-10-CM | POA: Diagnosis present

## 2011-09-25 DIAGNOSIS — Z23 Encounter for immunization: Secondary | ICD-10-CM | POA: Diagnosis not present

## 2011-09-25 DIAGNOSIS — Z7902 Long term (current) use of antithrombotics/antiplatelets: Secondary | ICD-10-CM | POA: Diagnosis not present

## 2011-09-25 DIAGNOSIS — I517 Cardiomegaly: Secondary | ICD-10-CM | POA: Diagnosis present

## 2011-09-25 DIAGNOSIS — Z951 Presence of aortocoronary bypass graft: Secondary | ICD-10-CM | POA: Diagnosis not present

## 2011-09-25 DIAGNOSIS — R319 Hematuria, unspecified: Secondary | ICD-10-CM | POA: Diagnosis not present

## 2011-09-25 DIAGNOSIS — R0789 Other chest pain: Secondary | ICD-10-CM | POA: Diagnosis not present

## 2011-09-25 DIAGNOSIS — F17200 Nicotine dependence, unspecified, uncomplicated: Secondary | ICD-10-CM | POA: Diagnosis present

## 2011-09-25 DIAGNOSIS — Z79899 Other long term (current) drug therapy: Secondary | ICD-10-CM | POA: Diagnosis not present

## 2011-09-25 DIAGNOSIS — I2510 Atherosclerotic heart disease of native coronary artery without angina pectoris: Secondary | ICD-10-CM | POA: Diagnosis not present

## 2011-09-25 DIAGNOSIS — Z87440 Personal history of urinary (tract) infections: Secondary | ICD-10-CM | POA: Diagnosis not present

## 2011-09-25 DIAGNOSIS — I10 Essential (primary) hypertension: Secondary | ICD-10-CM | POA: Diagnosis present

## 2011-09-25 DIAGNOSIS — F3289 Other specified depressive episodes: Secondary | ICD-10-CM | POA: Diagnosis present

## 2011-09-25 DIAGNOSIS — K3184 Gastroparesis: Secondary | ICD-10-CM | POA: Diagnosis not present

## 2011-09-25 DIAGNOSIS — E785 Hyperlipidemia, unspecified: Secondary | ICD-10-CM | POA: Diagnosis present

## 2011-09-25 DIAGNOSIS — Z7982 Long term (current) use of aspirin: Secondary | ICD-10-CM | POA: Diagnosis not present

## 2011-09-25 DIAGNOSIS — G43909 Migraine, unspecified, not intractable, without status migrainosus: Secondary | ICD-10-CM | POA: Diagnosis present

## 2011-09-25 HISTORY — PX: BIOPSY STOMACH: PRO33

## 2011-09-25 LAB — CARDIAC PANEL(CRET KIN+CKTOT+MB+TROPI)
Relative Index: INVALID (ref 0.0–2.5)
Relative Index: INVALID (ref 0.0–2.5)
Relative Index: INVALID (ref 0.0–2.5)
Total CK: 43 U/L (ref 7–177)
Total CK: 38 U/L (ref 7–177)
Troponin I: <0.3 ng/mL (ref <0.30)

## 2011-09-25 LAB — LIPID PANEL
Cholesterol: 144 mg/dL (ref 0–200)
LDL Cholesterol: 81 mg/dL (ref 0–99)
Total CHOL/HDL Ratio: 5.5 RATIO
VLDL: 37 mg/dL (ref 0–40)

## 2011-09-25 LAB — COMPREHENSIVE METABOLIC PANEL
AST: 13 U/L (ref 0–37)
Albumin: 4.2 g/dL (ref 3.5–5.2)
BUN: 9 mg/dL (ref 6–23)
Calcium: 9.4 mg/dL (ref 8.4–10.5)
Chloride: 104 mEq/L (ref 96–112)
Creatinine, Ser: 0.62 mg/dL (ref 0.50–1.10)
Total Bilirubin: 0.3 mg/dL (ref 0.3–1.2)
Total Protein: 6.5 g/dL (ref 6.0–8.3)

## 2011-09-25 LAB — MAGNESIUM: Magnesium: 1.8 mg/dL (ref 1.5–2.5)

## 2011-09-25 LAB — TSH: TSH: 4.343 u[IU]/mL (ref 0.350–4.500)

## 2011-09-25 LAB — APTT: aPTT: 25 seconds (ref 24–37)

## 2011-09-25 LAB — PROTIME-INR: INR: 1.07 (ref 0.00–1.49)

## 2011-09-25 MED ORDER — ALPRAZOLAM 0.5 MG PO TABS
1.0000 mg | ORAL_TABLET | Freq: Every day | ORAL | Status: DC | PRN
  Administered 2011-09-25 (×3): 1 mg via ORAL
  Filled 2011-09-25: qty 1
  Filled 2011-09-25 (×2): qty 2
  Filled 2011-09-25: qty 1

## 2011-09-25 MED ORDER — ASPIRIN EC 81 MG PO TBEC
81.0000 mg | DELAYED_RELEASE_TABLET | Freq: Every day | ORAL | Status: DC
  Administered 2011-09-25: 81 mg via ORAL
  Filled 2011-09-25 (×2): qty 1

## 2011-09-25 MED ORDER — ZOLPIDEM TARTRATE 5 MG PO TABS
10.0000 mg | ORAL_TABLET | Freq: Every evening | ORAL | Status: DC | PRN
  Administered 2011-09-25 (×2): 10 mg via ORAL
  Filled 2011-09-25 (×2): qty 2

## 2011-09-25 MED ORDER — POTASSIUM CHLORIDE CRYS ER 20 MEQ PO TBCR
40.0000 meq | EXTENDED_RELEASE_TABLET | Freq: Once | ORAL | Status: AC
  Administered 2011-09-25: 40 meq via ORAL
  Filled 2011-09-25: qty 2

## 2011-09-25 MED ORDER — NITROGLYCERIN 0.4 MG SL SUBL: 0.4000 mg | SUBLINGUAL_TABLET | SUBLINGUAL | Status: DC | PRN

## 2011-09-25 MED ORDER — REGADENOSON 0.4 MG/5ML IV SOLN
0.4000 mg | Freq: Once | INTRAVENOUS | Status: AC
  Administered 2011-09-25: 0.4 mg via INTRAVENOUS
  Filled 2011-09-25: qty 5

## 2011-09-25 MED ORDER — LEVOTHYROXINE SODIUM 25 MCG PO TABS
25.0000 ug | ORAL_TABLET | Freq: Every day | ORAL | Status: DC
  Administered 2011-09-25: 25 ug via ORAL
  Filled 2011-09-25 (×2): qty 1

## 2011-09-25 MED ORDER — ALPRAZOLAM 0.25 MG PO TABS: 0.2500 mg | ORAL_TABLET | Freq: Every evening | ORAL | Status: DC | PRN

## 2011-09-25 MED ORDER — TECHNETIUM TC 99M TETROFOSMIN IV KIT
10.0000 | PACK | Freq: Once | INTRAVENOUS | Status: AC | PRN
  Administered 2011-09-25: 10 via INTRAVENOUS

## 2011-09-25 MED ORDER — ONDANSETRON HCL 4 MG/2ML IJ SOLN
4.0000 mg | Freq: Four times a day (QID) | INTRAMUSCULAR | Status: DC | PRN
  Administered 2011-09-25: 4 mg via INTRAVENOUS
  Filled 2011-09-25: qty 2

## 2011-09-25 MED ORDER — ACETAMINOPHEN 325 MG PO TABS: 650.0000 mg | ORAL_TABLET | ORAL | Status: DC | PRN

## 2011-09-25 MED ORDER — PANTOPRAZOLE SODIUM 40 MG PO TBEC
40.0000 mg | DELAYED_RELEASE_TABLET | Freq: Two times a day (BID) | ORAL | Status: DC
  Administered 2011-09-25 (×3): 40 mg via ORAL
  Filled 2011-09-25 (×3): qty 1

## 2011-09-25 MED ORDER — PNEUMOCOCCAL VAC POLYVALENT 25 MCG/0.5ML IJ INJ
0.5000 mL | INJECTION | Freq: Once | INTRAMUSCULAR | Status: DC
  Filled 2011-09-25: qty 0.5

## 2011-09-25 MED ORDER — ASPIRIN 81 MG PO TABS: 81.0000 mg | ORAL_TABLET | Freq: Every day | ORAL | Status: DC

## 2011-09-25 MED ORDER — HYDROCODONE-ACETAMINOPHEN 5-325 MG PO TABS
1.0000 | ORAL_TABLET | Freq: Four times a day (QID) | ORAL | Status: DC | PRN
  Administered 2011-09-25: 1 via ORAL
  Filled 2011-09-25: qty 1

## 2011-09-25 MED ORDER — METOPROLOL TARTRATE 25 MG PO TABS
25.0000 mg | ORAL_TABLET | Freq: Two times a day (BID) | ORAL | Status: DC
  Administered 2011-09-25 (×3): 25 mg via ORAL
  Filled 2011-09-25 (×5): qty 1

## 2011-09-25 MED ORDER — BOOST / RESOURCE BREEZE PO LIQD: 1.0000 | Freq: Two times a day (BID) | ORAL | Status: DC | PRN

## 2011-09-25 MED ORDER — TECHNETIUM TC 99M TETROFOSMIN IV KIT
30.0000 | PACK | Freq: Once | INTRAVENOUS | Status: AC | PRN
  Administered 2011-09-25: 30 via INTRAVENOUS

## 2011-09-25 MED ORDER — ATORVASTATIN CALCIUM 40 MG PO TABS
40.0000 mg | ORAL_TABLET | Freq: Every day | ORAL | Status: DC
  Filled 2011-09-25 (×2): qty 1

## 2011-09-25 MED ORDER — CLOPIDOGREL BISULFATE 75 MG PO TABS
75.0000 mg | ORAL_TABLET | Freq: Every day | ORAL | Status: DC
  Administered 2011-09-25: 75 mg via ORAL
  Filled 2011-09-25: qty 1

## 2011-09-25 NOTE — ED Provider Notes (Addendum)
History     CSN: [id number]  Arrival date & time 09/24/11  1932   First MD Initiated Contact with Patient 09/24/11 2117      Chief Complaint  Patient presents with  . Chest Pain    (Consider location/radiation/quality/duration/timing/severity/associated sxs/prior treatment) Patient is a 56 y.o. female presenting with chest pain. The history is provided by the patient.  Chest Pain The chest pain began 3 - 5 hours ago. Chest pain occurs intermittently. The chest pain is resolved. At its most intense, the pain is at 3/10. The pain is currently at 0/10. The severity of the pain is moderate. The quality of the pain is described as dull and sharp. The pain radiates to the mid back. Primary symptoms include fatigue and shortness of breath. Pertinent negatives for primary symptoms include no fever, no syncope, no cough, no wheezing, no palpitations, no abdominal pain, no nausea, no vomiting and no dizziness.    Patient with known cardiac history status post 3 cardiac caths in 2012 at stents placed in September of 2012. Followed by Dr. Swaziland and Dr. Riley Kill from cardiology. Patient occasionally does have chest pain but today starting at 4:30 in the afternoon several episodes of intermittent chest pain radiating from substernal to the back area lasting longer than usual relieved some with nitroglycerin then would reoccur. Not associated with nausea vomiting or shortness of breath however patient has not been feeling good all day long. Currently no chest pain. Past Medical History  Diagnosis Date  . Chest pain, cardiac     Has chronic chest pain.   . Coronary artery disease     s/p CABG x 2 in [place], s/p DES to SVG to OM in 2009; s/p DES to SVG to ramus intermediate in March 2012, s/p DES to native ramus intermediate in September 2012; last cath in Oct 2012 showing patent stent, minimal disease with normal EF.  Marland Kitchen Hypertension     metoprolol stopped due to hypotension  . Hyperlipidemia   .  Tobacco abuse   . Hypothyroidism   . Hx: UTI (urinary tract infection)     multiple  . Enterocolitis   . Hx of CABG 2002  . Presence of coronary artery bypass graft stent 2009    DES in the SVG to the OM  . S/P cardiac cath 2011    no intervention  . S/P cardiac cath March 2012    repeat stenting of the SVG to the intermediate  . S/P cardiac cath Sept 2012    DES to native ramus intermediate  . S/P cardiac cath Oct 2012    Stents patent, nonobstructive CAD, normal EF  . Gastroparesis   . Complication of anesthesia     "my blood pressure will drop out on me"  . Dysrhythmia     sinus tachycardia  . Angina   . Myocardial infarction 2002  . 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 Anemia   . Blood transfusion   . GERD (gastroesophageal reflux disease)   . Hematuria 09/25/11    "lately"  . GI bleeding   . Anxiety   . Depression   . PTSD (post-traumatic stress disorder)   . Headache     "occasionally"  . Migraines     h/o    Past Surgical History  Procedure Date  . Vesicovaginal fistula closure w/ tah 1983  . Tmj arthroplasty 1980's    right side; "I've had to have it  twice"  . Coronary artery bypass graft 2002    CABG X2  . Coronary angioplasty with stent placement     "lots; they've all collapsed; last time dr took native vein &  turned it around & put stents in it before attaching"  . Abdominal hysterectomy 1980's  . Salpingoophorectomy 1980's    "2 surgeries; 1st right; then left"  . Biopsy stomach 09/25/11    "several in the last year; all negative; via endoscopy"  . Pilonidal cyst / sinus excision 1980's    "was wrapped around my spinal cord; Dr. Yvette Rack got almost all of it except a tiny bit; said it was really deep; packed fatty tissue in the hole; really bothers me alot"  . Cholecystectomy 2000's    Family History  Problem Relation Age of Onset  . Heart disease Father   . Emphysema Mother     smoked  . Asthma Mother   . Heart  disease Paternal Grandfather   . Breast cancer Mother     History  Substance Use Topics  . Smoking status: Current Everyday Smoker -- 0.5 packs/day for 15 years    Types: Cigarettes  . Smokeless tobacco: Never Used  . Alcohol Use: No    OB History    Grav Para Term Preterm Abortions TAB SAB Ect Mult Living                  Review of Systems  Constitutional: Positive for fatigue. Negative for fever.  HENT: Negative for congestion and neck pain.   Eyes: Negative for visual disturbance.  Respiratory: Positive for chest tightness and shortness of breath. Negative for cough and wheezing.   Cardiovascular: Positive for chest pain. Negative for palpitations and syncope.  Gastrointestinal: Negative for nausea, vomiting and abdominal pain.  Genitourinary: Negative for dysuria.  Musculoskeletal: Positive for back pain.  Skin: Negative for rash.  Neurological: Negative for dizziness and headaches.  Hematological: Does not bruise/bleed easily.    Allergies  Morphine and related and Ranitidine  Home Medications   Current Outpatient Rx  Name Route Sig Dispense Refill  . ALPRAZOLAM 1 MG PO TABS Oral Take 1 mg by mouth daily as needed. For anxiety    . ASPIRIN 81 MG PO TABS Oral Take 81 mg by mouth daily.      Marland Kitchen CLOPIDOGREL BISULFATE 75 MG PO TABS Oral Take 75 mg by mouth daily.     Marland Kitchen HYDROCODONE-ACETAMINOPHEN 5-325 MG PO TABS Oral Take 1 tablet by mouth every 6 (six) hours as needed. For pain    . LEVOTHYROXINE SODIUM 25 MCG PO TABS Oral Take 25 mcg by mouth daily.    Marland Kitchen METOPROLOL TARTRATE 25 MG PO TABS Oral Take 25 mg by mouth 2 (two) times daily.    Marland Kitchen NITROGLYCERIN 0.4 MG SL SUBL Sublingual Place 0.4 mg under the tongue every 5 (five) minutes as needed. For chest pain    . PANTOPRAZOLE SODIUM 40 MG PO TBEC Oral Take 40 mg by mouth 2 (two) times daily.      Marland Kitchen PROMETHAZINE HCL 25 MG PO TABS Oral Take 25 mg by mouth every 6 (six) hours as needed. For nausea    . ROSUVASTATIN CALCIUM  20 MG PO TABS Oral Take 20 mg by mouth daily.      Marland Kitchen ZOLPIDEM TARTRATE 10 MG PO TABS Oral Take 10 mg by mouth at bedtime as needed. For sleep    . ALPRAZOLAM 0.25 MG PO TABS Oral Take 1  tablet (0.25 mg total) by mouth at bedtime as needed for sleep. 30 tablet 0    BP 128/80  Pulse 76  Temp(Src) 99.2 F (37.3 C) (Oral)  Resp 15  SpO2 97%  Physical Exam  Nursing note and vitals reviewed. Constitutional: She is oriented to person, place, and time. She appears well-developed and well-nourished. No distress.  HENT:  Head: Normocephalic and atraumatic.  Mouth/Throat: Oropharynx is clear and moist.  Eyes: Conjunctivae are normal. Pupils are equal, round, and reactive to light.  Neck: Normal range of motion. Neck supple.  Cardiovascular: Normal rate, regular rhythm and normal heart sounds.   No murmur heard. Pulmonary/Chest: Effort normal and breath sounds normal. No respiratory distress. She has no wheezes. She has no rales. She exhibits no tenderness.  Abdominal: Soft. Bowel sounds are normal. There is no tenderness.  Musculoskeletal: Normal range of motion. She exhibits no edema and no tenderness.  Neurological: She is alert and oriented to person, place, and time. No cranial nerve deficit. She exhibits normal muscle tone. Coordination normal.  Skin: Skin is warm and dry. No rash noted. She is not diaphoretic. No erythema.    ED Course  Procedures (including critical care time)  Labs Reviewed  BASIC METABOLIC PANEL - Abnormal; Notable for the following:    Potassium 3.2 (*)    Glucose, Bld 100 (*)    All other components within normal limits  CBC  DIFFERENTIAL  PROTIME-INR  POCT I-STAT TROPONIN I   Dg Chest 2 View  09/24/2011  *RADIOLOGY REPORT*  Clinical Data: Chest pain.  CHEST - 2 VIEW  Comparison: 06/15/2011.  Findings: The cardiac silhouette, mediastinal and hilar contours are within normal limits and stable.  There are stable surgical changes from bypass surgery.  Coronary  artery stents are noted. The lungs are clear.  No pleural effusion.  The bony thorax is intact.  IMPRESSION: No acute cardiopulmonary findings.  Original Report Authenticated By: P. Loralie Champagne, M.D.   Results for orders placed during the hospital encounter of 09/24/11  CBC      Component Value Range   WBC 8.5  4.0 - 10.5 (K/uL)   RBC 4.74  3.87 - 5.11 (MIL/uL)   Hemoglobin 14.9  12.0 - 15.0 (g/dL)   HCT 11.9  14.7 - 82.9 (%)   MCV 88.8  78.0 - 100.0 (fL)   MCH 31.4  26.0 - 34.0 (pg)   MCHC 35.4  30.0 - 36.0 (g/dL)   RDW 56.2  13.0 - 86.5 (%)   Platelets 261  150 - 400 (K/uL)  DIFFERENTIAL      Component Value Range   Neutrophils Relative 51  43 - 77 (%)   Neutro Abs 4.3  1.7 - 7.7 (K/uL)   Lymphocytes Relative 43  12 - 46 (%)   Lymphs Abs 3.7  0.7 - 4.0 (K/uL)   Monocytes Relative 5  3 - 12 (%)   Monocytes Absolute 0.4  0.1 - 1.0 (K/uL)   Eosinophils Relative 1  0 - 5 (%)   Eosinophils Absolute 0.1  0.0 - 0.7 (K/uL)   Basophils Relative 0  0 - 1 (%)   Basophils Absolute 0.0  0.0 - 0.1 (K/uL)  BASIC METABOLIC PANEL      Component Value Range   Sodium 141  135 - 145 (mEq/L)   Potassium 3.2 (*) 3.5 - 5.1 (mEq/L)   Chloride 104  96 - 112 (mEq/L)   CO2 23  19 - 32 (mEq/L)  Glucose, Bld 100 (*) 70 - 99 (mg/dL)   BUN 10  6 - 23 (mg/dL)   Creatinine, Ser 1.61  0.50 - 1.10 (mg/dL)   Calcium 9.9  8.4 - 09.6 (mg/dL)   GFR calc non Af Amer >90  >90 (mL/min)   GFR calc Af Amer >90  >90 (mL/min)  PROTIME-INR      Component Value Range   Prothrombin Time 13.4  11.6 - 15.2 (seconds)   INR 1.00  0.00 - 1.49   POCT I-STAT TROPONIN I      Component Value Range   Troponin i, poc 0.00  0.00 - 0.08 (ng/mL)   Comment 3             Date: 09/25/2011  Rate: 123  Rhythm: sinus tachycardia  QRS Axis: normal  Intervals: normal  ST/T Wave abnormalities: ST depressions inferiorly  Conduction Disutrbances:none  Narrative Interpretation:   Old EKG Reviewed: changes noted Changes noted  compared to EKG from 04/19/2011 tachycardia present this time was ST segment depression inferiorly could represent sub-and endocardial injury.  1. Chest pain       MDM  Patient is followed by cardiology [facility]  service Dr. Swazil[place] and Dr. Riley Kill, patient with significant coronary artery disease multiple stents in the past including some just this past fall chest pain is increased in nature today from her normal baseline occasional chest pain concerning for unstable angina picture patient does take nitroglycerin for it does improve but then it comes back discussed with on call of our cardiology Dr. Felicie Morn came in and saw the patient is arranged for admission first cardiac marker was negative. Patient alert he had aspirin.  Patient will be started on a nitroglycerin drip for potential unstable angina. Potassium was low at 3.2 oral supplementation ordered for that. Admitting orders put in by cardiology.        Shelda Jakes, MD 09/25/11 0454  Shelda Jakes, MD 09/26/11 [phone]

## 2011-09-25 NOTE — Progress Notes (Signed)
INITIAL ADULT NUTRITION ASSESSMENT Date: 09/25/2011   Time: 3:57 PM  Reason for Assessment: Nutrition Risk Report  ASSESSMENT: Female 56 y.o.  Dx: chest pain  Hx:  Past Medical History  Diagnosis Date  . Chest pain, cardiac     Has chronic chest pain.   . Coronary artery disease     s/p CABG x 2 in [place], s/p DES to SVG to OM in 2009; s/p DES to SVG to ramus intermediate in March 2012, s/p DES to native ramus intermediate in September 2012; last cath in Oct 2012 showing patent stent, minimal disease with normal EF.  Marland Kitchen Hypertension     metoprolol stopped due to hypotension  . Hyperlipidemia   . Tobacco abuse   . Hypothyroidism   . Hx: UTI (urinary tract infection)     multiple  . Enterocolitis   . Hx of CABG 2002  . Presence of coronary artery bypass graft stent 2009    DES in the SVG to the OM  . S/P cardiac cath 2011    no intervention  . S/P cardiac cath March 2012    repeat stenting of the SVG to the intermediate  . S/P cardiac cath Sept 2012    DES to native ramus intermediate  . S/P cardiac cath Oct 2012    Stents patent, nonobstructive CAD, normal EF  . Gastroparesis   . Complication of anesthesia     "my blood pressure will drop out on me"  . Dysrhythmia     sinus tachycardia  . Angina   . Myocardial infarction 2002  . 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 Anemia   . Blood transfusion   . GERD (gastroesophageal reflux disease)   . Hematuria 09/25/11    "lately"  . GI bleeding   . Anxiety   . Depression   . PTSD (post-traumatic stress disorder)   . Headache     "occasionally"  . Migraines     h/o    Related Meds:     . aspirin  324 mg Oral NOW  . aspirin EC  81 mg Oral Daily  . atorvastatin  40 mg Oral q1800  . clopidogrel  75 mg Oral Daily  . levothyroxine  25 mcg Oral Daily  . metoprolol tartrate  25 mg Oral BID  . pantoprazole  40 mg Oral BID  . pneumococcal 23 valent vaccine  0.5 mL Intramuscular Once  . potassium  chloride  40 mEq Oral Once  . potassium chloride  40 mEq Oral Once  . regadenoson  0.4 mg Intravenous Once  . DISCONTD: aspirin  81 mg Oral Daily    Ht: 5' (152.4 cm)  Wt: 152 lb 8 oz (69.174 kg)  Ideal Wt: 45.4 kg % Ideal Wt: 151%  Usual Wt: 158 lb % Usual Wt: 96%  Body mass index is 29.78 kg/(m^2).  Food/Nutrition Related Hx: unintentional weight loss > 10 lbs within the past month per admission nutrition screen  Labs:  CMP     Component Value Date/Time   NA 139 09/25/2011 0118   K 3.2* 09/25/2011 0118   CL 104 09/25/2011 0118   CO2 23 09/25/2011 0118   GLUCOSE 103* 09/25/2011 0118   BUN 9 09/25/2011 0118   CREATININE 0.62 09/25/2011 0118   CALCIUM 9.4 09/25/2011 0118   PROT 6.5 09/25/2011 0118   ALBUMIN 4.2 09/25/2011 0118   AST 13 09/25/2011 0118   ALT 13 09/25/2011 0118  ALKPHOS 47 09/25/2011 0118   BILITOT 0.3 09/25/2011 0118   GFRNONAA >90 09/25/2011 0118   GFRAA >90 09/25/2011 0118    Diet Order: Carbohydrate Modified Medium Calorie  Supplements/Tube Feeding: N/A  IVF:    DISCONTD: nitroGLYCERIN Last Rate: 5 mcg/min (09/24/11 2320)    Estimated Nutritional Needs:   Kcal: 1700-1900 Protein: 80-90 gm Fluid: 1.7-1.9 L  RD spoke with pt re: nutrition hx -- states her appetite and PO intake is variable given gastroparesis symptoms; reports weight loss for the past 3 months; per office visit records pt's weight was 158 lb in December 2012 -- has had a 4% weight loss, however this is not significant for time frame; she ate a little bit of lunch today, however she anticipates her intake to be suboptimal -- RD offered clear liquid supplement given tolerance issues -- pt amenable   NUTRITION DIAGNOSIS: -Predicted suboptimal energy intake (NI-1.6).  Status: Ongoing  RELATED TO: gastroparesis  AS EVIDENCE BY: pt report  MONITORING/EVALUATION(Goals): Goal: meet >90% of estimated nutrition needs to optimize nutritional status Monitor: PO intake, weight, labs, I/O's  EDUCATION  NEEDS: -No education needs identified at this time  INTERVENTION:  Add Nurse, adult supplement PRN (per pt request)  RD to follow for nutrition care plan  Dietitian #: [phone]  DOCUMENTATION CODES Per approved criteria  -Not Applicable    Alger Memos 09/25/2011, 3:57 PM

## 2011-09-25 NOTE — Progress Notes (Signed)
Cardiology Progress Note Patient Name: [name] Date of Encounter: 09/25/2011, 11:36 AM     Subjective  Patient seen briefly in nuclear stress room.   Pre Test: VSS. EKG NSR 61bpm, TWI V1. Pt denies cp or sob.  During Test: VSS. EKG with some diffuse mild ST/T flattening. C/o mild chest discomfort, nausea, sob, increased heart rate and urge to defecate.  Post Test: VSS. EKG returning to baseline. Symptoms returning to baseline.  Await interpretation of images.    Objective   Telemetry: Unable to assess as she was seen in nuc med  Medications: . aspirin  324 mg Oral NOW  . aspirin EC  81 mg Oral Daily  . atorvastatin  40 mg Oral q1800  . clopidogrel  75 mg Oral Daily  . levothyroxine  25 mcg Oral Daily  . metoprolol tartrate  25 mg Oral BID  . pantoprazole  40 mg Oral BID  . pneumococcal 23 valent vaccine  0.5 mL Intramuscular Once  . potassium chloride  40 mEq Oral Once  . potassium chloride  40 mEq Oral Once  . regadenoson  0.4 mg Intravenous Once   . DISCONTD: nitroGLYCERIN 5 mcg/min (09/24/11 2320)    Physical Exam: Temp:  [97.8 F (36.6 C)-99.2 F (37.3 C)] 97.8 F (36.6 C) (04/02 0500) Pulse Rate:  [69-120] 69  (04/02 0500) Resp:  [11-30] 18  (04/02 0500) BP: (103-188)/(69-88) 103/71 mmHg (04/02 0500) SpO2:  [96 %-98 %] 96 % (04/02 0500) FiO2 (%):  [21 %] 21 % (04/02 0121) Weight:  [152 lb 8 oz (69.174 kg)] 152 lb 8 oz (69.174 kg) (04/02 0105)  General: White female, appears older than stated age, in no acute distress. Head: Normocephalic, atraumatic, sclera non-icteric, nares are without discharge.  Neck: Supple. Negative for carotid bruits or JVD Lungs: Clear bilaterally to auscultation without wheezes, rales, or rhonchi. Breathing is unlabored. Heart: RRR S1 S2 without murmurs, rubs, or gallops.  Abdomen: Soft, non-tender, non-distended with normoactive bowel sounds. No rebound/guarding. No obvious abdominal masses. Msk:  Strength and tone  appear normal for age. Extremities: No edema. No clubbing or cyanosis. Distal pedal pulses are intact and equal bilaterally. Neuro: Alert and oriented X 3. Moves all extremities spontaneously. Psych:  Responds to questions appropriately with a normal affect.   Labs:  [facility] 09/25/11 0118 09/24/11 2026  NA 139 141  K 3.2* 3.2*  CL 104 104  CO2 23 23  GLUCOSE 103* 100*  BUN 9 10  CREATININE 0.62 0.66  CALCIUM 9.4 9.9  MG 1.8 --   Basename 09/25/11 0118  AST 13  ALT 13  ALKPHOS 47  BILITOT 0.3  PROT 6.5  ALBUMIN 4.2   Basename 09/24/11 2026  WBC 8.5  NEUTROABS 4.3  HGB 14.9  HCT 42.1  MCV 88.8  PLT 261   Basename 09/25/11 0834 09/25/11 0118  CKTOTAL 38 43  CKMB 1.5 1.7  TROPONINI <0.30 <0.30   Basename 09/25/11 0118  CHOL 144  HDL 26*  LDLCALC 81  TRIG 161*  CHOLHDL 5.5   Basename 09/25/11 0118  TSH 4.343    Radiology/Studies:   09/24/2011 - CXR Findings: The cardiac silhouette, mediastinal and hilar contours are within normal limits and stable.  There are stable surgical changes from bypass surgery.  Coronary artery stents are noted. The lungs are clear.  No pleural effusion.  The bony thorax is intact.  IMPRESSION: No acute cardiopulmonary findings.      Assessment and  Plan  56 y.o. female w/ PMHx significant for CAD s/p CABG & subsequent graft PCIs, tobacco abuse, HTN, and HLD who presented to [facility] on 09/24/11 with complaints of chest pain.   1. Unstable Angina: She has an extensive cardiac history with CABG and subsequent PCIs to grafts. Last cath in Oct 2012 showed patent stents and minimal disease with normal EF. She has chronic chest pain syndrome, but presented with more severe and more frequent chest pain requiring further ischemic evaluation. EKG showed mild inferior ST depression. Cardiac enzymes negative. CXR w/o acute findings. Undergoing Lexiscan myoview this am. Further plans pending results. Cont ASA, plavix, BB, statin. No  heparin with negative enzymes and recent h/o hematuria.  2. Hypokalemia: K+ 3.2 on admission, given last night and again was 3.2 this morning. Has received more this morning.  3. HLD: LDL 81. Cont statin  4. HTN: Stable. Cont current meds.  5. Tobacco abuse: She needs to stop smoking! Tobacco cessation counseling ordered.  Signed, Shakeia Krus PA-C

## 2011-09-25 NOTE — Progress Notes (Signed)
Patient seen and examined and history reviewed. Agree with above findings and plan. Patient presents with chest pain. No clear response to nitrates. Still has low grade pain. Initial cardiac enzymes are negative. Exam reveals normal pulmonary and cardiac exam. No clear musculoskeletal pain. Will proceed with Steffanie Dunn today. If negative will treat medically. Last cath in 10/12 was OK. Patient does have gastroparesis and this may be contributing to her chest pain syndrome.  Thedora Hinders 09/25/2011 12:16 PM

## 2011-09-25 NOTE — Progress Notes (Signed)
Clinical Social Work Department BRIEF PSYCHOSOCIAL ASSESSMENT 09/25/2011  Patient:  [name], [name]     Account Number:  [id number]     Admit date:  09/24/2011  Clinical Social Worker:  Hulan Fray  Date/Time:  09/25/2011 03:33 PM  Referred by:  RN  Date Referred:  09/25/2011 Referred for  Advanced Directives   Other Referral:   Interview type:  Patient Other interview type:    PSYCHOSOCIAL DATA Living Status:  FAMILY Admitted from facility:   Level of care:   Primary support name:  Elray Mcgregor and Leonette Most Primary support relationship to patient:  FAMILY Degree of support available:   Supportive    CURRENT CONCERNS Current Concerns  Other - See comment   Other Concerns:   Patient requested advance directive and also stated concerns regarding her medications    SOCIAL WORK ASSESSMENT / PLAN Clinical Social Worker received referral for advance directive request. CSW provided patient with advance directive packet and explained the documents inside. Patient stated she knew what each document was and what it entailed. Patient stated that her husband died in his 21's from cancer. Patient stated that, that situation was very traumatic for her children and she does not want them to go through the same thing with her because she has medical complications. Patient also reported concerns about running out of her medications. CSW provided patient with advance directive packet, MOST form to fill out with her physician, and informed CM about patient's medication needs. CSW also informed patient of places that can notarize the documents once they are completed. CSW will sign off as social work intervention is no longer needed.    Assessment/plan status:  No Further Intervention Required Other assessment/ plan:   Information/referral to community resources:   Advance directive packet and MOST form.    PATIENT'S/FAMILY'S RESPONSE TO PLAN OF CARE: Patient was appreciative of CSW's  assistance with the advance directive packet and agreeable to CM assistance with medication needs.

## 2011-09-25 NOTE — Consult Note (Signed)
Pt smokes 1/2 ppd and says she wants to quit but currently in the contemplation stage. She is not interested in using any pharmacotherapy agents or NRT's to help her with quitting. Encouraged pt to quit. Referred to 1-800 quit now for f/u and support. Discussed oral fixation substitutes, second hand smoke and in home smoking policy. Reviewed and gave pt Written education/contact information.

## 2011-09-25 NOTE — Progress Notes (Signed)
Clinical Social Worker attempted to speak with patient regarding advance directive request, but she is in a procedure right now. CSW will re-attempt later today.   [name] MSW, [employer] [phone]

## 2011-09-26 ENCOUNTER — Encounter (HOSPITAL_COMMUNITY): Payer: Self-pay | Admitting: Physician Assistant

## 2011-09-26 LAB — BASIC METABOLIC PANEL
BUN: 10 mg/dL (ref 6–23)
Creatinine, Ser: 0.67 mg/dL (ref 0.50–1.10)
GFR calc Af Amer: >90 mL/min (ref >90)
GFR calc non Af Amer: >90 mL/min (ref >90)
Potassium: 3.8 mEq/L (ref 3.5–5.1)

## 2011-09-26 MED ORDER — METOPROLOL TARTRATE 25 MG PO TABS: 25.0000 mg | ORAL_TABLET | Freq: Two times a day (BID) | ORAL | Status: DC

## 2011-09-26 MED ORDER — CLOPIDOGREL BISULFATE 75 MG PO TABS: 75.0000 mg | ORAL_TABLET | Freq: Every day | ORAL | Status: DC

## 2011-09-26 MED ORDER — PANTOPRAZOLE SODIUM 40 MG PO TBEC: 40.0000 mg | DELAYED_RELEASE_TABLET | Freq: Two times a day (BID) | ORAL | Status: DC

## 2011-09-26 MED ORDER — ROSUVASTATIN CALCIUM 20 MG PO TABS: 20.0000 mg | ORAL_TABLET | Freq: Every day | ORAL | Status: DC

## 2011-09-26 MED ORDER — NITROGLYCERIN 0.4 MG SL SUBL: 0.4000 mg | SUBLINGUAL_TABLET | SUBLINGUAL | Status: DC | PRN

## 2011-09-26 NOTE — Discharge Summary (Signed)
Discharge Summary   Patient ID: [name] MRN: [id number], DOB/AGE: 54-Jun-1957 54 y.o. Admit date: 09/24/2011 D/C date:     09/26/2011   Primary Discharge Diagnoses:  1. Chest pain, atypical, possibly GI in nature - cardiac enzymes negative - indeterminant myoview but no perfusion defect in the ramus intermediate distribution which is the only area of concern  2. Recent hematuria, currently being worked up by urology 3. Gastroparesis, being worked up by GI  Secondary Discharge Diagnoses:  1. Coronary artery disease.  - s/p CABG x 2 in [place] 2002 -  stenting to the saphenous vein graft to the obtuse marginal in 2009.  - s/p DES to the saphenous vein graft to the ramus in March 2012 - s/p PTCA/DES to native ramus intermedius in September 2012.  - last cath 03/2011 showing minimal nonobstructive coronary artery disease with stents in the ramus intermedius branch still widely patent - normal left ventricular function by catheterization on April 19, 2011.  2.Hyperlipidemia  3. Mild anemia 4. Hypothyroidism 5. Tobacco abuse 6. History of urinary tract infections in the past 7. History of mastocytic enterocolitis  8. Hx of HTN with hx of hypotension, currently tolerating BB 9. Depression 10. PTSD 11. Migraines   Hospital Course: 56 y/o F with hx of CAD and CABG, chronic CP syndrome presented to [facility] with complaints of CP. About 1 week prior to admission she started having more severe CP though no more frequent than usual CP. On day of admission she took 1 SL NTG then came to the ER. She did not have any SOB, nausea or diaphoresis. Of note she recently was dx with gastroparesis, and is also being worked up for hematuria by urology. EKG showed NSR bi atrial enlargement and mild inferior ST depression. CXR showed no actue findings. Enzymes were initially negative. Her CP was initially felt secondary to unstable angina and she was admitted to the hospital with continuation  of ASA, Plavix, BB, as well as IV NTG. However there was no clear response to nitrates and it was questioned if her CP was related to her gastroparesis. Heparin was held pending enzymes due to recent hematuria. Potassium was repleted because of hypokalemia. Lexiscan nuclear stress testing was undertaken as enzymes remained negative. This was indeterminant due to increased gut uptake but there was no perfusion defect in the ramus intermediate distribution which is the only area of concern - her CP was felt atypical in nature and it was felt she should continue her follow-up with GI. Today she is is feeling well. Dr. Sw[place] has seen and examined her and feels she is stable for discharge.   Discharge Vitals: Blood pressure 132/96, pulse 67, temperature 98.5 F (36.9 C), temperature source Oral, resp. rate 16, height 5' (1.524 m), weight 152 lb 8 oz (69.174 kg), SpO2 97.00%.  Labs: Lab Results  Component Value Date   WBC 8.5 09/24/2011   HGB 14.9 09/24/2011   HCT 42.1 09/24/2011   MCV 88.8 09/24/2011   PLT 261 09/24/2011     Lab 09/26/11 0550 09/25/11 0118  NA 142 --  K 3.8 --  CL 111 --  CO2 23 --  BUN 10 --  CREATININE 0.67 --  CALCIUM 9.4 --  PROT -- 6.5  BILITOT -- 0.3  ALKPHOS -- 47  ALT -- 13  AST -- 13  GLUCOSE 100* --    Basename 09/25/11 1316 09/25/11 0834 09/25/11 0118  CKTOTAL 70 38 43  CKMB 1.5 1.5  1.7  TROPONINI <0.30 <0.30 <0.30   Lab Results  Component Value Date   CHOL 144 09/25/2011   HDL 26* 09/25/2011   LDLCALC 81 09/25/2011   TRIG 186* 09/25/2011     Diagnostic Studies/Procedures   1. Chest 2 View 09/24/2011  *RADIOLOGY REPORT*  Clinical Data: Chest pain.  CHEST - 2 VIEW  Comparison: 06/15/2011.  Findings: The cardiac silhouette, mediastinal and hilar contours are within normal limits and stable.  There are stable surgical changes from bypass surgery.  Coronary artery stents are noted. The lungs are clear.  No pleural effusion.  The bony thorax is intact.  IMPRESSION: No  acute cardiopulmonary findings.  Original Report Authenticated By: P. Loralie Champagne, M.D.   2. Nm Myocar Multi W/spect W/wall Motion / Ef 09/25/2011  This was a Lexiscan Myoview stress test supervised by the cardiology staff.  Chest pain was present pre-test and increased with pharmacologic stress.  Nonspecific  mild ST-T flatttening was noted during stress. No ischemic ST segment change was seen.  The quality of the images is not satisfactory for interpretation. There is a large amount of subdiaphragmatic isotope obscuring the inferolateral border of the heart on the stress images. The TID is calculated at 5.01 which is spurious.  The images at rest and with stress are not comparable because of the interfering background. No conclusion as to perfusion abnormalities can be drawn from this study.  Wall motion evaluation using the resting images shows normal wall motion and calculated EF of 52%.  Impression: Nondiagnostic Study.  Thomas A. Brackbill MD  Original Report Authenticated By: Heriberto Antigua    Discharge Medications   Medication List  As of 09/26/2011  8:05 AM   TAKE these medications         ALPRAZolam 1 MG tablet   Commonly known as: XANAX   Take 1 mg by mouth daily as needed. For anxiety      ALPRAZolam 0.25 MG tablet   Commonly known as: XANAX   Take 1 tablet (0.25 mg total) by mouth at bedtime as needed for sleep.      aspirin 81 MG tablet   Take 81 mg by mouth daily.      clopidogrel 75 MG tablet   Commonly known as: PLAVIX   Take 1 tablet (75 mg total) by mouth daily.      HYDROcodone-acetaminophen 5-325 MG per tablet   Commonly known as: NORCO   Take 1 tablet by mouth every 6 (six) hours as needed. For pain      levothyroxine 25 MCG tablet   Commonly known as: SYNTHROID, LEVOTHROID   Take 25 mcg by mouth daily.      metoprolol tartrate 25 MG tablet   Commonly known as: LOPRESSOR   Take 1 tablet (25 mg total) by mouth 2 (two) times daily.      nitroGLYCERIN 0.4 MG SL tablet    Commonly known as: NITROSTAT   Place 1 tablet (0.4 mg total) under the tongue every 5 (five) minutes as needed (up to 3 doses). For chest pain      pantoprazole 40 MG tablet   Commonly known as: PROTONIX   Take 1 tablet (40 mg total) by mouth 2 (two) times daily.      promethazine 25 MG tablet   Commonly known as: PHENERGAN   Take 25 mg by mouth every 6 (six) hours as needed. For nausea      rosuvastatin 20 MG tablet   Commonly known as: CRESTOR  Take 1 tablet (20 mg total) by mouth daily.      zolpidem 10 MG tablet   Commonly known as: AMBIEN   Take 10 mg by mouth at bedtime as needed. For sleep          refills given for cardiac meds as well as protonix.  Disposition   The patient will be discharged in stable condition to home. Discharge Orders    Future Orders Please Complete By Expires   Diet - low sodium heart healthy      Increase activity slowly      Discharge instructions      Comments:   We have provided you with 90-day supply of your heart-related medications. For refills of your other home medications, please contact your primary care doctor to maintain continuity of care and to make sure he/she does not need to see you before refilling them.     Follow-up Information    Follow up with Ashya Nicolaisen [place], MD. (Our office will call you with an appointment)    Contact information:   [address], [address] [place] [address] [phone]       Follow up with Gastroenterology. (Please continue to follow up with the gastrointestinal doctor for continued workup, as your chest pain may be related to this instead of your heart)       Follow up with Urology. (Please continue to follow up for the blood in your urine)       Follow up with Primary Care Doctor. (Your potassium level was slightly low when you came into the hospital. This may need to be rechecked by your primary doctor to make sure you do not need daily potassium supplementation.)              Duration of Discharge Encounter: Greater than 30 minutes including physician and PA time.  Signed, Ronie Spies PA-C 09/26/2011, 8:05 AM Patient seen and examined and history reviewed. Agree with above findings and plan. See earlier rounding note today.   Theron Arista JordanMD 09/26/2011 8:10 AM

## 2011-09-26 NOTE — Progress Notes (Addendum)
TELEMETRY: Reviewed telemetry pt in NSR Filed Vitals:   09/25/11 1157 09/25/11 1336 09/25/11 2100 09/26/11 0500  BP: 131/82 138/82 106/73 132/96  Pulse: 108 77 79 67  Temp:  98.7 F (37.1 C) 99.3 F (37.4 C) 98.5 F (36.9 C)  TempSrc:  Oral    Resp:  18 16 16   Height:      Weight:      SpO2:  95% 95% 97%    Intake/Output Summary (Last 24 hours) at 09/26/11 0703 Last data filed at 09/26/11 0500  Gross per 24 hour  Intake    240 ml  Output      0 ml  Net    240 ml    SUBJECTIVE Feels much better. Chest pain resolved except some residual soreness.  LABS: Basic Metabolic Panel:  Basename 09/26/11 0550 09/25/11 0118  NA 142 139  K 3.8 3.2*  CL 111 104  CO2 23 23  GLUCOSE 100* 103*  BUN 10 9  CREATININE 0.67 0.62  CALCIUM 9.4 9.4  MG -- 1.8  PHOS -- --   Liver Function Tests:  Basename 09/25/11 0118  AST 13  ALT 13  ALKPHOS 47  BILITOT 0.3  PROT 6.5  ALBUMIN 4.2   No results found for this basename: LIPASE:2,AMYLASE:2 in the last 72 hours CBC:  Basename 09/24/11 2026  WBC 8.5  NEUTROABS 4.3  HGB 14.9  HCT 42.1  MCV 88.8  PLT 261   Cardiac Enzymes:  Basename 09/25/11 1316 09/25/11 0834 09/25/11 0118  CKTOTAL 70 38 43  CKMB 1.5 1.5 1.7  CKMBINDEX -- -- --  TROPONINI <0.30 <0.30 <0.30   BNP: No components found with this basename: POCBNP:3 D-Dimer: No results found for this basename: DDIMER:2 in the last 72 hours Hemoglobin A1C: No results found for this basename: HGBA1C in the last 72 hours Fasting Lipid Panel:  Basename 09/25/11 0118  CHOL 144  HDL 26*  LDLCALC 81  TRIG 409*  CHOLHDL 5.5  LDLDIRECT --   Thyroid Function Tests:  Basename 09/25/11 0118  TSH 4.343  T4TOTAL --  T3FREE --  THYROIDAB --   Anemia Panel: No results found for this basename: VITAMINB12,FOLATE,FERRITIN,TIBC,IRON,RETICCTPCT in the last 72 hours  Radiology/Studies:  Dg Chest 2 View  09/24/2011  *RADIOLOGY REPORT*  Clinical Data: Chest pain.  CHEST - 2  VIEW  Comparison: 06/15/2011.  Findings: The cardiac silhouette, mediastinal and hilar contours are within normal limits and stable.  There are stable surgical changes from bypass surgery.  Coronary artery stents are noted. The lungs are clear.  No pleural effusion.  The bony thorax is intact.  IMPRESSION: No acute cardiopulmonary findings.  Original Report Authenticated By: P. Loralie Champagne, M.D.   Nm Myocar Multi W/spect W/wall Motion / Ef  09/25/2011  This was a Lexiscan Myoview stress test supervised by the cardiology staff.  Chest pain was present pre-test and increased with pharmacologic stress.  Nonspecific  mild ST-T flatttening was noted during stress. No ischemic ST segment change was seen.  The quality of the images is not satisfactory for interpretation. There is a large amount of subdiaphragmatic isotope obscuring the inferolateral border of the heart on the stress images. The TID is calculated at 5.01 which is spurious.  The images at rest and with stress are not comparable because of the interfering background. No conclusion as to perfusion abnormalities can be drawn from this study.  Wall motion evaluation using the resting images shows normal wall motion and calculated  EF of 52%.  Impression: Nondiagnostic Study.  Thomas A. Brackbill MD  Original Report Authenticated By: Heriberto Antigua    PHYSICAL EXAM General: Well developed, well nourished, in no acute distress. Head: Normocephalic, atraumatic, sclera non-icteric, no xanthomas, nares are without discharge. Neck: Negative for carotid bruits. JVD not elevated. Lungs: Clear bilaterally to auscultation without wheezes, rales, or rhonchi. Breathing is unlabored. Heart: RRR S1 S2 without murmurs, rubs, or gallops.  Abdomen: Soft, non-tender, non-distended with normoactive bowel sounds. No hepatomegaly. No rebound/guarding. No obvious abdominal masses. Msk:  Strength and tone appears normal for age. Extremities: No clubbing, cyanosis or edema.   Distal pedal pulses are 2+ and equal bilaterally. Neuro: Alert and oriented X 3. Moves all extremities spontaneously. Psych:  Responds to questions appropriately with a normal affect.  ASSESSMENT AND PLAN: 1. Acute chest pain. Patient has ruled out for MI. Chest pain much better and is atypical. Myoview is indeterminate due to increased gut uptake but there is no perfusion defect in the ramus intermediate distribution which is the only area of concern. 2. CAD s/p single vessel CABG. S/p stenting of SVG to ramus intermediate x2 with subsequent occlusion of SVG. S/p stenting of native ramus intermediate with DES 9/12. Cath 10/12 no obstructive disease. 3. Gastroparesis- being evaluated by GI 4. Htn 5. Hyperlipidemia 6. Tobacco use.  Plan: will discharge today on prior meds. Patient wants a 90 day supply of all her meds. Needs new Rx. For sl Ntg. Proceed with GI evaluation.    Active Problems:  * No active hospital problems. *     Signed, Nillie Bartolotta [place] MD,FACC 09/26/2011  7:09 AM

## 2011-09-26 NOTE — Plan of Care (Signed)
Problem: Phase II Progression Outcomes Goal: CV Risk Factors identified Outcome: Completed/Met Date Met:  09/26/11 CV risk factors:  CAD s/p CABG, HTN, hyperlipidemia, anemia, history of blood clotting, tobacco use

## 2011-09-26 NOTE — Discharge Instructions (Signed)
Nitroglycerin sublingual tablets What is this medicine? NITROGLYCERIN (nye troe GLI ser in) is a type of vasodilator. It relaxes blood vessels, increasing the blood and oxygen supply to your heart. This medicine is used to relieve chest pain caused by angina. It is also used to prevent chest pain before activities like climbing stairs, going outdoors in cold weather, or sexual activity. This medicine may be used for other purposes; ask your health care provider or pharmacist if you have questions. What should I tell my health care provider before I take this medicine? They need to know if you have any of these conditions: -anemia -head injury, recent stroke, or bleeding in the brain -liver disease -previous heart attack -an unusual or allergic reaction to nitroglycerin, other medicines, foods, dyes, or preservatives -pregnant or trying to get pregnant -breast-feeding How should I use this medicine? Take this medicine by mouth as needed. At the first sign of an angina attack (chest pain or tightness) place one tablet under your tongue. You can also take this medicine 5 to 10 minutes before an event likely to produce chest pain. Follow the directions on the prescription label. Let the tablet dissolve under the tongue. Do not swallow whole. Replace the dose if you accidentally swallow it. It will help if your mouth is not dry. Saliva around the tablet will help it to dissolve more quickly. Do not eat or drink, smoke or chew tobacco while a tablet is dissolving. If you are not better within 5 minutes after taking ONE dose of nitroglycerin, call 9-1-1 immediately to seek emergency medical care. Do not take more than 3 nitroglycerin tablets over 15 minutes. If you take this medicine often to relieve symptoms of angina, your doctor or health care professional may provide you with different instructions to manage your symptoms. If symptoms do not go away after following these instructions, it is important to  call 9-1-1 immediately. Do not take more than 3 nitroglycerin tablets over 15 minutes. Talk to your pediatrician regarding the use of this medicine in children. Special care may be needed. Overdosage: If you think you have taken too much of this medicine contact a poison control center or emergency room at once. NOTE: This medicine is only for you. Do not share this medicine with others. What if I miss a dose? This does not apply. This medicine is only used as needed. What may interact with this medicine? Do not take this medicine with any of the following medications: -certain migraine medicines like ergotamine and dihydroergotamine (DHE) -medicines used to treat erectile dysfunction like sildenafil, tadalafil, and vardenafil This medicine may also interact with the following medications: -alteplase -aspirin -heparin -medicines for high blood pressure -medicines for mental depression -other medicines used to treat angina -phenothiazines like chlorpromazine, mesoridazine, prochlorperazine, thioridazine This list may not describe all possible interactions. Give your health care provider a list of all the medicines, herbs, non-prescription drugs, or dietary supplements you use. Also tell them if you smoke, drink alcohol, or use illegal drugs. Some items may interact with your medicine. What should I watch for while using this medicine? Tell your doctor or health care professional if you feel your medicine is no longer working. Keep this medicine with you at all times. Sit or lie down when you take your medicine to prevent falling if you feel dizzy or faint after using it. Try to remain calm. This will help you to feel better faster. If you feel dizzy, take several deep breaths and lie  down with your feet propped up, or bend forward with your head resting between your knees. You may get drowsy or dizzy. Do not drive, use machinery, or do anything that needs mental alertness until you know how this  drug affects you. Do not stand or sit up quickly, especially if you are an older patient. This reduces the risk of dizzy or fainting spells. Alcohol can make you more drowsy and dizzy. Avoid alcoholic drinks. Do not treat yourself for coughs, colds, or pain while you are taking this medicine without asking your doctor or health care professional for advice. Some ingredients may increase your blood pressure. What side effects may I notice from receiving this medicine? Side effects that you should report to your doctor or health care professional as soon as possible: -blurred vision -dry mouth -skin rash -sweating -the feeling of extreme pressure in the head -unusually weak or tired Side effects that usually do not require medical attention (report to your doctor or health care professional if they continue or are bothersome): -flushing of the face or neck -headache -irregular heartbeat, palpitations -nausea, vomiting This list may not describe all possible side effects. Call your doctor for medical advice about side effects. You may report side effects to FDA at 1-800-FDA-1088. Where should I keep my medicine? Keep out of the reach of children. Store at room temperature between 20 and 25 degrees C (68 and 77 degrees F). Store in Retail buyer. Protect from light and moisture. Keep tightly closed. Throw away any unused medicine after the expiration date. NOTE: This sheet is a summary. It may not cover all possible information. If you have questions about this medicine, talk to your doctor, pharmacist, or health care provider.  2012, [employer]vier/[employer] Standard. (01/02/2008 5:16:24 PM)

## 2011-10-05 DIAGNOSIS — R109 Unspecified abdominal pain: Secondary | ICD-10-CM | POA: Diagnosis not present

## 2011-10-05 DIAGNOSIS — R310 Gross hematuria: Secondary | ICD-10-CM | POA: Diagnosis not present

## 2011-10-24 ENCOUNTER — Telehealth: Payer: Self-pay | Admitting: Cardiology

## 2011-10-24 DIAGNOSIS — G8929 Other chronic pain: Secondary | ICD-10-CM | POA: Diagnosis not present

## 2011-10-24 DIAGNOSIS — I10 Essential (primary) hypertension: Secondary | ICD-10-CM | POA: Diagnosis not present

## 2011-10-24 DIAGNOSIS — M549 Dorsalgia, unspecified: Secondary | ICD-10-CM | POA: Diagnosis not present

## 2011-10-24 DIAGNOSIS — R110 Nausea: Secondary | ICD-10-CM | POA: Diagnosis not present

## 2011-10-24 MED ORDER — AMLODIPINE BESYLATE 5 MG PO TABS: 5.0000 mg | ORAL_TABLET | Freq: Every day | ORAL | Status: DC

## 2011-10-24 NOTE — Telephone Encounter (Signed)
Patient called stated for the past 2 days her blood pressure has been elevated 151/98 p-88 beats/min.States she has had a dull headache.Patient wanting to increase her metoprolol.Spoke with Norma Fredrickson NP she advised to continue metoprolol 25 mg twice daily and start norvasc 5 mg daily.Advised to call back if needed.

## 2011-10-24 NOTE — Telephone Encounter (Signed)
New Problem:     Patient called in wanting to know if she could take an extra blood pressure pill because her blood pressure is running high and she has a headache.

## 2011-11-07 DIAGNOSIS — R109 Unspecified abdominal pain: Secondary | ICD-10-CM | POA: Diagnosis not present

## 2011-11-07 DIAGNOSIS — K5900 Constipation, unspecified: Secondary | ICD-10-CM | POA: Diagnosis not present

## 2011-11-07 DIAGNOSIS — R110 Nausea: Secondary | ICD-10-CM | POA: Diagnosis not present

## 2011-11-23 ENCOUNTER — Ambulatory Visit: Payer: Medicare Other | Admitting: Cardiology

## 2011-12-10 DIAGNOSIS — N390 Urinary tract infection, site not specified: Secondary | ICD-10-CM | POA: Diagnosis not present

## 2011-12-10 DIAGNOSIS — Z1211 Encounter for screening for malignant neoplasm of colon: Secondary | ICD-10-CM | POA: Diagnosis not present

## 2011-12-10 DIAGNOSIS — I2510 Atherosclerotic heart disease of native coronary artery without angina pectoris: Secondary | ICD-10-CM | POA: Diagnosis not present

## 2011-12-18 ENCOUNTER — Ambulatory Visit (INDEPENDENT_AMBULATORY_CARE_PROVIDER_SITE_OTHER): Payer: Medicare Other | Admitting: Cardiology

## 2011-12-18 ENCOUNTER — Encounter: Payer: Self-pay | Admitting: Cardiology

## 2011-12-18 VITALS — BP 122/82 | HR 92 | Ht 60.0 in | Wt 154.0 lb

## 2011-12-18 DIAGNOSIS — E785 Hyperlipidemia, unspecified: Secondary | ICD-10-CM

## 2011-12-18 DIAGNOSIS — I2510 Atherosclerotic heart disease of native coronary artery without angina pectoris: Secondary | ICD-10-CM

## 2011-12-18 DIAGNOSIS — I10 Essential (primary) hypertension: Secondary | ICD-10-CM

## 2011-12-18 DIAGNOSIS — R079 Chest pain, unspecified: Secondary | ICD-10-CM | POA: Diagnosis not present

## 2011-12-18 NOTE — Progress Notes (Signed)
[name] Date of Birth: 1956-01-15 Medical Record #[id number]  History of Present Illness: [name] is seen today for a followup visit. Her cardiac status has been stable. She was last hospitalized in April with chest pain. She ruled out for myocardial infarction. Her Myoview study was somewhat indeterminate but showed normal perfusion in the ramus distribution. She had remote coronary bypass surgery in 2002. She had prior stenting of the vein graft to the obtuse marginal vessel in 2009. She then had a drug-eluting stent placement to this same saphenous vein graft to the ramus in March of 2012. This subsequently occluded and she underwent angioplasty and drug-eluting stent to the ramus intermediate branch in September of 2012. Cardiac catheterization again in October of 2012 showed nonobstructive disease. She continues to have episodes of chest pain about twice a week. She describes this as a sharper type pain with some squeezing or tight sensation. She will take up to 3 nitroglycerin with relief. A lot of times her pain is related to position or movement.  She has been evaluated at [facility] for gastroparesis. Further testing was recommended but she states she cannot afford it. She also has developed intermittent hematuria. She underwent evaluation with cystoscopy and a CT scan which apparently were okay. Her aspirin was discontinued and she has remained on Plavix. She is still experiencing some hematuria.  Current Outpatient Prescriptions on File Prior to Visit  Medication Sig Dispense Refill  . ALPRAZolam (XANAX) 1 MG tablet Take 1 mg by mouth daily as needed. For anxiety      . amLODipine (NORVASC) 5 MG tablet Take 1 tablet (5 mg total) by mouth daily.  30 tablet  11  . clopidogrel (PLAVIX) 75 MG tablet Take 1 tablet (75 mg total) by mouth daily.  90 tablet  2  . HYDROcodone-acetaminophen (NORCO) 5-325 MG per tablet Take 1 tablet by mouth every 6 (six) hours as needed. For pain        . levothyroxine (SYNTHROID, LEVOTHROID) 25 MCG tablet Take 25 mcg by mouth daily.      . metoprolol tartrate (LOPRESSOR) 25 MG tablet Take 1 tablet (25 mg total) by mouth 2 (two) times daily.  180 tablet  2  . nitroGLYCERIN (NITROSTAT) 0.4 MG SL tablet Place 1 tablet (0.4 mg total) under the tongue every 5 (five) minutes as needed (up to 3 doses). For chest pain  25 tablet  4  . pantoprazole (PROTONIX) 40 MG tablet Take 1 tablet (40 mg total) by mouth 2 (two) times daily.  180 tablet  1  . promethazine (PHENERGAN) 25 MG tablet Take 25 mg by mouth every 6 (six) hours as needed. For nausea      . rosuvastatin (CRESTOR) 20 MG tablet Take 1 tablet (20 mg total) by mouth daily.  90 tablet  2  . DISCONTD: ALPRAZolam (XANAX) 0.25 MG tablet Take 1 tablet (0.25 mg total) by mouth at bedtime as needed for sleep.  30 tablet  0    Allergies  Allergen Reactions  . Morphine And Related Other (See Comments)    Flushing & "makes me feel weird; that sets off a panic attack" Pain meds lowers blood pressure  . Ranitidine Hives  . Sulfa Antibiotics     Past Medical History  Diagnosis Date  . Chest pain, cardiac     Has chronic chest pain.   . Coronary artery disease     a) s/p CABG x 2 in [place] 2002. b)stenting to the SVG-OM  in 2009. c)DES to the saphenous vein graft to the ramus in March 2012. d) s/p PTCA/DES to native ramus intermedius in September 2012. Last cath 03/2011 with minimal nonobstructive disease  e) indeterminant myoview 09/2011 but atypical CP & no perfusion defect in ramus intermediate distribution, EF 52%  . Hypertension   . Hyperlipidemia   . Tobacco abuse   . Hypothyroidism   . Hx: UTI (urinary tract infection)     multiple  . Enterocolitis   . Gastroparesis   . Complication of anesthesia     "my blood pressure will drop out on me"  . 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 Anemia   . Blood transfusion   . GERD (gastroesophageal reflux disease)   .  Hematuria 09/25/11    "lately"  . GI bleeding   . Anxiety   . Depression   . PTSD (post-traumatic stress disorder)   . Migraines     h/o    Past Surgical History  Procedure Date  . Vesicovaginal fistula closure w/ tah 1983  . Tmj arthroplasty 1980's    right side; "I've had to have it twice"  . Coronary artery bypass graft 2002    CABG X2  . Coronary angioplasty with stent placement     "lots; they've all collapsed; last time dr took native vein &  turned it around & put stents in it before attaching"  . Abdominal hysterectomy 1980's  . Salpingoophorectomy 1980's    "2 surgeries; 1st right; then left"  . Biopsy stomach 09/25/11    "several in the last year; all negative; via endoscopy"  . Pilonidal cyst / sinus excision 1980's    "was wrapped around my spinal cord; Dr. Yvette Rack got almost all of it except a tiny bit; said it was really deep; packed fatty tissue in the hole; really bothers me alot"  . Cholecystectomy 2000's    History  Smoking status  . Current Everyday Smoker -- 0.5 packs/day for 15 years  . Types: Cigarettes  Smokeless tobacco  . Never Used    History  Alcohol Use No    Family History  Problem Relation Age of Onset  . Heart disease Father   . Emphysema Mother     smoked  . Asthma Mother   . Heart disease Paternal Grandfather   . Breast cancer Mother     Review of Systems: The review of systems is  positive for chronic pain. She is asking for refill of her hydrocodone today.  All other systems were reviewed and are negative.  Physical Exam: BP 122/82  Pulse 92  Ht 5' (1.524 m)  Wt 154 lb (69.854 kg)  BMI 30.08 kg/m2 Patient is alert and in no acute distress. Skin is warm and dry. Color is normal.  HEENT is unremarkable. Normocephalic/atraumatic. PERRL. Sclera are nonicteric. Neck is supple. No masses. No JVD. Lungs are coarse. Cardiac exam shows a regular rate and rhythm. Abdomen is soft. Extremities are without edema. Gait and ROM are  intact. No gross neurologic deficits noted.  LABORATORY DATA:     Assessment / Plan:

## 2011-12-18 NOTE — Assessment & Plan Note (Signed)
Blood pressure is quite good today. We'll continue on her current therapy.

## 2011-12-18 NOTE — Patient Instructions (Signed)
Continue your current medication  I will see you again in 3 months   

## 2011-12-18 NOTE — Assessment & Plan Note (Addendum)
Her most recent cardiac evaluation was unremarkable. She has multiple etiologies of her pain including musculoskeletal and GI. I recommended continuing on her current medical therapy. If she has significant acceleration of her pain in severity or duration we may eventually need to catheter again but for now we'll continue medical therapy. We will continue with Plavix only. Hold aspirin. I have not refilled her pain medication. This needs to be done by her primary care.

## 2012-01-15 DIAGNOSIS — R109 Unspecified abdominal pain: Secondary | ICD-10-CM | POA: Diagnosis not present

## 2012-01-15 DIAGNOSIS — R310 Gross hematuria: Secondary | ICD-10-CM | POA: Diagnosis not present

## 2012-01-17 ENCOUNTER — Telehealth: Payer: Self-pay | Admitting: Cardiology

## 2012-01-17 NOTE — Telephone Encounter (Signed)
New problem:  Per Pam calling from [facility]  patient having upcoming bladder procedure need to hold plavix.

## 2012-01-18 NOTE — Telephone Encounter (Signed)
Pam from [facility] called left message on voice mail Dr.Jordan wants patient to stay on plavix for bladder procedure.Do not stop plavix.

## 2012-01-25 ENCOUNTER — Other Ambulatory Visit: Payer: Self-pay | Admitting: Urology

## 2012-02-05 ENCOUNTER — Telehealth: Payer: Self-pay | Admitting: Cardiology

## 2012-02-05 NOTE — Telephone Encounter (Signed)
Forward 1 page from [facility] to Dr. Peter [place] for review on 02-05-12 ym

## 2012-02-11 DIAGNOSIS — L255 Unspecified contact dermatitis due to plants, except food: Secondary | ICD-10-CM | POA: Diagnosis not present

## 2012-02-15 NOTE — Telephone Encounter (Signed)
Spoke to Myra At Dr.Jue's office, patient needs a colonoscopy.Will check with Dr.Jordan next week about holding coumadin and call her back.

## 2012-02-15 NOTE — Telephone Encounter (Signed)
Sandy from [facility] [phone]  Please return call to [facility] of Dr. Caryl Never office regarding plavix hold for upcoming procedure

## 2012-02-20 NOTE — Telephone Encounter (Signed)
Sandy from Dr.Jue's office called was told spoke to Dr.Jordan he advised patient does not have to hold plavix before colonoscopy.

## 2012-03-04 ENCOUNTER — Encounter (HOSPITAL_COMMUNITY): Admission: RE | Payer: Self-pay | Source: Ambulatory Visit

## 2012-03-04 ENCOUNTER — Ambulatory Visit (HOSPITAL_COMMUNITY): Admission: RE | Admit: 2012-03-04 | Payer: Medicare Other | Source: Ambulatory Visit | Admitting: Urology

## 2012-03-04 SURGERY — CYSTOSCOPY, WITH BLADDER HYDRODISTENSION
Anesthesia: General

## 2012-03-05 ENCOUNTER — Other Ambulatory Visit: Payer: Self-pay | Admitting: Urology

## 2012-03-05 DIAGNOSIS — J9819 Other pulmonary collapse: Secondary | ICD-10-CM | POA: Diagnosis not present

## 2012-03-13 ENCOUNTER — Encounter (HOSPITAL_COMMUNITY): Payer: Self-pay | Admitting: Pharmacy Technician

## 2012-03-17 ENCOUNTER — Encounter (HOSPITAL_COMMUNITY): Payer: Self-pay

## 2012-03-17 ENCOUNTER — Encounter (HOSPITAL_COMMUNITY)
Admission: RE | Admit: 2012-03-17 | Discharge: 2012-03-17 | Disposition: A | Discharge status: Home / Self Care | Payer: Medicare Other | Source: Ambulatory Visit | Attending: Urology | Admitting: Urology

## 2012-03-17 ENCOUNTER — Other Ambulatory Visit: Payer: Self-pay | Admitting: Urology

## 2012-03-17 DIAGNOSIS — Z01812 Encounter for preprocedural laboratory examination: Secondary | ICD-10-CM | POA: Diagnosis not present

## 2012-03-17 DIAGNOSIS — N3010 Interstitial cystitis (chronic) without hematuria: Secondary | ICD-10-CM | POA: Diagnosis not present

## 2012-03-17 DIAGNOSIS — E7800 Pure hypercholesterolemia, unspecified: Secondary | ICD-10-CM | POA: Diagnosis not present

## 2012-03-17 DIAGNOSIS — Z7902 Long term (current) use of antithrombotics/antiplatelets: Secondary | ICD-10-CM | POA: Diagnosis not present

## 2012-03-17 DIAGNOSIS — Z01810 Encounter for preprocedural cardiovascular examination: Secondary | ICD-10-CM | POA: Diagnosis not present

## 2012-03-17 DIAGNOSIS — I10 Essential (primary) hypertension: Secondary | ICD-10-CM | POA: Diagnosis not present

## 2012-03-17 DIAGNOSIS — E039 Hypothyroidism, unspecified: Secondary | ICD-10-CM | POA: Diagnosis not present

## 2012-03-17 DIAGNOSIS — Z79899 Other long term (current) drug therapy: Secondary | ICD-10-CM | POA: Diagnosis not present

## 2012-03-17 LAB — BASIC METABOLIC PANEL
BUN: 6 mg/dL (ref 6–23)
CO2: 24 mEq/L (ref 19–32)
Chloride: 105 mEq/L (ref 96–112)
GFR calc non Af Amer: >90 mL/min (ref >90)
Glucose, Bld: 89 mg/dL (ref 70–99)
Potassium: 4.8 mEq/L (ref 3.5–5.1)
Sodium: 139 mEq/L (ref 135–145)

## 2012-03-17 LAB — CBC
HCT: 43.2 % (ref 36.0–46.0)
Hemoglobin: 15 g/dL (ref 12.0–15.0)
RBC: 4.75 MIL/uL (ref 3.87–5.11)
WBC: 9.4 10*3/uL (ref 4.0–10.5)

## 2012-03-17 LAB — SURGICAL PCR SCREEN: Staphylococcus aureus: NEGATIVE

## 2012-03-17 NOTE — Pre-Procedure Instructions (Signed)
Left message with Jolyn Lent, schehuler  for [facility] requesting ordeer for pyridium and marcaine to be entered in [facility] before surgery day

## 2012-03-17 NOTE — Pre-Procedure Instructions (Signed)
For OV Dr [place] 03/18/12- Chest 4/13 EPIC, PFT 3/13. Per note Dr [place]- NOT TO STOP PLAVIX-  Per note EPIC that Pam, schedular [facility] has been notified of this. Instructed patient to BE SURE SHE TAKES HER METOPROLOL THE EVENING BEFORE SURGERY AS SCHEDULED

## 2012-03-17 NOTE — Patient Instructions (Addendum)
20 [name]  03/17/2012   Your procedure is scheduled on:  03/28/12   Surgery  [id number]   Friday   Report to [facility] at   0515    AM.  Call this number if you have problems the morning of surgery: [phone]     Or PST   [phone]  [facility]   Remember:   Do not eat food: or drink any fluids  After Midnight. Thursday NIGHT      Take these medicines the morning of surgery with A SIP OF WATER:    PROTONIX                                        NITROGLYCERIN, APRAZOLAM,  NORCO, PHENERGRAN  IF NEEDED   Do not wear jewelry, make-up or nail polish.  Do not wear lotions, powders, or perfumes. You may wear deodorant.  Do not shave 48 hours prior to surgery.  Do not bring valuables to the hospital.  Contacts, dentures or bridgework may not be worn into surgery.  Leave suitcase in the car. After surgery it may be brought to your room.  For patients admitted to the hospital, checkout time is 11:00 AM the day of discharge.   Patients discharged the day of surgery will not be allowed to drive home.  Name and phone number of your driver:[name] [phone]                                                                      Special Instructions: CHG Shower Use Special Wash:    SEE ADDITIONAL INSTRUCTION  SHEET   REGULAR SOAP FACE AND PRIVATES              LADIES- NO SHAVING 48 HOURS BEFORE USING BETASEPT SOAP.                   Please read over the following fact sheets that you were given: MRSA Information

## 2012-03-18 ENCOUNTER — Encounter: Payer: Self-pay | Admitting: Cardiology

## 2012-03-18 ENCOUNTER — Ambulatory Visit (INDEPENDENT_AMBULATORY_CARE_PROVIDER_SITE_OTHER): Payer: Medicare Other | Admitting: Cardiology

## 2012-03-18 VITALS — BP 130/70 | HR 67 | Resp 18 | Ht 60.0 in | Wt 150.8 lb

## 2012-03-18 DIAGNOSIS — I2510 Atherosclerotic heart disease of native coronary artery without angina pectoris: Secondary | ICD-10-CM | POA: Diagnosis not present

## 2012-03-18 DIAGNOSIS — F17200 Nicotine dependence, unspecified, uncomplicated: Secondary | ICD-10-CM

## 2012-03-18 DIAGNOSIS — E785 Hyperlipidemia, unspecified: Secondary | ICD-10-CM | POA: Diagnosis not present

## 2012-03-18 DIAGNOSIS — I10 Essential (primary) hypertension: Secondary | ICD-10-CM

## 2012-03-18 MED ORDER — BUPIVACAINE HCL (PF) 0.5 % IJ SOLN: Freq: Once | INTRAMUSCULAR | Status: DC

## 2012-03-18 NOTE — Patient Instructions (Signed)
Continue your current medication  I will see you again in 4 months.   

## 2012-03-18 NOTE — Progress Notes (Signed)
[name] Date of Birth: 11/10/1955 Medical Record #[id number]  History of Present Illness: Ellsie is seen today for a followup visit. She had remote coronary bypass surgery in 2002. She had prior stenting of the vein graft to the obtuse marginal vessel in 2009. She then had a drug-eluting stent placement to this same saphenous vein graft to the ramus in March of 2012. This subsequently occluded and she underwent angioplasty and drug-eluting stent to the ramus intermediate branch in September of 2012. Cardiac catheterization again in October of 2012 showed nonobstructive disease. Since her last visit she has done quite well from a cardiac standpoint. She reports only 3 days where she had chest pain for which she took nitroglycerin. She continues to smoke but states she's trying to cut down. She does express that she has had more energy since her last coronary intervention.  Current Outpatient Prescriptions on File Prior to Visit  Medication Sig Dispense Refill  . ALPRAZolam (XANAX) 1 MG tablet Take 1 mg by mouth 4 (four) times daily as needed. For anxiety      . amLODipine (NORVASC) 5 MG tablet Take 5 mg by mouth every evening.      . clopidogrel (PLAVIX) 75 MG tablet Take 75 mg by mouth every evening.      . dicyclomine (BENTYL) 10 MG capsule Take 10 mg by mouth 4 (four) times daily -  before meals and at bedtime.      . diphenhydrAMINE (BENADRYL) 25 mg capsule Take 25 mg by mouth every 6 (six) hours as needed. Allergies      . HYDROcodone-acetaminophen (NORCO) 5-325 MG per tablet Take 1 tablet by mouth every 6 (six) hours as needed. For pain      . levothyroxine (SYNTHROID, LEVOTHROID) 25 MCG tablet Take 25 mcg by mouth every evening.       . metoprolol tartrate (LOPRESSOR) 25 MG tablet Take 50 mg by mouth every evening.      . nitroGLYCERIN (NITROSTAT) 0.4 MG SL tablet Place 1 tablet (0.4 mg total) under the tongue every 5 (five) minutes as needed (up to 3 doses). For chest pain  25 tablet  4   . pantoprazole (PROTONIX) 40 MG tablet Take 1 tablet (40 mg total) by mouth 2 (two) times daily.  180 tablet  1  . phenazopyridine (PYRIDIUM) 200 MG tablet Take 200 mg by mouth 3 (three) times daily as needed. Bladder pain      . promethazine (PHENERGAN) 25 MG tablet Take 25 mg by mouth every 6 (six) hours as needed. For nausea      . rosuvastatin (CRESTOR) 20 MG tablet Take 20 mg by mouth every evening.      . zolpidem (AMBIEN) 10 MG tablet Take 10 mg by mouth at bedtime as needed. Sleep      . ciprofloxacin (CIPRO) 500 MG tablet Take 500 mg by mouth 2 (two) times daily.       Current Facility-Administered Medications on File Prior to Visit  Medication Dose Route Frequency Provider Last Rate Last Dose  . bupivacaine (MARCAINE) 0.5 % 15 mL, phenazopyridine (PYRIDIUM) 400 mg bladder mixture   Bladder Instillation Once Martina Sinner, MD        Allergies  Allergen Reactions  . Morphine And Related Other (See Comments)    Flushing & "makes me feel weird; that sets off a panic attack" Pain meds lowers blood pressure  . Ranitidine Hives  . Sulfa Antibiotics     Past Medical History  Diagnosis Date  . Chest pain, cardiac     Has chronic chest pain.   . Coronary artery disease     a) s/p CABG x 2 in [place] 2002. b)stenting to the SVG-OM in 2009. c)DES to the saphenous vein graft to the ramus in March 2012. d) s/p PTCA/DES to native ramus intermedius in September 2012. Last cath 03/2011 with minimal nonobstructive disease  e) indeterminant myoview 09/2011 but atypical CP & no perfusion defect in ramus intermediate distribution, EF 52%  . Hypertension   . Hyperlipidemia   . Tobacco abuse   . Hypothyroidism   . Hx: UTI (urinary tract infection)     multiple  . Enterocolitis   . Gastroparesis   . Complication of anesthesia     "my blood pressure will drop out on me"  . 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 Anemia   . Blood transfusion   . GERD  (gastroesophageal reflux disease)   . Hematuria 09/25/11    "lately"  . GI bleeding   . Anxiety   . Depression   . PTSD (post-traumatic stress disorder)   . Migraines     h/o    Past Surgical History  Procedure Date  . Vesicovaginal fistula closure w/ tah 1983  . Tmj arthroplasty 1980's    right side; "I've had to have it twice"  . Coronary artery bypass graft 2002    CABG X2  . Coronary angioplasty with stent placement     "lots; they've all collapsed; last time dr took native vein &  turned it around & put stents in it before attaching"  . Abdominal hysterectomy 1980's  . Salpingoophorectomy 1980's    "2 surgeries; 1st right; then left"  . Biopsy stomach 09/25/11    "several in the last year; all negative; via endoscopy"  . Pilonidal cyst / sinus excision 1980's    "was wrapped around my spinal cord; Dr. Yvette Rack got almost all of it except a tiny bit; said it was really deep; packed fatty tissue in the hole; really bothers me alot"  . Cholecystectomy 2000's  . Breast surgery     right biopsy    History  Smoking status  . Current Every Day Smoker -- 0.5 packs/day for 15 years  . Types: Cigarettes  Smokeless tobacco  . Never Used    History  Alcohol Use No    Family History  Problem Relation Age of Onset  . Heart disease Father   . Emphysema Mother     smoked  . Asthma Mother   . Heart disease Paternal Grandfather   . Breast cancer Mother     Review of Systems: The review of systems is  positive for chronic pain. She has had recurrent hematuria. She is scheduled for retrograde cystogram. She is also scheduled with gastroenterology for panendoscopy. She does have a history of gastroparesis.  All other systems were reviewed and are negative.  Physical Exam: BP 130/70  Pulse 67  Resp 18  Ht 5' (1.524 m)  Wt 150 lb 12.8 oz (68.402 kg)  BMI 29.45 kg/m2  SpO2 94% Patient is alert and in no acute distress. Skin is warm and dry. Color is normal.  HEENT is  unremarkable. Normocephalic/atraumatic. PERRL. Sclera are nonicteric. Neck is supple. No masses. No JVD. Lungs are coarse. Cardiac exam shows a regular rate and rhythm. Abdomen is soft. Extremities are without edema. Gait and ROM are intact. No  gross neurologic deficits noted.  LABORATORY DATA:     Assessment / Plan: 1. Coronary disease with previous bypass and multiple interventions as noted above. Symptomatically she is doing quite well. Because of her hematuria we had stopped her aspirin. I would recommend that she remain on Plavix indefinitely. I will followup again in 6 months.  2. Hypertension, controlled.  3. Tobacco abuse. I've counseled her on the need for smoking cessation.  4. Hyperlipidemia.

## 2012-03-18 NOTE — Pre-Procedure Instructions (Signed)
OV Dr [place] today- EPIC

## 2012-03-28 ENCOUNTER — Encounter (HOSPITAL_COMMUNITY): Payer: Self-pay | Admitting: Certified Registered Nurse Anesthetist

## 2012-03-28 ENCOUNTER — Encounter (HOSPITAL_COMMUNITY)
Admission: RE | Disposition: A | Discharge status: Home / Self Care | Payer: Self-pay | Source: Ambulatory Visit | Attending: Urology

## 2012-03-28 ENCOUNTER — Ambulatory Visit (HOSPITAL_COMMUNITY): Payer: Medicare Other | Admitting: Certified Registered Nurse Anesthetist

## 2012-03-28 ENCOUNTER — Encounter (HOSPITAL_COMMUNITY): Payer: Self-pay | Admitting: *Deleted

## 2012-03-28 ENCOUNTER — Ambulatory Visit (HOSPITAL_COMMUNITY)
Admission: RE | Admit: 2012-03-28 | Discharge: 2012-03-28 | Disposition: A | Discharge status: Home / Self Care | Payer: Medicare Other | Source: Ambulatory Visit | Attending: Urology | Admitting: Urology

## 2012-03-28 DIAGNOSIS — Z01810 Encounter for preprocedural cardiovascular examination: Secondary | ICD-10-CM | POA: Diagnosis not present

## 2012-03-28 DIAGNOSIS — N949 Unspecified condition associated with female genital organs and menstrual cycle: Secondary | ICD-10-CM | POA: Diagnosis not present

## 2012-03-28 DIAGNOSIS — K219 Gastro-esophageal reflux disease without esophagitis: Secondary | ICD-10-CM | POA: Diagnosis not present

## 2012-03-28 DIAGNOSIS — Z01812 Encounter for preprocedural laboratory examination: Secondary | ICD-10-CM | POA: Diagnosis not present

## 2012-03-28 DIAGNOSIS — Z7902 Long term (current) use of antithrombotics/antiplatelets: Secondary | ICD-10-CM | POA: Diagnosis not present

## 2012-03-28 DIAGNOSIS — N3010 Interstitial cystitis (chronic) without hematuria: Secondary | ICD-10-CM | POA: Diagnosis not present

## 2012-03-28 DIAGNOSIS — Z79899 Other long term (current) drug therapy: Secondary | ICD-10-CM | POA: Diagnosis not present

## 2012-03-28 DIAGNOSIS — E039 Hypothyroidism, unspecified: Secondary | ICD-10-CM | POA: Insufficient documentation

## 2012-03-28 DIAGNOSIS — I10 Essential (primary) hypertension: Secondary | ICD-10-CM | POA: Insufficient documentation

## 2012-03-28 DIAGNOSIS — E7800 Pure hypercholesterolemia, unspecified: Secondary | ICD-10-CM | POA: Insufficient documentation

## 2012-03-28 DIAGNOSIS — R319 Hematuria, unspecified: Secondary | ICD-10-CM | POA: Diagnosis not present

## 2012-03-28 HISTORY — PX: CYSTOSCOPY W/ RETROGRADES: SHX1426

## 2012-03-28 HISTORY — PX: CYSTOSCOPY WITH INJECTION: SHX1424

## 2012-03-28 HISTORY — PX: CYSTO WITH HYDRODISTENSION: SHX5453

## 2012-03-28 SURGERY — CYSTOSCOPY, WITH BLADDER HYDRODISTENSION
Anesthesia: General | Wound class: Clean Contaminated

## 2012-03-28 MED ORDER — CIPROFLOXACIN HCL 250 MG PO TABS: 250.0000 mg | ORAL_TABLET | Freq: Two times a day (BID) | ORAL | Status: DC

## 2012-03-28 MED ORDER — CIPROFLOXACIN IN D5W 400 MG/200ML IV SOLN
INTRAVENOUS | Status: AC
  Filled 2012-03-28: qty 200

## 2012-03-28 MED ORDER — LIDOCAINE HCL (CARDIAC) 20 MG/ML IV SOLN
INTRAVENOUS | Status: DC | PRN
  Administered 2012-03-28: 50 mg via INTRAVENOUS

## 2012-03-28 MED ORDER — FENTANYL CITRATE 0.05 MG/ML IJ SOLN: 25.0000 ug | INTRAMUSCULAR | Status: DC | PRN

## 2012-03-28 MED ORDER — HYDROCODONE-ACETAMINOPHEN 5-500 MG PO TABS: 1.0000 | ORAL_TABLET | Freq: Four times a day (QID) | ORAL | Status: DC | PRN

## 2012-03-28 MED ORDER — PROMETHAZINE HCL 25 MG/ML IJ SOLN: 6.2500 mg | INTRAMUSCULAR | Status: DC | PRN

## 2012-03-28 MED ORDER — ONDANSETRON HCL 4 MG/2ML IJ SOLN
INTRAMUSCULAR | Status: AC
  Filled 2012-03-28: qty 2

## 2012-03-28 MED ORDER — IOHEXOL 300 MG/ML  SOLN
INTRAMUSCULAR | Status: DC | PRN
  Administered 2012-03-28: 16 mL

## 2012-03-28 MED ORDER — MIDAZOLAM HCL 5 MG/5ML IJ SOLN
INTRAMUSCULAR | Status: DC | PRN
  Administered 2012-03-28: 2 mg via INTRAVENOUS

## 2012-03-28 MED ORDER — IOHEXOL 300 MG/ML  SOLN
INTRAMUSCULAR | Status: AC
  Filled 2012-03-28: qty 1

## 2012-03-28 MED ORDER — FENTANYL CITRATE 0.05 MG/ML IJ SOLN
INTRAMUSCULAR | Status: DC | PRN
  Administered 2012-03-28 (×3): 50 ug via INTRAVENOUS
  Administered 2012-03-28: 100 ug via INTRAVENOUS

## 2012-03-28 MED ORDER — ACETAMINOPHEN 10 MG/ML IV SOLN
INTRAVENOUS | Status: AC
  Filled 2012-03-28: qty 100

## 2012-03-28 MED ORDER — CIPROFLOXACIN IN D5W 400 MG/200ML IV SOLN
400.0000 mg | INTRAVENOUS | Status: AC
  Administered 2012-03-28: 400 mg via INTRAVENOUS

## 2012-03-28 MED ORDER — PHENAZOPYRIDINE HCL 200 MG PO TABS
ORAL | Status: DC | PRN
  Administered 2012-03-28: 08:00:00 via INTRAVESICAL

## 2012-03-28 MED ORDER — ACETAMINOPHEN 10 MG/ML IV SOLN
INTRAVENOUS | Status: DC | PRN
  Administered 2012-03-28: 1000 mg via INTRAVENOUS

## 2012-03-28 MED ORDER — EPHEDRINE SULFATE 50 MG/ML IJ SOLN
INTRAMUSCULAR | Status: DC | PRN
  Administered 2012-03-28: 5 mg via INTRAVENOUS

## 2012-03-28 MED ORDER — LACTATED RINGERS IV SOLN: INTRAVENOUS | Status: DC | PRN

## 2012-03-28 MED ORDER — HYDROCODONE-ACETAMINOPHEN 5-325 MG PO TABS
1.0000 | ORAL_TABLET | ORAL | Status: DC | PRN
  Administered 2012-03-28: 1 via ORAL
  Filled 2012-03-28: qty 1

## 2012-03-28 MED ORDER — LACTATED RINGERS IV SOLN
INTRAVENOUS | Status: DC | PRN
  Administered 2012-03-28: 07:00:00 via INTRAVENOUS

## 2012-03-28 MED ORDER — PROPOFOL 10 MG/ML IV BOLUS
INTRAVENOUS | Status: DC | PRN
  Administered 2012-03-28: 50 mg via INTRAVENOUS
  Administered 2012-03-28: 100 mg via INTRAVENOUS

## 2012-03-28 MED ORDER — ONDANSETRON HCL 4 MG/2ML IJ SOLN
INTRAMUSCULAR | Status: DC | PRN
  Administered 2012-03-28: 4 mg via INTRAVENOUS

## 2012-03-28 MED ORDER — PHENAZOPYRIDINE HCL 200 MG PO TABS
Freq: Once | ORAL | Status: DC
  Filled 2012-03-28: qty 15

## 2012-03-28 MED ORDER — STERILE WATER FOR IRRIGATION IR SOLN
Status: DC | PRN
  Administered 2012-03-28: 3000 mL

## 2012-03-28 SURGICAL SUPPLY — 11 items
ADPR CATH UNV TPR FL F LL (CATHETERS) ×1
BAG URO CATCHER STRL LF (DRAPE) ×3 IMPLANT
CATH ROBINSON RED A/P 16FR (CATHETERS) ×3 IMPLANT
CLOTH BEACON ORANGE TIMEOUT ST (SAFETY) ×3 IMPLANT
CONNECTOR CATH FOLEY FEMALE LL (CATHETERS) ×3 IMPLANT
DRAPE CAMERA CLOSED 9X96 (DRAPES) ×3 IMPLANT
GLOVE BIOGEL M STRL SZ7.5 (GLOVE) ×3 IMPLANT
GOWN STRL REIN XL XLG (GOWN DISPOSABLE) ×6 IMPLANT
PACK CYSTO (CUSTOM PROCEDURE TRAY) ×3 IMPLANT
TUBING CONNECTING 10 (TUBING) ×3 IMPLANT
WATER STERILE IRR 3000ML UROMA (IV SOLUTION) ×3 IMPLANT

## 2012-03-28 NOTE — Interval H&P Note (Signed)
History and Physical Interval Note:  03/28/2012 7:17 AM  [name]  has presented today for surgery, with the diagnosis of pelvic pain, hematuria  The various methods of treatment have been discussed with the patient and family. After consideration of risks, benefits and other options for treatment, the patient has consented to  Procedure(s) (LRB) with comments: CYSTOSCOPY/HYDRODISTENSION (N/A) CYSTOSCOPY WITH INJECTION (N/A) - Marcaine and Pyridium CYSTOSCOPY WITH RETROGRADE PYELOGRAM (Bilateral) as a surgical intervention .  The patient's history has been reviewed, patient examined, no change in status, stable for surgery.  I have reviewed the patient's chart and labs.  Questions were answered to the patient's satisfaction.     Rayford Williamsen A

## 2012-03-28 NOTE — Progress Notes (Signed)
Dr. Council Mechanic made aware of patient's blood pressures.

## 2012-03-28 NOTE — Transfer of Care (Signed)
Immediate Anesthesia Transfer of Care Note  Patient: [name]  Procedure(s) Performed: Procedure(s) (LRB) with comments: CYSTOSCOPY/HYDRODISTENSION (N/A) CYSTOSCOPY WITH INJECTION (N/A) - Marcaine and Pyridium CYSTOSCOPY WITH RETROGRADE PYELOGRAM (Bilateral)  Patient Location: PACU  Anesthesia Type: General  Level of Consciousness: awake, alert  and oriented  Airway & Oxygen Therapy: Patient Spontanous Breathing and Patient connected to face mask oxygen  Post-op Assessment: Report given to PACU RN  Post vital signs: Reviewed and stable  Complications: No apparent anesthesia complications

## 2012-03-28 NOTE — Progress Notes (Signed)
Doctor in to see patient after paged x2. Spoke with patient and family.

## 2012-03-28 NOTE — Anesthesia Procedure Notes (Signed)
Procedure Name: LMA Insertion Date/Time: 03/28/2012 7:41 AM Performed by: Hulan Fess Pre-anesthesia Checklist: Patient identified, Emergency Drugs available, Suction available and Timeout performed Patient Re-evaluated:Patient Re-evaluated prior to inductionOxygen Delivery Method: Circle system utilized Preoxygenation: Pre-oxygenation with 100% oxygen Intubation Type: IV induction Ventilation: Mask ventilation without difficulty LMA: LMA with gastric port inserted LMA Size: 3.0

## 2012-03-28 NOTE — Anesthesia Preprocedure Evaluation (Addendum)
Anesthesia Evaluation  Patient identified by MRN, date of birth, ID band Patient awake    Reviewed: Allergy & Precautions, H&P , NPO status , Patient's Chart, lab work & pertinent test results  Airway Mallampati: II TM Distance: >3 FB Neck ROM: Full    Dental No notable dental hx.    Pulmonary shortness of breath,  breath sounds clear to auscultation  Pulmonary exam normal       Cardiovascular hypertension, Pt. on medications and Pt. on home beta blockers + CAD Rhythm:Regular Rate:Normal  Dr. Elvis Coil note reviewed. No chest pain in 2 weeks. Stayed on Plavix.   Neuro/Psych  Headaches, PSYCHIATRIC DISORDERS Anxiety Depression    GI/Hepatic Neg liver ROS, GERD-  Medicated,  Endo/Other  Hypothyroidism   Renal/GU negative Renal ROS  negative genitourinary   Musculoskeletal negative musculoskeletal ROS (+)   Abdominal   Peds negative pediatric ROS (+)  Hematology negative hematology ROS (+)   Anesthesia Other Findings   Reproductive/Obstetrics negative OB ROS                          Anesthesia Physical Anesthesia Plan  ASA: III  Anesthesia Plan: General   Post-op Pain Management:    Induction: Intravenous  Airway Management Planned: LMA  Additional Equipment:   Intra-op Plan:   Post-operative Plan: Extubation in OR  Informed Consent: I have reviewed the patients History and Physical, chart, labs and discussed the procedure including the risks, benefits and alternatives for the proposed anesthesia with the patient or authorized representative who has indicated his/her understanding and acceptance.   Dental advisory given  Plan Discussed with: CRNA  Anesthesia Plan Comments:         Anesthesia Quick Evaluation

## 2012-03-28 NOTE — Anesthesia Postprocedure Evaluation (Signed)
  Anesthesia Post-op Note  Patient: [name]  Procedure(s) Performed: Procedure(s) (LRB): CYSTOSCOPY/HYDRODISTENSION (N/A) CYSTOSCOPY WITH INJECTION (N/A) CYSTOSCOPY WITH RETROGRADE PYELOGRAM (Bilateral)  Patient Location: PACU  Anesthesia Type: General  Level of Consciousness: awake and alert   Airway and Oxygen Therapy: Patient Spontanous Breathing  Post-op Pain: mild  Post-op Assessment: Post-op Vital signs reviewed, Patient's Cardiovascular Status Stable, Respiratory Function Stable, Patent Airway and No signs of Nausea or vomiting  Post-op Vital Signs: stable  Complications: No apparent anesthesia complications. No chest pain.

## 2012-03-28 NOTE — Op Note (Signed)
Preoperative diagnosis: Hematuria pelvic pain Postoperative diagnosis: Hematuria and interstitial cystitis Surgery: Cystoscopy bladder hydrodistention and bladder installation therapy and bilateral retrograde ureterogram Surgeon: Dr. Lorin Picket Elchanan Bob  The patient has the above diagnoses and consented to the above procedure  Extra care was taken with leg positioning. Preoperative antibiotics were given. 22 French cystoscope was utilized. Bladder mucosa and trigone were normal. There was no stitch or foreign body or carcinoma. A good look in the urethra as well. Ureteral orifices were normal  I initially did bilateral retrograde ureterograms.  Retrograde ureterogram: Left side was done first. Open-ended ureteral catheter was utilized. I injected approximately 6 cc gently after inserting the catheter 1-2 cm in the left ureteral orifice. She had a normal caliber ureter throughout its length. I then advanced the open-end ureteral catheter to the high left pelvic ureter and use approximate 3 or 4 more cc of contrast and she had a normal non-dilated left renal pelvis with no filling defects. The exact same was done on the right side with again normal findings throughout. Patient was in the AP lithotomy position  I then hydrodistended the patient's bladder to 600 mL. The bladder was emptied. I reexamined the bladder and she diffuse glomerulations in keeping with a diagnosis of interstitial cystitis. There were no bladder injuries  Bladder was emptied as a separate procedure I instilled 15 cc of 0.5% Marcaine was 400 mg of peridium

## 2012-03-28 NOTE — H&P (Signed)
History of Present Illness   Ms. Kussman is having right sided pain and leg pain again. She feels the pressure that she has to go all the time. She was given ciprofloxacin. She is on her Plavix but has stopped her aspirin. She has had some more blood in the urine.   She had a CT Scan in April which was basically normal. The distal half of the right ureter was not completely visualized. There is no soft tissue prominence. Urine culture in March was normal. She was pain free when I saw her in April. I was considering doing a hydrodistension. Review of systems: No change in bowel or neurologic status.   There is no other modifying factors or associated signs or symptoms. There is no other aggravating or relieving factors. The symptoms are getting worse over time and moderately severe.      Past Medical History Problems  1. History of  Cardiac Arrest 427.5 2. History of  Depression With Anxiety 300.4 3. History of  Heart Disease 429.9 4. History of  Heartburn 787.1 5. History of  Hypercholesterolemia 272.0 6. History of  Hypertension 401.9 7. History of  Hypothyroidism 244.9  Surgical History Problems  1. History of  Bladder Surgery 2. History of  High Gastric Bypass 3. History of  Hysterectomy V45.77 4. History of  Open Treatment Of A Fracture Of The Coccyx 5. History of  TMJ Discectomy  Current Meds 1. AmLODIPine Besylate 5 MG Oral Tablet; Therapy: 24Jun2013 to 2. Clopidogrel Bisulfate 75 MG Oral Tablet; Therapy: (Recorded:28Mar2013) to 3. Crestor 20 MG Oral Tablet; Therapy: (Recorded:28Mar2013) to 4. Hydrocodone-Acetaminophen TABS; Therapy: (Recorded:28Mar2013) to 5. Levothyroxine Sodium TABS; Therapy: (Recorded:28Mar2013) to 6. Metoprolol Tartrate 25 MG Oral Tablet; Therapy: 20Feb2013 to 7. Nitroglycerin 0.4 MG SUBL; Therapy: (Recorded:28Mar2013) to 8. Norco 5-325 MG Oral Tablet; Therapy: (Recorded:28Mar2013) to 9. Pantoprazole Sodium 40 MG Oral Tablet Delayed Release; Therapy:  (Recorded:28Mar2013) to 10. Phenazopyridine Plus TABS; Therapy: (Recorded:28Mar2013) to 11. Promethazine HCl 25 MG Oral Tablet; Therapy: (Recorded:28Mar2013) to 12. Xanax 1 MG Oral Tablet; Therapy: (Recorded:28Mar2013) to 13. Zolpidem Tartrate 10 MG Oral Tablet; Therapy: (Recorded:28Mar2013) to  Allergies Medication  1. Tagamet TABS 2. Morphine Sulfate TABS  Family History Problems  1. Maternal history of  Chronic Obstructive Pulmonary Disease 2. Maternal aunt's history of  Heart Disease V17.49 3. Maternal uncle's history of  Heart Disease V17.49 4. Paternal history of  Heart Disease V17.49 5. Maternal grandfather's history of  Lung Cancer V16.1 6. Fraternal history of  Prostate Cancer V16.42  Social History Problems  1. Caffeine Use 2. Marital History - Widowed 3. Occupation: disabled 4. Tobacco Use 305.1 Denied  5. History of  Alcohol Use  Vitals Vital Signs [Data Includes: Last 1 Day]  23Jul2013 02:42PM  Blood Pressure: 137 / 90 Temperature: 98.3 F Heart Rate: 84  Results/Data  Selected Results  UA With REFLEX 23Jul2013 02:30PM Elara Cocke   Test Name Result Flag Reference  COLOR YELLOW  YELLOW  APPEARANCE CLEAR  CLEAR  SPECIFIC GRAVITY <1.005 L 1.005-1.030  pH 6.0  5.0-8.0  GLUCOSE NEG mg/dL  NEG  BILIRUBIN NEG  NEG  KETONE NEG mg/dL  NEG  BLOOD MOD A NEG  PROTEIN NEG mg/dL  NEG  UROBILINOGEN 0.2 mg/dL  5.2-8.4  NITRITE NEG  NEG  LEUKOCYTE ESTERASE SMALL A NEG  SQUAMOUS EPITHELIAL/HPF RARE  RARE  WBC 21-50 WBC/hpf A <3  RBC 3-6 RBC/hpf A <3  BACTERIA FEW A RARE  CRYSTALS NONE SEEN  NONE SEEN  CASTS NONE SEEN  NONE SEEN   Assessment Assessed  1. Abdominal Pain 789.00 2. Gross Hematuria 599.71  Plan   Discussion/Summary   Ms. Odonnel has complicated voiding dysfunction and pain syndrome. She consented to a hydrodistension.  We talked about cystoscopy/hydrodistension and instillation in detail. Pros, cons, general surgical and anesthetic  risks, and other options including watchful waiting were discussed. Risks were described but not limited to pain, infection, and bleeding. The risk of bladder perforation and management were discussed. The patient understands that it is primarily a diagnostic procedure.   I will do bilateral retrogrades at the same time. We will proceed accordingly. I will call her if the culture is positive.  After a thorough review of the management options for the patient's condition the patient  elected to proceed with surgical therapy as noted above. We have discussed the potential benefits and risks of the procedure, side effects of the proposed treatment, the likelihood of the patient achieving the goals of the procedure, and any potential problems that might occur during the procedure or recuperation. Informed consent has been obtained.

## 2012-03-31 ENCOUNTER — Encounter (HOSPITAL_COMMUNITY): Payer: Self-pay | Admitting: Urology

## 2012-04-04 DIAGNOSIS — R350 Frequency of micturition: Secondary | ICD-10-CM | POA: Diagnosis not present

## 2012-04-04 DIAGNOSIS — N3010 Interstitial cystitis (chronic) without hematuria: Secondary | ICD-10-CM | POA: Diagnosis not present

## 2012-04-07 DIAGNOSIS — N3010 Interstitial cystitis (chronic) without hematuria: Secondary | ICD-10-CM | POA: Diagnosis not present

## 2012-04-07 DIAGNOSIS — R350 Frequency of micturition: Secondary | ICD-10-CM | POA: Diagnosis not present

## 2012-04-09 DIAGNOSIS — N390 Urinary tract infection, site not specified: Secondary | ICD-10-CM | POA: Diagnosis not present

## 2012-04-09 DIAGNOSIS — G8929 Other chronic pain: Secondary | ICD-10-CM | POA: Diagnosis not present

## 2012-04-09 DIAGNOSIS — N3010 Interstitial cystitis (chronic) without hematuria: Secondary | ICD-10-CM | POA: Diagnosis not present

## 2012-06-04 ENCOUNTER — Encounter: Payer: Self-pay | Admitting: Cardiology

## 2012-06-04 ENCOUNTER — Ambulatory Visit (INDEPENDENT_AMBULATORY_CARE_PROVIDER_SITE_OTHER): Payer: Medicare Other | Admitting: Cardiology

## 2012-06-04 VITALS — BP 147/91 | HR 73 | Ht 60.0 in | Wt 145.4 lb

## 2012-06-04 DIAGNOSIS — I2510 Atherosclerotic heart disease of native coronary artery without angina pectoris: Secondary | ICD-10-CM

## 2012-06-04 DIAGNOSIS — R0609 Other forms of dyspnea: Secondary | ICD-10-CM | POA: Diagnosis not present

## 2012-06-04 DIAGNOSIS — R0600 Dyspnea, unspecified: Secondary | ICD-10-CM

## 2012-06-04 DIAGNOSIS — E039 Hypothyroidism, unspecified: Secondary | ICD-10-CM | POA: Diagnosis not present

## 2012-06-04 DIAGNOSIS — I10 Essential (primary) hypertension: Secondary | ICD-10-CM

## 2012-06-04 DIAGNOSIS — R0989 Other specified symptoms and signs involving the circulatory and respiratory systems: Secondary | ICD-10-CM

## 2012-06-04 MED ORDER — CLOPIDOGREL BISULFATE 75 MG PO TABS: 75.0000 mg | ORAL_TABLET | Freq: Every evening | ORAL | Status: DC

## 2012-06-04 MED ORDER — ROSUVASTATIN CALCIUM 20 MG PO TABS: 20.0000 mg | ORAL_TABLET | Freq: Every evening | ORAL | Status: DC

## 2012-06-04 MED ORDER — NITROGLYCERIN 0.4 MG SL SUBL: 0.4000 mg | SUBLINGUAL_TABLET | SUBLINGUAL | Status: DC | PRN

## 2012-06-04 MED ORDER — AMLODIPINE BESYLATE 5 MG PO TABS: 5.0000 mg | ORAL_TABLET | Freq: Every evening | ORAL | Status: DC

## 2012-06-04 MED ORDER — PANTOPRAZOLE SODIUM 40 MG PO TBEC: 80.0000 mg | DELAYED_RELEASE_TABLET | Freq: Every day | ORAL | Status: DC

## 2012-06-04 MED ORDER — LEVOTHYROXINE SODIUM 25 MCG PO TABS: 25.0000 ug | ORAL_TABLET | Freq: Every evening | ORAL | Status: DC

## 2012-06-04 MED ORDER — METOPROLOL TARTRATE 25 MG PO TABS: 50.0000 mg | ORAL_TABLET | Freq: Every evening | ORAL | Status: DC

## 2012-06-04 NOTE — Patient Instructions (Addendum)
Continue your current medication  I will see you in 4 months  You have been referred to [facility] at [facility] to get established with a primary care physician.

## 2012-06-04 NOTE — Progress Notes (Signed)
[name] Date of Birth: 1956/01/21 Medical Record #[id number]  History of Present Illness: [name] is seen today for a followup visit. [name] had remote coronary bypass surgery in 2002. [name] had prior stenting of the vein graft to the obtuse marginal vessel in 2009. [name] then had a drug-eluting stent placement to this same saphenous vein graft to the ramus in March of 2012. This subsequently occluded and [name] underwent angioplasty and drug-eluting stent to the ramus intermediate branch in September of 2012. Cardiac catheterization again in October of 2012 showed nonobstructive disease. Since her last visit [name] has done quite well from a cardiac standpoint. [name] has had very little chest pain. [name] did have 2 episodes this week were lead with sublingual nitroglycerin. [name] is looking for a new primary care doctor. [name] apparently saw a gastroenterologist at [facility] who recommended repeat endoscopy but [name] did not want this done because of concern about sedation.  Current Outpatient Prescriptions on File Prior to Visit  Medication Sig Dispense Refill  . diphenhydrAMINE (BENADRYL) 25 mg capsule Take 75 mg by mouth every 6 (six) hours as needed. Allergies      . polyethylene glycol powder (GLYCOLAX/MIRALAX) powder       . promethazine (PHENERGAN) 25 MG tablet Take 25 mg by mouth every 6 (six) hours as needed. For nausea      . triamcinolone ointment (KENALOG) 0.5 %       . zolpidem (AMBIEN) 10 MG tablet Take 10 mg by mouth at bedtime as needed. Sleep      . [DISCONTINUED] levothyroxine (SYNTHROID, LEVOTHROID) 25 MCG tablet Take 25 mcg by mouth every evening.       . [DISCONTINUED] nitroGLYCERIN (NITROSTAT) 0.4 MG SL tablet Place 1 tablet (0.4 mg total) under the tongue every 5 (five) minutes as needed (up to 3 doses). For chest pain  25 tablet  4  . [DISCONTINUED] pantoprazole (PROTONIX) 40 MG tablet Take 1 tablet (40 mg total) by mouth 2 (two) times daily.  180 tablet  1   Current Facility-Administered  Medications on File Prior to Visit  Medication Dose Route Frequency Provider Last Rate Last Dose  . bupivacaine (MARCAINE) 0.5 % 15 mL, phenazopyridine (PYRIDIUM) 400 mg bladder mixture   Bladder Instillation Once Martina Sinner, MD        Allergies  Allergen Reactions  . Morphine And Related Other (See Comments)    Flushing & "makes me feel weird; that sets off a panic attack" Pain meds lowers blood pressure  . Ranitidine Hives  . Sulfa Antibiotics     Past Medical History  Diagnosis Date  . Chest pain, cardiac     Has chronic chest pain.   . Coronary artery disease     a) s/p CABG x 2 in [place] 2002. b)stenting to the SVG-OM in 2009. c)DES to the saphenous vein graft to the ramus in March 2012. d) s/p PTCA/DES to native ramus intermedius in September 2012. Last cath 03/2011 with minimal nonobstructive disease  e) indeterminant myoview 09/2011 but atypical CP & no perfusion defect in ramus intermediate distribution, EF 52%  . Hypertension   . Hyperlipidemia   . Tobacco abuse   . Hypothyroidism   . Hx: UTI (urinary tract infection)     multiple  . Enterocolitis   . Gastroparesis   . Complication of anesthesia     "my blood pressure will drop out on me"  . Embolism - blood clot 2002    "in my heart  after bypass"  . Anemia   . Blood transfusion   . GERD (gastroesophageal reflux disease)   . Hematuria 09/25/11    "lately"  . GI bleeding   . Anxiety   . Depression   . PTSD (post-traumatic stress disorder)   . Migraines     h/o    Past Surgical History  Procedure Date  . Vesicovaginal fistula closure w/ tah 1983  . Tmj arthroplasty 1980's    right side; "I've had to have it twice"  . Coronary artery bypass graft 2002    CABG X2  . Coronary angioplasty with stent placement     "lots; they've all collapsed; last time dr took native vein &  turned it around & put stents in it before attaching"  . Abdominal hysterectomy 1980's  . Salpingoophorectomy 1980's    "2  surgeries; 1st right; then left"  . Biopsy stomach 09/25/11    "several in the last year; all negative; via endoscopy"  . Pilonidal cyst / sinus excision 1980's    "was wrapped around my spinal cord; Dr. Yvette Rack got almost all of it except a tiny bit; said it was really deep; packed fatty tissue in the hole; really bothers me alot"  . Cholecystectomy 2000's  . Breast surgery     right biopsy  . Cysto with hydrodistension 03/28/2012    Procedure: CYSTOSCOPY/HYDRODISTENSION;  Surgeon: Martina Sinner, MD;  Location: [facility];  Service: Urology;  Laterality: N/A;  . Cystoscopy with injection 03/28/2012    Procedure: CYSTOSCOPY WITH INJECTION;  Surgeon: Martina Sinner, MD;  Location: [facility];  Service: Urology;  Laterality: N/A;  Marcaine and Pyridium  . Cystoscopy w/ retrogrades 03/28/2012    Procedure: CYSTOSCOPY WITH RETROGRADE PYELOGRAM;  Surgeon: Martina Sinner, MD;  Location: [facility];  Service: Urology;  Laterality: Bilateral;    History  Smoking status  . Current Every Day Smoker -- 0.5 packs/day for 15 years  . Types: Cigarettes  Smokeless tobacco  . Never Used    History  Alcohol Use No    Family History  Problem Relation Age of Onset  . Heart disease Father   . Emphysema Mother     smoked  . Asthma Mother   . Heart disease Paternal Grandfather   . Breast cancer Mother     Review of Systems: The review of systems is  positive for chronic pain. [name] also has a history of anxiety and depression. [name] is being evaluated by urology for hematuria.  All other systems were reviewed and are negative.  Physical Exam: BP 147/91  Pulse 73  Ht 5' (1.524 m)  Wt 145 lb 6.4 oz (65.953 kg)  BMI 28.40 kg/m2 Patient is alert and in no acute distress. Skin is warm and dry. Color is normal.  HEENT is unremarkable. Normocephalic/atraumatic. PERRL. Sclera are nonicteric. Neck is supple. No masses. No JVD. Lungs are coarse. Cardiac exam shows a regular rate and rhythm. Abdomen is  soft. Extremities are without edema. Gait and ROM are intact. No gross neurologic deficits noted.  LABORATORY DATA:   ECG today demonstrates normal sinus rhythm with a normal ECG.  Assessment / Plan: 1. Coronary disease with previous bypass and multiple interventions as noted above. Symptomatically [name] is doing quite well. Because of her hematuria we had stopped her aspirin. I would recommend that [name] remain on Plavix indefinitely. I will followup again in 6 months.  2. Hypertension, controlled.  3. Tobacco abuse. I've counseled her on  the need for smoking cessation.  4. Hyperlipidemia.  5. Multiple medical problems. We will try to facilitate establishment with primary care.

## 2012-06-11 DIAGNOSIS — N3010 Interstitial cystitis (chronic) without hematuria: Secondary | ICD-10-CM | POA: Diagnosis not present

## 2012-06-11 DIAGNOSIS — R110 Nausea: Secondary | ICD-10-CM | POA: Diagnosis not present

## 2012-06-11 DIAGNOSIS — R109 Unspecified abdominal pain: Secondary | ICD-10-CM | POA: Diagnosis not present

## 2012-06-11 DIAGNOSIS — R350 Frequency of micturition: Secondary | ICD-10-CM | POA: Diagnosis not present

## 2012-06-11 DIAGNOSIS — R3915 Urgency of urination: Secondary | ICD-10-CM | POA: Diagnosis not present

## 2012-06-11 DIAGNOSIS — K5900 Constipation, unspecified: Secondary | ICD-10-CM | POA: Diagnosis not present

## 2012-06-11 DIAGNOSIS — N9489 Other specified conditions associated with female genital organs and menstrual cycle: Secondary | ICD-10-CM | POA: Diagnosis not present

## 2012-06-11 DIAGNOSIS — N949 Unspecified condition associated with female genital organs and menstrual cycle: Secondary | ICD-10-CM | POA: Diagnosis not present

## 2012-06-19 ENCOUNTER — Encounter: Payer: Self-pay | Admitting: Internal Medicine

## 2012-06-19 ENCOUNTER — Other Ambulatory Visit: Payer: Self-pay | Admitting: Internal Medicine

## 2012-06-19 ENCOUNTER — Other Ambulatory Visit (INDEPENDENT_AMBULATORY_CARE_PROVIDER_SITE_OTHER): Payer: Medicare Other

## 2012-06-19 ENCOUNTER — Ambulatory Visit (INDEPENDENT_AMBULATORY_CARE_PROVIDER_SITE_OTHER): Payer: Medicare Other | Admitting: Internal Medicine

## 2012-06-19 VITALS — BP 120/60 | HR 76 | Temp 98.1°F | Resp 16 | Ht 60.0 in | Wt 151.0 lb

## 2012-06-19 DIAGNOSIS — M8580 Other specified disorders of bone density and structure, unspecified site: Secondary | ICD-10-CM

## 2012-06-19 DIAGNOSIS — Z0000 Encounter for general adult medical examination without abnormal findings: Secondary | ICD-10-CM

## 2012-06-19 DIAGNOSIS — I10 Essential (primary) hypertension: Secondary | ICD-10-CM | POA: Diagnosis not present

## 2012-06-19 DIAGNOSIS — F411 Generalized anxiety disorder: Secondary | ICD-10-CM

## 2012-06-19 DIAGNOSIS — F419 Anxiety disorder, unspecified: Secondary | ICD-10-CM

## 2012-06-19 DIAGNOSIS — M545 Low back pain, unspecified: Secondary | ICD-10-CM

## 2012-06-19 DIAGNOSIS — K219 Gastro-esophageal reflux disease without esophagitis: Secondary | ICD-10-CM

## 2012-06-19 DIAGNOSIS — E039 Hypothyroidism, unspecified: Secondary | ICD-10-CM | POA: Diagnosis not present

## 2012-06-19 DIAGNOSIS — K529 Noninfective gastroenteritis and colitis, unspecified: Secondary | ICD-10-CM

## 2012-06-19 DIAGNOSIS — R5383 Other fatigue: Secondary | ICD-10-CM

## 2012-06-19 DIAGNOSIS — E785 Hyperlipidemia, unspecified: Secondary | ICD-10-CM

## 2012-06-19 DIAGNOSIS — R5381 Other malaise: Secondary | ICD-10-CM

## 2012-06-19 DIAGNOSIS — M949 Disorder of cartilage, unspecified: Secondary | ICD-10-CM | POA: Diagnosis not present

## 2012-06-19 DIAGNOSIS — Z23 Encounter for immunization: Secondary | ICD-10-CM | POA: Diagnosis not present

## 2012-06-19 DIAGNOSIS — M899 Disorder of bone, unspecified: Secondary | ICD-10-CM

## 2012-06-19 DIAGNOSIS — Z1231 Encounter for screening mammogram for malignant neoplasm of breast: Secondary | ICD-10-CM

## 2012-06-19 DIAGNOSIS — Z1239 Encounter for other screening for malignant neoplasm of breast: Secondary | ICD-10-CM

## 2012-06-19 DIAGNOSIS — K5289 Other specified noninfective gastroenteritis and colitis: Secondary | ICD-10-CM

## 2012-06-19 LAB — BASIC METABOLIC PANEL
BUN: 12 mg/dL (ref 6–23)
CO2: 26 mEq/L (ref 19–32)
Calcium: 9.1 mg/dL (ref 8.4–10.5)
Chloride: 104 mEq/L (ref 96–112)
Creatinine, Ser: 0.7 mg/dL (ref 0.4–1.2)
Glucose, Bld: 118 mg/dL — ABNORMAL HIGH (ref 70–99)

## 2012-06-19 LAB — CBC
HCT: 41.4 % (ref 36.0–46.0)
Hemoglobin: 14 g/dL (ref 12.0–15.0)
Platelets: 245 10*3/uL (ref 150.0–400.0)
RBC: 4.41 Mil/uL (ref 3.87–5.11)
WBC: 7.8 10*3/uL (ref 4.5–10.5)

## 2012-06-19 LAB — TSH: TSH: 3.37 u[IU]/mL (ref 0.35–5.50)

## 2012-06-19 LAB — LIPID PANEL
LDL Cholesterol: 69 mg/dL (ref 0–99)
Total CHOL/HDL Ratio: 3
Triglycerides: 185 mg/dL — ABNORMAL HIGH (ref 0.0–149.0)

## 2012-06-19 MED ORDER — ALPRAZOLAM 1 MG PO TABS: 1.0000 mg | ORAL_TABLET | Freq: Every evening | ORAL | Status: DC | PRN

## 2012-06-19 MED ORDER — ALPRAZOLAM 1 MG PO TABS: 1.0000 mg | ORAL_TABLET | Freq: Three times a day (TID) | ORAL | Status: DC | PRN

## 2012-06-19 MED ORDER — LEVOTHYROXINE SODIUM 25 MCG PO TABS: 25.0000 ug | ORAL_TABLET | Freq: Every evening | ORAL | Status: DC

## 2012-06-19 MED ORDER — HYDROCODONE-ACETAMINOPHEN 5-500 MG PO TABS: 1.0000 | ORAL_TABLET | ORAL | Status: DC | PRN

## 2012-06-19 MED ORDER — ROSUVASTATIN CALCIUM 20 MG PO TABS: 20.0000 mg | ORAL_TABLET | Freq: Every day | ORAL | Status: DC

## 2012-06-19 MED ORDER — PANTOPRAZOLE SODIUM 40 MG PO TBEC: 80.0000 mg | DELAYED_RELEASE_TABLET | Freq: Every day | ORAL | Status: DC

## 2012-06-19 NOTE — Progress Notes (Signed)
HPI  Pt presents to the clinic today to establish care. She was recently discharged from Dr. Fortunato Curling office. She does not know why. Her only concern today is that she needs refills on all her meds because they ran out and Dr. Cyndia Bent would not fill them.  Flu: never Tetanus: greater than 10 years LMP: total hysterectomy Colonoscopy: 2006 (5 years) Mammogram: 2011  Past Medical History  Diagnosis Date  . Chest pain, cardiac     Has chronic chest pain.   . Coronary artery disease     a) s/p CABG x 2 in [place] 2002. b)stenting to the SVG-OM in 2009. c)DES to the saphenous vein graft to the ramus in March 2012. d) s/p PTCA/DES to native ramus intermedius in September 2012. Last cath 03/2011 with minimal nonobstructive disease  e) indeterminant myoview 09/2011 but atypical CP & no perfusion defect in ramus intermediate distribution, EF 52%  . Hypertension   . Hyperlipidemia   . Tobacco abuse   . Hypothyroidism   . Hx: UTI (urinary tract infection)     multiple  . Enterocolitis   . Gastroparesis   . Complication of anesthesia     "my blood pressure will drop out on me"  . 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 Anemia   . Blood transfusion   . GERD (gastroesophageal reflux disease)   . Hematuria 09/25/11    "lately"  . GI bleeding   . Anxiety   . Depression   . PTSD (post-traumatic stress disorder)   . Migraines     h/o    Current Outpatient Prescriptions  Medication Sig Dispense Refill  . amLODipine (NORVASC) 5 MG tablet Take 1 tablet (5 mg total) by mouth every evening.  90 tablet  3  . clopidogrel (PLAVIX) 75 MG tablet Take 1 tablet (75 mg total) by mouth every evening.  90 tablet  3  . diphenhydrAMINE (BENADRYL) 25 mg capsule Take 75 mg by mouth every 6 (six) hours as needed. Allergies      . levothyroxine (SYNTHROID, LEVOTHROID) 25 MCG tablet Take 1 tablet (25 mcg total) by mouth every evening.  90 tablet  3  . metoprolol tartrate (LOPRESSOR) 25 MG  tablet Take 2 tablets (50 mg total) by mouth every evening.  90 tablet  3  . nitroGLYCERIN (NITROSTAT) 0.4 MG SL tablet Place 1 tablet (0.4 mg total) under the tongue every 5 (five) minutes as needed (up to 3 doses). For chest pain  25 tablet  4  . pantoprazole (PROTONIX) 40 MG tablet Take 2 tablets (80 mg total) by mouth daily.  180 tablet  3  . pentosan polysulfate (ELMIRON) 100 MG capsule Take 400 mg by mouth daily.      . promethazine (PHENERGAN) 25 MG tablet Take 25 mg by mouth every 6 (six) hours as needed. For nausea      . rosuvastatin (CRESTOR) 20 MG tablet Take 1 tablet (20 mg total) by mouth every evening.  90 tablet  3  . triamcinolone ointment (KENALOG) 0.5 %       . polyethylene glycol powder (GLYCOLAX/MIRALAX) powder       . zolpidem (AMBIEN) 10 MG tablet Take 10 mg by mouth at bedtime as needed. Sleep       No current facility-administered medications for this visit.   Facility-Administered Medications Ordered in Other Visits  Medication Dose Route Frequency Provider Last Rate Last Dose  . bupivacaine (MARCAINE) 0.5 %  15 mL, phenazopyridine (PYRIDIUM) 400 mg bladder mixture   Bladder Instillation Once Martina Sinner, MD        Allergies  Allergen Reactions  . Morphine And Related Other (See Comments)    Flushing & "makes me feel weird; that sets off a panic attack" Pain meds lowers blood pressure  . Ranitidine Hives  . Sulfa Antibiotics     Family History  Problem Relation Age of Onset  . Heart disease Father   . Emphysema Mother     smoked  . Asthma Mother   . Heart disease Paternal Grandfather   . Breast cancer Mother     History   Social History  . Marital Status: Widowed    Spouse Name: N/A    Number of Children: 2  . Years of Education: N/A   Occupational History  . Disabled    Social History Main Topics  . Smoking status: Current Every Day Smoker -- 0.5 packs/day for 15 years    Types: Cigarettes  . Smokeless tobacco: Never Used  . Alcohol  Use: No  . Drug Use: Yes    Special: Marijuana     Comment: "as a teen"  . Sexually Active: Not Currently   Other Topics Concern  . Not on file   Social History Narrative  . No narrative on file    ROS:  Constitutional: Denies fever, malaise, fatigue, headache or abrupt weight changes.  HEENT: Denies eye pain, eye redness, ear pain, ringing in the ears, wax buildup, runny nose, nasal congestion, bloody nose, or sore throat. Respiratory: Denies difficulty breathing, shortness of breath, cough or sputum production.   Cardiovascular: Denies chest pain, chest tightness, palpitations or swelling in the hands or feet.  Gastrointestinal: Denies abdominal pain, bloating, constipation, diarrhea or blood in the stool.  GU: Denies frequency, urgency, pain with urination, blood in urine, odor or discharge. Musculoskeletal: Pt reports chronic back pain. Denies decrease in range of motion, difficulty with gait, muscle pain or joint pain and swelling.  Skin: Denies redness, rashes, lesions or ulcercations.  Neurological: Denies dizziness, difficulty with memory, difficulty with speech or problems with balance and coordination.  Psych: Pt reports anxiety and panic attacks, Denies SI/HI  No other specific complaints in a complete review of systems (except as listed in HPI above).  PE:  BP 120/60  Pulse 76  Temp 98.1 F (36.7 C) (Oral)  Resp 16  Ht 5' (1.524 m)  Wt 151 lb (68.493 kg)  BMI 29.49 kg/m2 Wt Readings from Last 3 Encounters:  06/19/12 151 lb (68.493 kg)  06/04/12 145 lb 6.4 oz (65.953 kg)  03/18/12 150 lb 12.8 oz (68.402 kg)    General: Appears her stated age, overweight but well developed, well nourished in NAD. HEENT: Head: normal shape and size; Eyes: sclera white, no icterus, conjunctiva pink, PERRLA and EOMs intact; Ears: Tm's gray and intact, normal light reflex; Nose: mucosa pink and moist, septum midline; Throat/Mouth: Teeth present, mucosa pink and moist, no lesions or  ulcerations noted.  Neck: Normal range of motion. Neck supple, trachea midline. No massses, lumps or thyromegaly present.  Cardiovascular: Normal rate and rhythm. S1,S2 noted.  No murmur, rubs or gallops noted. No JVD or BLE edema. No carotid bruits noted. Pulmonary/Chest: Normal effort and positive vesicular breath sounds. No respiratory distress. No wheezes, rales or ronchi noted.  Abdomen: Soft and nontender. Normal bowel sounds, no bruits noted. No distention or masses noted. Liver, spleen and kidneys non palpable. Musculoskeletal: Normal  range of motion. No signs of joint swelling. No difficulty with gait.  Neurological: Alert and oriented. Cranial nerves II-XII intact. Coordination normal. +DTRs bilaterally. Psychiatric: Mood and affect normal. Behavior is normal. Judgment and thought content normal.     Assessment and Plan:  Preventative Health Maintenance:  Start diet and exercise Flu shot  And Tdap given today Will set up for Mammogram and Colonoscopy Pt defers bone density screening at this time Will obtain basic screening labs today  Hypothyroidism:  Will check TSH Based on results, will refill Synthroid  Lumbago:  Refilled Hydrocodone, will not refill early  Anxiety and Panic attacks:  Refilled xanax, will not refill early  Hypertension and Hyperlipidemia:  Will check BMP, lipids All heart meds to be refilled by cardiology  RTC i n6 months for follow up visit

## 2012-06-19 NOTE — Patient Instructions (Signed)

## 2012-06-20 LAB — VITAMIN D 25 HYDROXY (VIT D DEFICIENCY, FRACTURES): Vit D, 25-Hydroxy: 23 ng/mL — ABNORMAL LOW (ref 30–89)

## 2012-07-21 ENCOUNTER — Telehealth: Payer: Self-pay | Admitting: *Deleted

## 2012-07-21 ENCOUNTER — Other Ambulatory Visit: Payer: Self-pay | Admitting: *Deleted

## 2012-07-21 DIAGNOSIS — M545 Low back pain, unspecified: Secondary | ICD-10-CM

## 2012-07-21 DIAGNOSIS — F419 Anxiety disorder, unspecified: Secondary | ICD-10-CM

## 2012-07-21 DIAGNOSIS — K219 Gastro-esophageal reflux disease without esophagitis: Secondary | ICD-10-CM | POA: Diagnosis not present

## 2012-07-21 DIAGNOSIS — K3184 Gastroparesis: Secondary | ICD-10-CM | POA: Diagnosis not present

## 2012-07-21 MED ORDER — ALPRAZOLAM 1 MG PO TABS: 1.0000 mg | ORAL_TABLET | Freq: Three times a day (TID) | ORAL | Status: DC | PRN

## 2012-07-21 MED ORDER — HYDROCODONE-ACETAMINOPHEN 5-500 MG PO TABS: 1.0000 | ORAL_TABLET | ORAL | Status: DC | PRN

## 2012-07-21 NOTE — Telephone Encounter (Signed)
Rx for Alprazolam and Hydrocodone both faxed to [facility].

## 2012-07-21 NOTE — Telephone Encounter (Signed)
R'cd fax from [facility] for refill of Alprazolam 1mg  tablet and Hydrocodone-Acetaminophen 5-500mg  tablet-please advise on refills.

## 2012-07-21 NOTE — Telephone Encounter (Signed)
[facility] called regarding pt's Hydrocodone-Acetaminophen 5-500mg  rx. They are no longer making this strength, so they want verbal order or new rx changed to Hydrocodone-Acetaminophen 5-325. Please advise.

## 2012-07-21 NOTE — Telephone Encounter (Signed)
Ash, Refilled, printed and placed on your desk  [address]

## 2012-07-21 NOTE — Addendum Note (Signed)
Addended by: Carin Primrose on: 07/21/2012 02:14 PM   Modules accepted: Orders

## 2012-07-22 MED ORDER — HYDROCODONE-ACETAMINOPHEN 5-325 MG PO TABS: 1.0000 | ORAL_TABLET | ORAL | Status: DC | PRN

## 2012-07-22 NOTE — Telephone Encounter (Signed)
Rx called into [facility] for 5-325 dose.

## 2012-07-22 NOTE — Telephone Encounter (Signed)
Ash, Ok to change to 5-325, same number of pills, no refills [address]

## 2012-07-30 ENCOUNTER — Ambulatory Visit: Payer: Medicare Other

## 2012-08-22 ENCOUNTER — Other Ambulatory Visit: Payer: Self-pay | Admitting: *Deleted

## 2012-08-22 DIAGNOSIS — F419 Anxiety disorder, unspecified: Secondary | ICD-10-CM

## 2012-08-22 MED ORDER — HYDROCODONE-ACETAMINOPHEN 5-325 MG PO TABS: 1.0000 | ORAL_TABLET | ORAL | Status: DC | PRN

## 2012-08-22 MED ORDER — ALPRAZOLAM 1 MG PO TABS: 1.0000 mg | ORAL_TABLET | Freq: Three times a day (TID) | ORAL | Status: DC | PRN

## 2012-09-05 ENCOUNTER — Emergency Department (HOSPITAL_COMMUNITY): Payer: Medicare Other

## 2012-09-05 ENCOUNTER — Other Ambulatory Visit: Payer: Self-pay

## 2012-09-05 ENCOUNTER — Encounter (HOSPITAL_COMMUNITY): Payer: Self-pay | Admitting: Emergency Medicine

## 2012-09-05 ENCOUNTER — Emergency Department (HOSPITAL_COMMUNITY)
Admission: EM | Admit: 2012-09-05 | Discharge: 2012-09-06 | Disposition: A | Discharge status: Home / Self Care | Payer: Medicare Other | Attending: Emergency Medicine | Admitting: Emergency Medicine

## 2012-09-05 DIAGNOSIS — F1210 Cannabis abuse, uncomplicated: Secondary | ICD-10-CM | POA: Insufficient documentation

## 2012-09-05 DIAGNOSIS — F411 Generalized anxiety disorder: Secondary | ICD-10-CM | POA: Insufficient documentation

## 2012-09-05 DIAGNOSIS — R569 Unspecified convulsions: Secondary | ICD-10-CM

## 2012-09-05 DIAGNOSIS — F4310 Post-traumatic stress disorder, unspecified: Secondary | ICD-10-CM | POA: Insufficient documentation

## 2012-09-05 DIAGNOSIS — S298XXA Other specified injuries of thorax, initial encounter: Secondary | ICD-10-CM | POA: Diagnosis not present

## 2012-09-05 DIAGNOSIS — Z8719 Personal history of other diseases of the digestive system: Secondary | ICD-10-CM | POA: Insufficient documentation

## 2012-09-05 DIAGNOSIS — R4182 Altered mental status, unspecified: Secondary | ICD-10-CM | POA: Insufficient documentation

## 2012-09-05 DIAGNOSIS — E039 Hypothyroidism, unspecified: Secondary | ICD-10-CM | POA: Insufficient documentation

## 2012-09-05 DIAGNOSIS — Z8679 Personal history of other diseases of the circulatory system: Secondary | ICD-10-CM | POA: Insufficient documentation

## 2012-09-05 DIAGNOSIS — Z87448 Personal history of other diseases of urinary system: Secondary | ICD-10-CM | POA: Insufficient documentation

## 2012-09-05 DIAGNOSIS — Z87440 Personal history of urinary (tract) infections: Secondary | ICD-10-CM | POA: Insufficient documentation

## 2012-09-05 DIAGNOSIS — Z862 Personal history of diseases of the blood and blood-forming organs and certain disorders involving the immune mechanism: Secondary | ICD-10-CM | POA: Insufficient documentation

## 2012-09-05 DIAGNOSIS — F17200 Nicotine dependence, unspecified, uncomplicated: Secondary | ICD-10-CM | POA: Insufficient documentation

## 2012-09-05 DIAGNOSIS — F309 Manic episode, unspecified: Secondary | ICD-10-CM

## 2012-09-05 DIAGNOSIS — E785 Hyperlipidemia, unspecified: Secondary | ICD-10-CM | POA: Insufficient documentation

## 2012-09-05 DIAGNOSIS — Z5189 Encounter for other specified aftercare: Secondary | ICD-10-CM | POA: Insufficient documentation

## 2012-09-05 DIAGNOSIS — I2510 Atherosclerotic heart disease of native coronary artery without angina pectoris: Secondary | ICD-10-CM | POA: Insufficient documentation

## 2012-09-05 DIAGNOSIS — I10 Essential (primary) hypertension: Secondary | ICD-10-CM | POA: Insufficient documentation

## 2012-09-05 DIAGNOSIS — K219 Gastro-esophageal reflux disease without esophagitis: Secondary | ICD-10-CM | POA: Insufficient documentation

## 2012-09-05 DIAGNOSIS — Z86718 Personal history of other venous thrombosis and embolism: Secondary | ICD-10-CM | POA: Insufficient documentation

## 2012-09-05 DIAGNOSIS — Z79899 Other long term (current) drug therapy: Secondary | ICD-10-CM | POA: Insufficient documentation

## 2012-09-05 DIAGNOSIS — S0990XA Unspecified injury of head, initial encounter: Secondary | ICD-10-CM | POA: Diagnosis not present

## 2012-09-05 LAB — CBC WITH DIFFERENTIAL/PLATELET
HCT: 44.9 % (ref 36.0–46.0)
Hemoglobin: 16.1 g/dL — ABNORMAL HIGH (ref 12.0–15.0)
Lymphocytes Relative: 21 % (ref 12–46)
Lymphs Abs: 1.9 10*3/uL (ref 0.7–4.0)
Monocytes Absolute: 0.4 10*3/uL (ref 0.1–1.0)
Monocytes Relative: 4 % (ref 3–12)
Neutro Abs: 6.8 10*3/uL (ref 1.7–7.7)
WBC: 9.1 10*3/uL (ref 4.0–10.5)

## 2012-09-05 LAB — COMPREHENSIVE METABOLIC PANEL
BUN: 9 mg/dL (ref 6–23)
CO2: 22 mEq/L (ref 19–32)
Chloride: 101 mEq/L (ref 96–112)
Creatinine, Ser: 0.57 mg/dL (ref 0.50–1.10)
GFR calc non Af Amer: >90 mL/min (ref >90)
Total Bilirubin: 0.4 mg/dL (ref 0.3–1.2)

## 2012-09-05 LAB — URINE MICROSCOPIC-ADD ON

## 2012-09-05 LAB — URINALYSIS, ROUTINE W REFLEX MICROSCOPIC
Ketones, ur: 15 mg/dL — AB
Leukocytes, UA: NEGATIVE
Protein, ur: NEGATIVE mg/dL
Urobilinogen, UA: 0.2 mg/dL (ref 0.0–1.0)

## 2012-09-05 LAB — RAPID URINE DRUG SCREEN, HOSP PERFORMED
Amphetamines: NOT DETECTED
Barbiturates: NOT DETECTED

## 2012-09-05 LAB — ETHANOL: Alcohol, Ethyl (B): <11 mg/dL (ref 0–11)

## 2012-09-05 MED ORDER — ALUM & MAG HYDROXIDE-SIMETH 200-200-20 MG/5ML PO SUSP: 30.0000 mL | ORAL | Status: DC | PRN

## 2012-09-05 MED ORDER — PANTOPRAZOLE SODIUM 40 MG PO TBEC
80.0000 mg | DELAYED_RELEASE_TABLET | Freq: Every day | ORAL | Status: DC
  Administered 2012-09-06: 80 mg via ORAL
  Filled 2012-09-05: qty 2

## 2012-09-05 MED ORDER — NICOTINE 21 MG/24HR TD PT24
21.0000 mg | MEDICATED_PATCH | Freq: Every day | TRANSDERMAL | Status: DC
  Filled 2012-09-05: qty 1

## 2012-09-05 MED ORDER — CLOPIDOGREL BISULFATE 75 MG PO TABS: 75.0000 mg | ORAL_TABLET | Freq: Every evening | ORAL | Status: DC

## 2012-09-05 MED ORDER — ATORVASTATIN CALCIUM 40 MG PO TABS
40.0000 mg | ORAL_TABLET | Freq: Every day | ORAL | Status: DC
  Filled 2012-09-05 (×2): qty 1

## 2012-09-05 MED ORDER — SODIUM CHLORIDE 0.9 % IV BOLUS (SEPSIS)
1000.0000 mL | Freq: Once | INTRAVENOUS | Status: AC
  Administered 2012-09-05: 1000 mL via INTRAVENOUS

## 2012-09-05 MED ORDER — METOPROLOL SUCCINATE ER 100 MG PO TB24
100.0000 mg | ORAL_TABLET | Freq: Every evening | ORAL | Status: DC
  Administered 2012-09-06: 100 mg via ORAL
  Filled 2012-09-05 (×2): qty 1

## 2012-09-05 MED ORDER — LORAZEPAM 1 MG PO TABS
1.0000 mg | ORAL_TABLET | Freq: Three times a day (TID) | ORAL | Status: DC | PRN
  Administered 2012-09-06: 1 mg via ORAL
  Filled 2012-09-05: qty 1

## 2012-09-05 MED ORDER — ZOLPIDEM TARTRATE 5 MG PO TABS: 5.0000 mg | ORAL_TABLET | Freq: Every evening | ORAL | Status: DC | PRN

## 2012-09-05 MED ORDER — AMITRIPTYLINE HCL 25 MG PO TABS
50.0000 mg | ORAL_TABLET | Freq: Every day | ORAL | Status: DC
  Filled 2012-09-05: qty 2

## 2012-09-05 MED ORDER — ASPIRIN EC 81 MG PO TBEC
81.0000 mg | DELAYED_RELEASE_TABLET | Freq: Every evening | ORAL | Status: DC
  Administered 2012-09-06: 81 mg via ORAL
  Filled 2012-09-05 (×2): qty 1

## 2012-09-05 MED ORDER — AMLODIPINE BESYLATE 5 MG PO TABS
5.0000 mg | ORAL_TABLET | Freq: Every evening | ORAL | Status: DC
  Administered 2012-09-06: 5 mg via ORAL
  Filled 2012-09-05 (×2): qty 1

## 2012-09-05 MED ORDER — ONDANSETRON HCL 8 MG PO TABS
4.0000 mg | ORAL_TABLET | Freq: Three times a day (TID) | ORAL | Status: DC | PRN
  Administered 2012-09-06: 4 mg via ORAL
  Filled 2012-09-05: qty 1

## 2012-09-05 MED ORDER — IBUPROFEN 400 MG PO TABS: 600.0000 mg | ORAL_TABLET | Freq: Three times a day (TID) | ORAL | Status: DC | PRN

## 2012-09-05 MED ORDER — ACETAMINOPHEN 325 MG PO TABS: 650.0000 mg | ORAL_TABLET | ORAL | Status: DC | PRN

## 2012-09-05 MED ORDER — LEVOTHYROXINE SODIUM 25 MCG PO TABS
25.0000 ug | ORAL_TABLET | Freq: Every evening | ORAL | Status: DC
  Administered 2012-09-06: 25 ug via ORAL
  Filled 2012-09-05 (×2): qty 1

## 2012-09-05 NOTE — ED Provider Notes (Signed)
I saw and evaluated the patient, reviewed the resident's note and I agree with the findings and plan.  Patient seen and examined. She is alert and oriented x4 but does have some tangential thought. I do not suspect that the patient has a medical etiology of her current symptoms. Her head CT is negative. She is afebrile here. We'll speak with her family when they are here suspect that this is psychiatric in nature  Toy Baker, MD 09/05/12 1749

## 2012-09-05 NOTE — ED Notes (Signed)
Patient is resting comfortably.  Seizure pads on the bed, seizure precaution in place, patient is alert and oriented x 3.  Patient states that she feels tired and just wants to rest.  States that she needs oxygen b/c when she is in "REM her oxygen drops."  Patient is in no respiratory distress,, oxygen is at 100% on R/A.

## 2012-09-05 NOTE — ED Notes (Signed)
Family reports seizure like activity. Family states that pt was walking and fell. Pt has no pain

## 2012-09-05 NOTE — ED Notes (Signed)
Patient currently sitting up in bed; no respiratory or acute distress noted.  Patient updated on plan of care; informed patient that we are currently waiting on further orders from EDP.  Patient denies any needs at this time; will continue to monitor. 

## 2012-09-05 NOTE — ED Provider Notes (Signed)
History     CSN: [id number]  Arrival date & time 09/05/12  1544   First MD Initiated Contact with Patient 09/05/12 1550      Chief Complaint  Patient presents with  . Seizures    (Consider location/radiation/quality/duration/timing/severity/associated sxs/prior treatment) HPI Comments: 57 yo female with altered mental status.  History severely limited by altered mental status and no family available.  EMS reported that they were called for seizure.   Patient is a 57 y.o. female presenting with seizures and altered mental status.  Seizures Altered Mental Status The problem occurs constantly. Pertinent negatives include no abdominal pain, chest pain, fever, urinary symptoms, vomiting or weakness. Nothing aggravates the symptoms. She has tried nothing for the symptoms.    Past Medical History  Diagnosis Date  . Chest pain, cardiac     Has chronic chest pain.   . Coronary artery disease     a) s/p CABG x 2 in [place] 2002. b)stenting to the SVG-OM in 2009. c)DES to the saphenous vein graft to the ramus in March 2012. d) s/p PTCA/DES to native ramus intermedius in September 2012. Last cath 03/2011 with minimal nonobstructive disease  e) indeterminant myoview 09/2011 but atypical CP & no perfusion defect in ramus intermediate distribution, EF 52%  . Hypertension   . Hyperlipidemia   . Tobacco abuse   . Hypothyroidism   . Hx: UTI (urinary tract infection)     multiple  . Enterocolitis   . Gastroparesis   . Complication of anesthesia     "my blood pressure will drop out on me"  . 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 Anemia   . Blood transfusion   . GERD (gastroesophageal reflux disease)   . Hematuria 09/25/11    "lately"  . GI bleeding   . Anxiety   . Depression   . PTSD (post-traumatic stress disorder)   . Migraines     h/o    Past Surgical History  Procedure Laterality Date  . Vesicovaginal fistula closure w/ tah  1983  . Tmj arthroplasty   1980's    right side; "I've had to have it twice"  . Coronary artery bypass graft  2002    CABG X2  . Coronary angioplasty with stent placement      "lots; they've all collapsed; last time dr took native vein &  turned it around & put stents in it before attaching"  . Abdominal hysterectomy  1980's  . Salpingoophorectomy  1980's    "2 surgeries; 1st right; then left"  . Biopsy stomach  09/25/11    "several in the last year; all negative; via endoscopy"  . Pilonidal cyst / sinus excision  1980's    "was wrapped around my spinal cord; Dr. Yvette Rack got almost all of it except a tiny bit; said it was really deep; packed fatty tissue in the hole; really bothers me alot"  . Cholecystectomy  2000's  . Breast surgery      right biopsy  . Cysto with hydrodistension  03/28/2012    Procedure: CYSTOSCOPY/HYDRODISTENSION;  Surgeon: Martina Sinner, MD;  Location: [facility];  Service: Urology;  Laterality: N/A;  . Cystoscopy with injection  03/28/2012    Procedure: CYSTOSCOPY WITH INJECTION;  Surgeon: Martina Sinner, MD;  Location: [facility];  Service: Urology;  Laterality: N/A;  Marcaine and Pyridium  . Cystoscopy w/ retrogrades  03/28/2012    Procedure: CYSTOSCOPY WITH RETROGRADE PYELOGRAM;  Surgeon: Martina Sinner, MD;  Location: [facility];  Service: Urology;  Laterality: Bilateral;    Family History  Problem Relation Age of Onset  . Heart disease Father   . Emphysema Mother     smoked  . Asthma Mother   . Heart disease Paternal Grandfather   . Breast cancer Mother     History  Substance Use Topics  . Smoking status: Current Every Day Smoker -- 0.50 packs/day for 15 years    Types: Cigarettes  . Smokeless tobacco: Never Used  . Alcohol Use: No    OB History   Grav Para Term Preterm Abortions TAB SAB Ect Mult Living                  Review of Systems  Unable to perform ROS: Mental status change  Constitutional: Negative for fever.  Cardiovascular: Negative for chest pain.   Gastrointestinal: Negative for vomiting and abdominal pain.  Neurological: Positive for seizures. Negative for weakness.  Psychiatric/Behavioral: Positive for altered mental status.    Allergies  Morphine and related; Ranitidine; and Sulfa antibiotics  Home Medications   Current Outpatient Rx  Name  Route  Sig  Dispense  Refill  . ALPRAZolam (XANAX) 1 MG tablet   Oral   Take 1 tablet (1 mg total) by mouth 3 (three) times daily as needed for sleep.   90 tablet   0   . amLODipine (NORVASC) 5 MG tablet   Oral   Take 1 tablet (5 mg total) by mouth every evening.   90 tablet   3   . clopidogrel (PLAVIX) 75 MG tablet   Oral   Take 1 tablet (75 mg total) by mouth every evening.   90 tablet   3   . diphenhydrAMINE (BENADRYL) 25 mg capsule   Oral   Take 75 mg by mouth every 6 (six) hours as needed. Allergies         . HYDROcodone-acetaminophen (NORCO/VICODIN) 5-325 MG per tablet   Oral   Take 1 tablet by mouth every 4 (four) hours as needed for pain.   180 tablet   0   . levothyroxine (SYNTHROID, LEVOTHROID) 25 MCG tablet   Oral   Take 1 tablet (25 mcg total) by mouth every evening.   90 tablet   1   . metoprolol tartrate (LOPRESSOR) 25 MG tablet   Oral   Take 2 tablets (50 mg total) by mouth every evening.   90 tablet   3   . nitroGLYCERIN (NITROSTAT) 0.4 MG SL tablet   Sublingual   Place 1 tablet (0.4 mg total) under the tongue every 5 (five) minutes as needed (up to 3 doses). For chest pain   25 tablet   4   . pantoprazole (PROTONIX) 40 MG tablet   Oral   Take 2 tablets (80 mg total) by mouth daily.   180 tablet   0   . pentosan polysulfate (ELMIRON) 100 MG capsule   Oral   Take 400 mg by mouth daily.         . polyethylene glycol powder (GLYCOLAX/MIRALAX) powder               . promethazine (PHENERGAN) 25 MG tablet   Oral   Take 25 mg by mouth every 6 (six) hours as needed. For nausea         . rosuvastatin (CRESTOR) 20 MG tablet    Oral   Take 1 tablet (20 mg total) by  mouth daily.   90 tablet   1   . triamcinolone ointment (KENALOG) 0.5 %               . zolpidem (AMBIEN) 10 MG tablet   Oral   Take 10 mg by mouth at bedtime as needed. Sleep           BP 124/102  Pulse 93  Temp(Src) 98.5 F (36.9 C) (Oral)  Resp 18  SpO2 99%  Physical Exam  Nursing note and vitals reviewed. Constitutional: She appears well-developed and well-nourished. No distress.  HENT:  Head: Normocephalic and atraumatic.  Mouth/Throat: Oropharynx is clear and moist.  Eyes: Conjunctivae are normal. Pupils are equal, round, and reactive to light. No scleral icterus.  Neck: Neck supple.  Cardiovascular: Normal rate, regular rhythm, normal heart sounds and intact distal pulses.   No murmur heard. Pulmonary/Chest: Effort normal and breath sounds normal. No stridor. No respiratory distress. She has no rales.  Abdominal: Soft. Bowel sounds are normal. She exhibits no distension. There is no tenderness. There is no rebound and no guarding.  Musculoskeletal: Normal range of motion.  Neurological: She is alert. She is disoriented (to place and time). No cranial nerve deficit or sensory deficit. Coordination normal. GCS eye subscore is 4. GCS verbal subscore is 4. GCS motor subscore is 6.  Skin: Skin is warm and dry. No rash noted.  Psychiatric: She has a normal mood and affect. Her behavior is normal.    ED Course  Procedures (including critical care time)   Date: 09/05/2012  Rate: 103  Rhythm: sinus tachycardia  QRS Axis: normal  Intervals: QT prolonged  ST/T Wave abnormalities: nonspecific ST/T changes  Conduction Disutrbances:none  Narrative Interpretation:   Old EKG Reviewed: unchanged    Labs Reviewed  CBC WITH DIFFERENTIAL - Abnormal; Notable for the following:    Hemoglobin 16.1 (*)    All other components within normal limits  COMPREHENSIVE METABOLIC PANEL - Abnormal; Notable for the following:    Glucose, Bld  111 (*)    All other components within normal limits  URINALYSIS, ROUTINE W REFLEX MICROSCOPIC - Abnormal; Notable for the following:    Hgb urine dipstick SMALL (*)    Ketones, ur 15 (*)    All other components within normal limits  URINE MICROSCOPIC-ADD ON - Abnormal; Notable for the following:    Bacteria, UA FEW (*)    All other components within normal limits  ETHANOL  URINE RAPID DRUG SCREEN (HOSP PERFORMED)  ACETAMINOPHEN LEVEL  SALICYLATE LEVEL   Dg Chest 2 View  09/05/2012  *RADIOLOGY REPORT*  Clinical Data: Seizures, fall  CHEST - 2 VIEW  Comparison: 09/24/2011  Findings: Lungs are essentially clear.  No focal consolidation. No pleural effusion or pneumothorax.  Heart is normal in size. Postsurgical changes related to prior CABG.  Degenerative changes of the visualized thoracolumbar spine.  Cholecystectomy clips.  IMPRESSION: No evidence of acute cardiopulmonary disease.   Original Report Authenticated By: Charline Bills, M.D.    Ct Head Wo Contrast  09/05/2012  *RADIOLOGY REPORT*  Clinical Data: Seizure.  Fall.  CT HEAD WITHOUT CONTRAST  Technique:  Contiguous axial images were obtained from the base of the skull through the vertex without contrast.  Comparison: None.  Findings: There is advanced for age cortical atrophy.  No evidence of acute intracranial abnormality including infarction, hemorrhage, mass lesion, mass effect, midline shift or abnormal extra-axial fluid collection.  No hydrocephalus or pneumocephalus.  The calvarium is intact.  IMPRESSION:  1.  No acute finding. 2.  Cortical atrophy.   Original Report Authenticated By: Holley Dexter, M.D.   All radiology studies independently viewed by me.      1. Manic episode   2. Seizure-like activity       MDM  57 yo female presenting with altered mental status.  Presented after possible seizure like activity witnessed by her son.  (Although, he was not present or available via phone during initial several hours of ED  visit.)  Initial impression was altered mental status secondary to pyschiatric disturbance, specifically acute mania.  CT head, labwork, EKG all unremarkable.  She remained well appearing and HDS during my shift.  Unclear if she truly had a seizure, but she has not exhibited seizure like activity in ED and she was not typically post ictal on arrival.  She was slightly disoriented on arrival, but able to answer questions, follow commands.  She had pressured speech and flight of ideas.  She reported not sleeping for 3 days.  She has a history of Bipolar.  Have consulted ACT team for psychiatric evaluation.  Placed psych holding orders.  Care transferred to Dr. Lavella Lemons.          Rennis Petty, MD 09/05/12 2357  Rennis Petty, MD 09/06/12 [phone]

## 2012-09-05 NOTE — ED Notes (Signed)
Pt taken to xray 

## 2012-09-05 NOTE — ED Notes (Signed)
Pt walked to bathroom for urine sample. Pt came out of the bathroom with no urine sample. Pt stated " her brain would not let her urinate in the cup."

## 2012-09-05 NOTE — ED Notes (Signed)
In and out cath completed 

## 2012-09-05 NOTE — ED Notes (Signed)
Still waiting for ACT team to call back and come and evaluate patient.  Family at bedside.  Patient given ice water to drink.  Patient did not want anything else at this time.

## 2012-09-05 NOTE — ED Notes (Signed)
Received bedside report from [place].  Patient currently sitting up in bed; no respiratory or acute distress noted.  Patient updated on plan of care; informed patient that we are currently waiting on further orders/disposition from EDP.  Patient denies any other needs at this time; will continue to monitor.

## 2012-09-06 LAB — ACETAMINOPHEN LEVEL: Acetaminophen (Tylenol), Serum: <15 ug/mL (ref 10–30)

## 2012-09-06 NOTE — ED Notes (Signed)
Patient requesting to speak with Chaplin.  Chaplin notified.

## 2012-09-06 NOTE — ED Provider Notes (Signed)
Patient has been evaluated by act team. She does not meet inpatient criteria. She no longer appears to be manic. She is awake and alert and oriented. She denies any suicidal or homicidal ideation. She denies any hallucinations. She denies complaint in stable for discharge. BP 109/58  Pulse 86  Temp(Src) 98.8 F (37.1 C) (Oral)  Resp 18  SpO2 98%   Glynn Octave, MD 09/06/12 1531

## 2012-09-06 NOTE — ED Provider Notes (Signed)
I saw and evaluated the patient, reviewed the resident's note and I agree with the findings and plan.  Toy Baker, MD 09/06/12 [phone]

## 2012-09-06 NOTE — BH Assessment (Signed)
Assessment Note   [name] is an 57 y.o. female. Pt. Presents calm and cooperative.  Pt. Reports symptoms of tearfulness, increased withdrawal, physical exhaustion, family issues, history of trauma, decreased sleeping and eating.  Pt. Reports mental health hx and dx of PTSD after father was murdered in 1999.  Pt. Reports one prior inpatient stay due to increased depressive symptoms in 1980.  Denies any SI attempts.  Pt. Reports no AVH or HI.  Pt is the primary caretaker for older children who reside in her home and 56 old granddaughter.  Pt. Reports various stressors in her life with family members and having a seizure on yesterday for the first time in her life. Pt. Also having financial issues.  Pt denied any drug or alcohol use.  Pt. Reports "I am physically, emotionally and mentally tired".  Pt. Has a hx of medical issues and refuses any medication for mood management.  Pt. Identifies her stressors as family and their needs; pt. Is fearful of what could happen if she continues to live this way.  Pt. Does have  limited insight into issues, and is ambivalent regarding follow up with outpatient treatment.  Pt. Agrees to follow up with OP MH Provider once she "takes care of other family matters"; and may go stay with sister in [place] to get some rest.  Pt. Also will follow up with medical doctor for medical related issues.  ACT staff recommends outpatient follow up as patient does not criteria for inpatient admission at this time.    Axis I: Anxiety Disorder NOS and Major Depression, Recurrent severe Axis II:  None Axis III: see below Axis IV:  Stressors in home environment; family conflict; death/grieving Axis V:  65  Past Medical History:  Past Medical History  Diagnosis Date  . Chest pain, cardiac     Has chronic chest pain.   . Coronary artery disease     a) s/p CABG x 2 in [place] 2002. b)stenting to the SVG-OM in 2009. c)DES to the saphenous vein graft to the ramus in March  2012. d) s/p PTCA/DES to native ramus intermedius in September 2012. Last cath 03/2011 with minimal nonobstructive disease  e) indeterminant myoview 09/2011 but atypical CP & no perfusion defect in ramus intermediate distribution, EF 52%  . Hypertension   . Hyperlipidemia   . Tobacco abuse   . Hypothyroidism   . Hx: UTI (urinary tract infection)     multiple  . Enterocolitis   . Gastroparesis   . Complication of anesthesia     "my blood pressure will drop out on me"  . 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 Anemia   . Blood transfusion   . GERD (gastroesophageal reflux disease)   . Hematuria 09/25/11    "lately"  . GI bleeding   . Anxiety   . Depression   . PTSD (post-traumatic stress disorder)   . Migraines     h/o    Past Surgical History  Procedure Laterality Date  . Vesicovaginal fistula closure w/ tah  1983  . Tmj arthroplasty  1980's    right side; "I've had to have it twice"  . Coronary artery bypass graft  2002    CABG X2  . Coronary angioplasty with stent placement      "lots; they've all collapsed; last time dr took native vein &  turned it around & put stents in it before attaching"  . Abdominal hysterectomy  1980's  . Salpingoophorectomy  1980's    "2 surgeries; 1st right; then left"  . Biopsy stomach  09/25/11    "several in the last year; all negative; via endoscopy"  . Pilonidal cyst / sinus excision  1980's    "was wrapped around my spinal cord; Dr. Yvette Rack got almost all of it except a tiny bit; said it was really deep; packed fatty tissue in the hole; really bothers me alot"  . Cholecystectomy  2000's  . Breast surgery      right biopsy  . Cysto with hydrodistension  03/28/2012    Procedure: CYSTOSCOPY/HYDRODISTENSION;  Surgeon: Martina Sinner, MD;  Location: [facility];  Service: Urology;  Laterality: N/A;  . Cystoscopy with injection  03/28/2012    Procedure: CYSTOSCOPY WITH INJECTION;  Surgeon: Martina Sinner, MD;  Location: [facility];  Service: Urology;  Laterality: N/A;  Marcaine and Pyridium  . Cystoscopy w/ retrogrades  03/28/2012    Procedure: CYSTOSCOPY WITH RETROGRADE PYELOGRAM;  Surgeon: Martina Sinner, MD;  Location: [facility];  Service: Urology;  Laterality: Bilateral;    Family History:  Family History  Problem Relation Age of Onset  . Heart disease Father   . Emphysema Mother     smoked  . Asthma Mother   . Heart disease Paternal Grandfather   . Breast cancer Mother     Social History:  reports that she has been smoking Cigarettes.  She has a 7.5 pack-year smoking history. She has never used smokeless tobacco. She reports that she uses illicit drugs (Marijuana). She reports that she does not drink alcohol.  Additional Social History:     CIWA: CIWA-Ar BP: 111/41 mmHg Pulse Rate: 86 COWS:    Allergies:  Allergies  Allergen Reactions  . Morphine And Related Other (See Comments)    Flushing & "makes me feel weird; that sets off a panic attack" Pain meds lowers blood pressure  . Ranitidine Hives  . Sulfa Antibiotics Other (See Comments)    Childhood reaction    Home Medications:  (Not in a hospital admission)  OB/GYN Status:  No LMP recorded. Patient has had a hysterectomy.  General Assessment Data Location of Assessment: [facility] Assessment Services ACT Assessment: Yes Living Arrangements: Other relatives Can pt return to current living arrangement?: Yes Admission Status: Voluntary Is patient capable of signing voluntary admission?: Yes Transfer from: Home Referral Source: MD  Education Status Is patient currently in school?: No  Risk to self Suicidal Ideation: No Suicidal Intent: No Is patient at risk for suicide?: No Suicidal Plan?: No Access to Means: Yes Specify Access to Suicidal Means: weapons and narcotics in the home What has been your use of drugs/alcohol within the last 12 months?: denies Previous Attempts/Gestures: No How many times?: 0 Other Self Harm Risks:  overexhaustion Triggers for Past Attempts: Unknown Intentional Self Injurious Behavior: None Family Suicide History: No Recent stressful life event(s): Legal Issues;Financial Problems;Loss (Comment);Turmoil (Comment) Persecutory voices/beliefs?: No Depression: Yes Depression Symptoms: Tearfulness;Isolating;Guilt Substance abuse history and/or treatment for substance abuse?: No Suicide prevention information given to non-admitted patients: Not applicable  Risk to Others Homicidal Ideation: No Thoughts of Harm to Others: No Current Homicidal Intent: No Current Homicidal Plan: No Access to Homicidal Means: No Identified Victim: denies History of harm to others?: No Assessment of Violence: None Noted Violent Behavior Description: none Does patient have access to weapons?: Yes (Comment) Criminal Charges Pending?: No Does patient have a court date: No  Psychosis Hallucinations: None noted  Delusions: None noted  Mental Status Report Appear/Hygiene: Disheveled Eye Contact: Good Motor Activity: Freedom of movement Speech: Logical/coherent Level of Consciousness: Alert Mood: Depressed;Ambivalent Affect: Appropriate to circumstance Anxiety Level: Minimal Thought Processes: Coherent;Relevant Judgement: Unimpaired Orientation: Person;Place;Time;Situation Obsessive Compulsive Thoughts/Behaviors: None  Cognitive Functioning Concentration: Normal Memory: Recent Intact;Remote Intact IQ: Average Insight: Good Impulse Control: Good Appetite: Poor Weight Loss: 5 Weight Gain: 0 Sleep: Decreased Total Hours of Sleep: 0 Vegetative Symptoms: None  ADLScreening [facility]) Patient's cognitive ability adequate to safely complete daily activities?: Yes Patient able to express need for assistance with ADLs?: Yes Independently performs ADLs?: Yes (appropriate for developmental age)  Abuse/Neglect [facility]) Physical Abuse: Denies Verbal Abuse: Denies Sexual Abuse:  Denies  Prior Inpatient Therapy Prior Inpatient Therapy: Yes Prior Therapy Dates: 1989 Prior Therapy Facilty/Provider(s): [facility] Reason for Treatment: Depression  Prior Outpatient Therapy Prior Outpatient Therapy: No  ADL Screening (condition at time of admission) Patient's cognitive ability adequate to safely complete daily activities?: Yes Patient able to express need for assistance with ADLs?: Yes Independently performs ADLs?: Yes (appropriate for developmental age)       Abuse/Neglect Assessment (Assessment to be complete while patient is alone) Physical Abuse: Denies Verbal Abuse: Denies Sexual Abuse: Denies Values / Beliefs Cultural Requests During Hospitalization: None Spiritual Requests During Hospitalization: None        Additional Information 1:1 In Past 12 Months?: No CIRT Risk: No Elopement Risk: No Does patient have medical clearance?: Yes     Disposition: Provided patient with list of OP MH Providers.  Informed ER Nurse of disposition.  Pt. To  Be discharged by ER Physician. Disposition Initial Assessment Completed: Yes Disposition of Patient: Outpatient treatment Type of outpatient treatment: Adult (referred to choice of providers)  On Site Evaluation by:   Reviewed with Physician:     Barbaraann Boys 09/06/2012 2:58 PM

## 2012-09-15 DIAGNOSIS — K209 Esophagitis, unspecified without bleeding: Secondary | ICD-10-CM | POA: Diagnosis not present

## 2012-09-15 DIAGNOSIS — R197 Diarrhea, unspecified: Secondary | ICD-10-CM | POA: Diagnosis not present

## 2012-09-15 DIAGNOSIS — K5190 Ulcerative colitis, unspecified, without complications: Secondary | ICD-10-CM | POA: Diagnosis not present

## 2012-09-15 DIAGNOSIS — R110 Nausea: Secondary | ICD-10-CM | POA: Diagnosis not present

## 2012-09-19 ENCOUNTER — Telehealth: Payer: Self-pay | Admitting: Internal Medicine

## 2012-09-19 DIAGNOSIS — F419 Anxiety disorder, unspecified: Secondary | ICD-10-CM

## 2012-09-19 MED ORDER — ALPRAZOLAM 1 MG PO TABS: 1.0000 mg | ORAL_TABLET | Freq: Three times a day (TID) | ORAL | Status: DC | PRN

## 2012-09-19 MED ORDER — HYDROCODONE-ACETAMINOPHEN 10-325 MG PO TABS: 1.0000 | ORAL_TABLET | ORAL | Status: DC | PRN

## 2012-09-19 NOTE — Telephone Encounter (Signed)
R'cd fax from [facility] for refill of Hydrocodone-APAP 10-325-last written 08/22/2012 #180 with 0 refills and Alprazolam 1mg -last written 08/22/2012 #90 with 0 refills-please advise.

## 2012-09-19 NOTE — Telephone Encounter (Signed)
Ok to refill both [address]

## 2012-09-19 NOTE — Telephone Encounter (Signed)
Rx printed, awaiting NP's signature.  

## 2012-09-19 NOTE — Telephone Encounter (Signed)
Rx's faxed to [facility].

## 2012-09-19 NOTE — Telephone Encounter (Signed)
Pt req refill for Hydrocodone and Xanax. Please advise. Pt stated drug store req on the 09/17/12, not in system.

## 2012-09-22 ENCOUNTER — Telehealth: Payer: Self-pay | Admitting: Internal Medicine

## 2012-09-22 NOTE — Telephone Encounter (Signed)
Caller: Rudean/; Phone: [phone]; Reason for Call: Patient is caller.  She states she gets Xanax from Dr.  Sampson Si and Valium from Dr.  Logan Bores.  He is the Urologist.  He has her on a trial medicine for Intercystial cystitis.  She needs the refill on the Xanax.

## 2012-09-22 NOTE — Telephone Encounter (Signed)
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 Rx for Valium was written for 0.5 mg bid. Pharmacy needs authorization to refill rx for Xanax.

## 2012-09-22 NOTE — Telephone Encounter (Signed)
Pt states that rx for Valium is temporary and she would like to stay on Alprazolam. She states that she was hospitalized with seizures because of abrupt discontinuation of Xanax.

## 2012-09-22 NOTE — Telephone Encounter (Signed)
Ash, If she is on the Valium, she does not need xanax. They are both the same medication, the valium is just longer acting [address]

## 2012-09-22 NOTE — Telephone Encounter (Signed)
Pt informed of NP's advisement. Pt states that she will stop taking Valium if NP wants her to. She only has a few days left of the medication and is scared to stop completely because she is afraid to have another seizure.

## 2012-09-22 NOTE — Telephone Encounter (Signed)
Ash, Again. The medications are the same. When she comes off the valium, we will restart her xanax. She is not going to go through withdrawel of xanax because valium is the same class. If she takes them both together, it may cause excessive drowsiness and respiratory depression. Rene Kocher

## 2012-09-23 NOTE — Telephone Encounter (Signed)
Left message for pt to callback office.  

## 2012-09-23 NOTE — Telephone Encounter (Signed)
Pt informed of NP's advisement to discontinue Valium prescription and to restart Xanax. Also informed pharmacy of same and okay to refill Alprazolam.

## 2012-09-23 NOTE — Telephone Encounter (Signed)
Ash, OK to refill xanax but she needs to stop the valium. I agree, I do not want her to stop abruptly because she may go through withdrawel. Rene Kocher

## 2012-10-01 DIAGNOSIS — N3010 Interstitial cystitis (chronic) without hematuria: Secondary | ICD-10-CM | POA: Diagnosis not present

## 2012-10-01 DIAGNOSIS — R350 Frequency of micturition: Secondary | ICD-10-CM | POA: Diagnosis not present

## 2012-10-01 DIAGNOSIS — N949 Unspecified condition associated with female genital organs and menstrual cycle: Secondary | ICD-10-CM | POA: Diagnosis not present

## 2012-10-01 DIAGNOSIS — K5900 Constipation, unspecified: Secondary | ICD-10-CM | POA: Diagnosis not present

## 2012-10-01 DIAGNOSIS — N9489 Other specified conditions associated with female genital organs and menstrual cycle: Secondary | ICD-10-CM | POA: Diagnosis not present

## 2012-10-01 DIAGNOSIS — R3915 Urgency of urination: Secondary | ICD-10-CM | POA: Diagnosis not present

## 2012-10-01 DIAGNOSIS — R109 Unspecified abdominal pain: Secondary | ICD-10-CM | POA: Diagnosis not present

## 2012-10-01 DIAGNOSIS — R110 Nausea: Secondary | ICD-10-CM | POA: Diagnosis not present

## 2012-10-22 ENCOUNTER — Telehealth: Payer: Self-pay | Admitting: Internal Medicine

## 2012-10-22 DIAGNOSIS — F419 Anxiety disorder, unspecified: Secondary | ICD-10-CM

## 2012-10-22 MED ORDER — HYDROCODONE-ACETAMINOPHEN 10-325 MG PO TABS: 1.0000 | ORAL_TABLET | ORAL | Status: DC | PRN

## 2012-10-22 MED ORDER — ALPRAZOLAM 1 MG PO TABS: 1.0000 mg | ORAL_TABLET | Freq: Three times a day (TID) | ORAL | Status: DC | PRN

## 2012-10-22 NOTE — Telephone Encounter (Signed)
Ash, Ok to refill both, same quantity and same number of refills [address]

## 2012-10-22 NOTE — Telephone Encounter (Signed)
Patient is calling in for refill of her Xanax and Norco pain medication. She states the pharmacy states they have faxed multiple request with no response.  She has been without her pain medication for two days. PLEASE CONTACT AND UPDATE HER ON [phone].  Last office visit 06/19/13.

## 2012-10-22 NOTE — Telephone Encounter (Signed)
Both rx's called into [facility]. Pt informed.

## 2012-11-04 ENCOUNTER — Ambulatory Visit: Payer: Medicare Other | Admitting: Cardiology

## 2012-11-21 ENCOUNTER — Telehealth: Payer: Self-pay | Admitting: Internal Medicine

## 2012-11-21 DIAGNOSIS — F419 Anxiety disorder, unspecified: Secondary | ICD-10-CM

## 2012-11-21 MED ORDER — ALPRAZOLAM 1 MG PO TABS: 1.0000 mg | ORAL_TABLET | Freq: Three times a day (TID) | ORAL | Status: DC | PRN

## 2012-11-21 MED ORDER — HYDROCODONE-ACETAMINOPHEN 10-325 MG PO TABS: 1.0000 | ORAL_TABLET | ORAL | Status: DC | PRN

## 2012-11-21 NOTE — Telephone Encounter (Signed)
Pt informed rx's ready, faxed to [facility] per pt's request.

## 2012-11-21 NOTE — Telephone Encounter (Signed)
Ok to refill both, same dose, quantity and refills

## 2012-11-21 NOTE — Telephone Encounter (Signed)
Requesting refills on  Xanax and Hydrocodone.  Leaving town [employer].  Wants to pick up by 5 today.

## 2012-12-15 DIAGNOSIS — R197 Diarrhea, unspecified: Secondary | ICD-10-CM | POA: Diagnosis not present

## 2012-12-18 ENCOUNTER — Encounter: Payer: Self-pay | Admitting: Internal Medicine

## 2012-12-18 ENCOUNTER — Other Ambulatory Visit: Payer: Self-pay | Admitting: Internal Medicine

## 2012-12-18 ENCOUNTER — Other Ambulatory Visit (INDEPENDENT_AMBULATORY_CARE_PROVIDER_SITE_OTHER): Payer: Medicare Other

## 2012-12-18 ENCOUNTER — Ambulatory Visit (INDEPENDENT_AMBULATORY_CARE_PROVIDER_SITE_OTHER): Payer: Medicare Other | Admitting: Internal Medicine

## 2012-12-18 VITALS — BP 102/76 | HR 83 | Temp 98.4°F | Ht 60.0 in | Wt 148.0 lb

## 2012-12-18 DIAGNOSIS — M545 Low back pain, unspecified: Secondary | ICD-10-CM | POA: Diagnosis not present

## 2012-12-18 DIAGNOSIS — I10 Essential (primary) hypertension: Secondary | ICD-10-CM

## 2012-12-18 DIAGNOSIS — I2510 Atherosclerotic heart disease of native coronary artery without angina pectoris: Secondary | ICD-10-CM

## 2012-12-18 DIAGNOSIS — F411 Generalized anxiety disorder: Secondary | ICD-10-CM

## 2012-12-18 DIAGNOSIS — E785 Hyperlipidemia, unspecified: Secondary | ICD-10-CM

## 2012-12-18 DIAGNOSIS — E039 Hypothyroidism, unspecified: Secondary | ICD-10-CM

## 2012-12-18 DIAGNOSIS — R350 Frequency of micturition: Secondary | ICD-10-CM

## 2012-12-18 DIAGNOSIS — F17200 Nicotine dependence, unspecified, uncomplicated: Secondary | ICD-10-CM

## 2012-12-18 DIAGNOSIS — F419 Anxiety disorder, unspecified: Secondary | ICD-10-CM

## 2012-12-18 LAB — URINALYSIS, ROUTINE W REFLEX MICROSCOPIC
Ketones, ur: NEGATIVE
Leukocytes, UA: NEGATIVE
Specific Gravity, Urine: 1.025 (ref 1.000–1.030)
Urobilinogen, UA: 0.2 (ref 0.0–1.0)

## 2012-12-18 MED ORDER — CIPROFLOXACIN HCL 500 MG PO TABS: 500.0000 mg | ORAL_TABLET | Freq: Two times a day (BID) | ORAL | Status: DC

## 2012-12-18 MED ORDER — HYDROCODONE-ACETAMINOPHEN 10-325 MG PO TABS: 1.0000 | ORAL_TABLET | ORAL | Status: DC | PRN

## 2012-12-18 MED ORDER — ALPRAZOLAM 1 MG PO TABS: 1.0000 mg | ORAL_TABLET | Freq: Three times a day (TID) | ORAL | Status: DC | PRN

## 2012-12-18 NOTE — Assessment & Plan Note (Signed)
Well controlled Continue to work on diet and exercise Will recheck lipids in 6 months Continue current meds

## 2012-12-18 NOTE — Assessment & Plan Note (Signed)
Continue current medications Will repeat labs in 6 months

## 2012-12-18 NOTE — Assessment & Plan Note (Signed)
-   Cessation counseling provided. 

## 2012-12-18 NOTE — Progress Notes (Signed)
Subjective:    Patient ID: [name], female    DOB: 12/08/1955, 57 y.o.   MRN: [id number]  HPI  Pt presents to the clinic today with c/o 6 month follow up of chronic medical conditions:  HTN:On norvasc and toprol. Well controlled. No problems with the medications.  HLD: On crestor. Trying to avoid fried and fatty foods. Last labs reviewed, triglycerides slightly elevated. Has not had time to work on exercise.  CAD: On aspirin and plavix. Nitroglycerin as needed. Has not had any palpitations or had to use nitro in the last 3 months.  Hypothyroidism: On synthroid. Tolerating well. Energy level improved.  Anxiety: On xanax and elavil. Doing much better. Will need refill of Xanax today.  Lumbago: chronic, well controlled on Norco. Will need refill today.  She did have a new onset seizure and was hospitalized for 3 days. They felt the seizure was related to stress and fatigue. She is not on any seizure prophylaxis at this time. She does c/o frequency and back pain. This started 2 days ago. She thinks she may have a UTI. She denies fever, chills and body aches. She has not taken anything OTC.  Review of Systems      Past Medical History  Diagnosis Date  . Chest pain, cardiac     Has chronic chest pain.   . Coronary artery disease     a) s/p CABG x 2 in [place] 2002. b)stenting to the SVG-OM in 2009. c)DES to the saphenous vein graft to the ramus in March 2012. d) s/p PTCA/DES to native ramus intermedius in September 2012. Last cath 03/2011 with minimal nonobstructive disease  e) indeterminant myoview 09/2011 but atypical CP & no perfusion defect in ramus intermediate distribution, EF 52%  . Hypertension   . Hyperlipidemia   . Tobacco abuse   . Hypothyroidism   . Hx: UTI (urinary tract infection)     multiple  . Enterocolitis   . Gastroparesis   . Complication of anesthesia     "my blood pressure will drop out on me"  . Embolism - blood clot 2002    "in my heart after  bypass"  . Anemia   . Blood transfusion   . GERD (gastroesophageal reflux disease)   . Hematuria 09/25/11    "lately"  . GI bleeding   . Anxiety   . Depression   . PTSD (post-traumatic stress disorder)   . Migraines     h/o    Current Outpatient Prescriptions  Medication Sig Dispense Refill  . ALPRAZolam (XANAX) 1 MG tablet Take 1 tablet (1 mg total) by mouth 3 (three) times daily as needed for sleep.  90 tablet  0  . amitriptyline (ELAVIL) 50 MG tablet Take 50 mg by mouth at bedtime.       Marland Kitchen amLODipine (NORVASC) 5 MG tablet Take 1 tablet (5 mg total) by mouth every evening.  90 tablet  3  . aspirin EC 81 MG tablet Take 81 mg by mouth every evening.      . clopidogrel (PLAVIX) 75 MG tablet Take 1 tablet (75 mg total) by mouth every evening.  90 tablet  3  . diphenhydrAMINE (BENADRYL) 25 mg capsule Take 50-75 mg by mouth daily as needed for allergies.       Marland Kitchen HYDROcodone-acetaminophen (NORCO) 10-325 MG per tablet Take 1 tablet by mouth every 4 (four) hours as needed for pain.  180 tablet  0  . hydrOXYzine (VISTARIL) 25 MG capsule Take 25 mg  by mouth daily as needed for itching.       . levothyroxine (SYNTHROID, LEVOTHROID) 25 MCG tablet Take 1 tablet (25 mcg total) by mouth every evening.  90 tablet  1  . metoprolol succinate (TOPROL-XL) 50 MG 24 hr tablet Take 100 mg by mouth every evening. Take with or immediately following a meal.      . nitroGLYCERIN (NITROSTAT) 0.4 MG SL tablet Place 1 tablet (0.4 mg total) under the tongue every 5 (five) minutes as needed (up to 3 doses). For chest pain  25 tablet  4  . pantoprazole (PROTONIX) 40 MG tablet Take 2 tablets (80 mg total) by mouth daily.  180 tablet  0  . polyethylene glycol powder (GLYCOLAX/MIRALAX) powder Take 17 g by mouth daily as needed (for constipation).       . promethazine (PHENERGAN) 25 MG tablet Take 25 mg by mouth every 6 (six) hours as needed. For nausea      . rosuvastatin (CRESTOR) 20 MG tablet Take 20 mg by mouth every  evening.       No current facility-administered medications for this visit.   Facility-Administered Medications Ordered in Other Visits  Medication Dose Route Frequency Provider Last Rate Last Dose  . bupivacaine (MARCAINE) 0.5 % 15 mL, phenazopyridine (PYRIDIUM) 400 mg bladder mixture   Bladder Instillation Once Martina Sinner, MD        Allergies  Allergen Reactions  . Morphine And Related Other (See Comments)    Flushing & "makes me feel weird; that sets off a panic attack" Pain meds lowers blood pressure  . Ranitidine Hives  . Sulfa Antibiotics Other (See Comments)    Childhood reaction    Family History  Problem Relation Age of Onset  . Heart disease Father   . Emphysema Mother     smoked  . Asthma Mother   . Heart disease Paternal Grandfather   . Breast cancer Mother     History   Social History  . Marital Status: Widowed    Spouse Name: N/A    Number of Children: 2  . Years of Education: N/A   Occupational History  . Disabled    Social History Main Topics  . Smoking status: Current Every Day Smoker -- 0.50 packs/day for 15 years    Types: Cigarettes  . Smokeless tobacco: Never Used  . Alcohol Use: No  . Drug Use: Yes    Special: Marijuana     Comment: "as a teen"  . Sexually Active: Not Currently   Other Topics Concern  . Not on file   Social History Narrative  . No narrative on file     Constitutional: Denies fever, malaise, fatigue, headache or abrupt weight changes.  Respiratory: Denies difficulty breathing, shortness of breath, cough or sputum production.   Cardiovascular: Denies chest pain, chest tightness, palpitations or swelling in the hands or feet.  GU: Pt reports frequency. Denies urgency, pain with urination, burning sensation, blood in urine, odor or discharge. Musculoskeletal: Pt reports low back pain. Denies decrease in range of motion, difficulty with gait, muscle pain or joint pain and swelling.  Neurological: Denies  dizziness, difficulty with memory, difficulty with speech or problems with balance and coordination.   No other specific complaints in a complete review of systems (except as listed in HPI above).  Objective:   Physical Exam  BP 102/76  Pulse 83  Temp(Src) 98.4 F (36.9 C) (Oral)  Ht 5' (1.524 m)  Wt 148 lb (67.132  kg)  BMI 28.9 kg/m2  SpO2 9% Wt Readings from Last 3 Encounters:  12/18/12 148 lb (67.132 kg)  06/19/12 151 lb (68.493 kg)  06/04/12 145 lb 6.4 oz (65.953 kg)    General: Appears her stated age, well developed, well nourished in NAD.Marland Kitchen  Cardiovascular: Normal rate and rhythm. S1,S2 noted.  No murmur, rubs or gallops noted. No JVD or BLE edema. No carotid bruits noted. Pulmonary/Chest: Normal effort and positive vesicular breath sounds. No respiratory distress. No wheezes, rales or ronchi noted.  Musculoskeletal: Normal range of motion. No signs of joint swelling. No difficulty with gait.  Neurological: Alert and oriented. Cranial nerves II-XII intact. Coordination normal. +DTRs bilaterally. Psychiatric: Mood and affect normal. Behavior is normal. Judgment and thought content normal.     BMET    Component Value Date/Time   NA 139 09/05/2012 1620   K 3.7 09/05/2012 1620   CL 101 09/05/2012 1620   CO2 22 09/05/2012 1620   GLUCOSE 111* 09/05/2012 1620   BUN 9 09/05/2012 1620   CREATININE 0.57 09/05/2012 1620   CALCIUM 10.0 09/05/2012 1620   GFRNONAA >90 09/05/2012 1620   GFRAA >90 09/05/2012 1620    Lipid Panel     Component Value Date/Time   CHOL 152 06/19/2012 1104   TRIG 185.0* 06/19/2012 1104   HDL 46.40 06/19/2012 1104   CHOLHDL 3 06/19/2012 1104   VLDL 37.0 06/19/2012 1104   LDLCALC 69 06/19/2012 1104    CBC    Component Value Date/Time   WBC 9.1 09/05/2012 1620   RBC 5.03 09/05/2012 1620   HGB 16.1* 09/05/2012 1620   HCT 44.9 09/05/2012 1620   PLT 228 09/05/2012 1620   MCV 89.3 09/05/2012 1620   MCH 32.0 09/05/2012 1620   MCHC 35.9 09/05/2012 1620   RDW  12.7 09/05/2012 1620   LYMPHSABS 1.9 09/05/2012 1620   MONOABS 0.4 09/05/2012 1620   EOSABS 0.0 09/05/2012 1620   BASOSABS 0.0 09/05/2012 1620    Hgb A1C Lab Results  Component Value Date   HGBA1C 5.7* 03/08/2011         Assessment & Plan:   Urinary frequency and back pain, new onset:  Will obtain urinalysis If positive will call in Cipro

## 2012-12-18 NOTE — Assessment & Plan Note (Signed)
Well controlled Continue current meds Counseling for smoking cessation provided

## 2012-12-18 NOTE — Assessment & Plan Note (Signed)
Refilled Norco today.

## 2012-12-18 NOTE — Patient Instructions (Signed)
Seizure, Adult A seizure is abnormal electrical activity in the brain. Seizures can cause a change in attention or behavior (altered mental status). Seizures often involve uncontrollable shaking (convulsions). Seizures usually last from 30 seconds to 2 minutes. Epilepsy is a brain disorder in which a patient has repeated seizures over time. CAUSES  There are many different problems that can cause seizures. In some cases, no cause is found. Common causes of seizures include:  Head injuries.  Brain tumors.  Infections.  Imbalance of chemicals in the blood.  Kidney failure or liver failure.  Heart disease.  Drug abuse.  Stroke.  Withdrawal from certain drugs or alcohol.  Birth defects.  Malfunction of a neurosurgical device placed in the brain. SYMPTOMS  Symptoms vary depending on the part of the brain that is involved. Right before a seizure, you may have a warning (aura) that a seizure is about to occur. An aura may include the following symptoms:   Fear or anxiety.  Nausea.  Feeling like the room is spinning (vertigo).  Vision changes, such as seeing flashing lights or spots. Common symptoms during a seizure include:  Convulsions.  Drooling.  Rapid eye movements.  Grunting.  Loss of bladder and bowel control.  Bitter taste in the mouth. After a seizure, you may feel confused and sleepy. You may also have an injury resulting from convulsions during the seizure. DIAGNOSIS  Your caregiver will perform a physical exam and run some tests to determine the type and cause of your seizure. These tests may include:  Blood tests.  A lumbar puncture test. In this test, a small amount of fluid is removed from the spine and examined.  Electrocardiography (ECG). This test records the electrical activity in your heart.  Imaging tests, such as computed tomography (CT) scans or magnetic resonance imaging (MRI).  Electroencephalography (EEG). This test records the electrical  activity in your brain. TREATMENT  Seizures usually stop on their own. Treatment will depend on the cause of your seizure. In some cases, medicine may be given to prevent future seizures. HOME CARE INSTRUCTIONS   If you are given medicines, take them exactly as prescribed by your caregiver.  Keep all follow-up appointments as directed by your caregiver.  Do not swim or drive until your caregiver says it is okay.  Teach friends and family what to do if you have a seizure. They should:  Lay you on the ground to prevent a fall.  Put a cushion under your head.  Loosen any tight clothing around your neck.  Turn you on your side. If vomiting occurs, this helps keep your airway clear.  Stay with you until you recover. SEEK IMMEDIATE MEDICAL CARE IF:  The seizure lasts longer than 2 to 5 minutes.  The seizure is severe or the person does not wake up after the seizure.  The person has altered mental status. Drive the person to the emergency department or call your local emergency services (911 in [place] MAKE SURE YOU:  Understand these instructions.  Will watch your condition.  Will get help right away if you are not doing well or get worse. Document Released: 06/08/2000 Document Revised: 09/03/2011 Document Reviewed: 05/30/2011 Exit[employer] Patient Information 2014 [employer].  

## 2012-12-18 NOTE — Assessment & Plan Note (Signed)
Well controlled Will refill xanax today

## 2012-12-18 NOTE — Assessment & Plan Note (Signed)
Will recheck TSH in 6 months

## 2012-12-22 ENCOUNTER — Telehealth: Payer: Self-pay | Admitting: Cardiology

## 2012-12-22 NOTE — Telephone Encounter (Signed)
Returned call to [facility] at [facility] she stated patient is scheduled for a colonoscopy and endoscopy 01/09/13 with Dr.Bray wanted to know if ok for patient to hold plavix 4 to 5 days before procedures.Message sent to Dr.Jordan.

## 2012-12-22 NOTE — Telephone Encounter (Signed)
She can stop Plavix for 5 days for endoscopy procedures.  Peter Swaziland MD, [facility]

## 2012-12-22 NOTE — Telephone Encounter (Signed)
Shelia at [facility] called already left for the day, message was given Dr.Jordan advised ok to stop plavix 5 days before procedures.Note faxed to fax # [phone].

## 2012-12-22 NOTE — Telephone Encounter (Signed)
New problem    Checking on status of medication that need to be stop for procedures- plavix .

## 2013-01-09 DIAGNOSIS — K2990 Gastroduodenitis, unspecified, without bleeding: Secondary | ICD-10-CM | POA: Diagnosis not present

## 2013-01-09 DIAGNOSIS — K2960 Other gastritis without bleeding: Secondary | ICD-10-CM | POA: Diagnosis not present

## 2013-01-09 DIAGNOSIS — K219 Gastro-esophageal reflux disease without esophagitis: Secondary | ICD-10-CM | POA: Diagnosis not present

## 2013-01-09 DIAGNOSIS — Z860100 Personal history of colon polyps, unspecified: Secondary | ICD-10-CM | POA: Diagnosis not present

## 2013-01-09 DIAGNOSIS — Z7982 Long term (current) use of aspirin: Secondary | ICD-10-CM | POA: Diagnosis not present

## 2013-01-09 DIAGNOSIS — K2970 Gastritis, unspecified, without bleeding: Secondary | ICD-10-CM | POA: Diagnosis not present

## 2013-01-09 DIAGNOSIS — I10 Essential (primary) hypertension: Secondary | ICD-10-CM | POA: Diagnosis not present

## 2013-01-09 DIAGNOSIS — Q828 Other specified congenital malformations of skin: Secondary | ICD-10-CM | POA: Diagnosis not present

## 2013-01-09 DIAGNOSIS — R109 Unspecified abdominal pain: Secondary | ICD-10-CM | POA: Diagnosis not present

## 2013-01-09 DIAGNOSIS — I2510 Atherosclerotic heart disease of native coronary artery without angina pectoris: Secondary | ICD-10-CM | POA: Diagnosis not present

## 2013-01-09 DIAGNOSIS — Z7902 Long term (current) use of antithrombotics/antiplatelets: Secondary | ICD-10-CM | POA: Diagnosis not present

## 2013-01-09 DIAGNOSIS — Z79899 Other long term (current) drug therapy: Secondary | ICD-10-CM | POA: Diagnosis not present

## 2013-01-09 DIAGNOSIS — Z9861 Coronary angioplasty status: Secondary | ICD-10-CM | POA: Diagnosis not present

## 2013-01-09 DIAGNOSIS — K319 Disease of stomach and duodenum, unspecified: Secondary | ICD-10-CM | POA: Diagnosis not present

## 2013-01-09 DIAGNOSIS — I252 Old myocardial infarction: Secondary | ICD-10-CM | POA: Diagnosis not present

## 2013-01-09 DIAGNOSIS — Z885 Allergy status to narcotic agent status: Secondary | ICD-10-CM | POA: Diagnosis not present

## 2013-01-16 ENCOUNTER — Telehealth: Payer: Self-pay | Admitting: Internal Medicine

## 2013-01-16 DIAGNOSIS — M545 Low back pain, unspecified: Secondary | ICD-10-CM

## 2013-01-16 DIAGNOSIS — F419 Anxiety disorder, unspecified: Secondary | ICD-10-CM

## 2013-01-16 MED ORDER — ALPRAZOLAM 1 MG PO TABS: 1.0000 mg | ORAL_TABLET | Freq: Three times a day (TID) | ORAL | Status: DC | PRN

## 2013-01-16 MED ORDER — HYDROCODONE-ACETAMINOPHEN 10-325 MG PO TABS: 1.0000 | ORAL_TABLET | ORAL | Status: DC | PRN

## 2013-01-16 NOTE — Telephone Encounter (Signed)
Ok to refill both?? 

## 2013-01-16 NOTE — Telephone Encounter (Signed)
Pt request refill for Alprazolam and Hydrocodone. Pt stated that drug store fax request twice but never heard anything from our office. Pt stated that she will be out tomorrow and pt was wondering if she can get it done today. Please advise.

## 2013-01-16 NOTE — Telephone Encounter (Signed)
rx done, pt notified.

## 2013-01-19 ENCOUNTER — Encounter: Payer: Self-pay | Admitting: Cardiology

## 2013-01-19 ENCOUNTER — Ambulatory Visit (INDEPENDENT_AMBULATORY_CARE_PROVIDER_SITE_OTHER): Payer: Medicare Other | Admitting: Cardiology

## 2013-01-19 VITALS — BP 140/90 | HR 114 | Ht 60.0 in | Wt 150.4 lb

## 2013-01-19 DIAGNOSIS — I10 Essential (primary) hypertension: Secondary | ICD-10-CM | POA: Diagnosis not present

## 2013-01-19 DIAGNOSIS — I2510 Atherosclerotic heart disease of native coronary artery without angina pectoris: Secondary | ICD-10-CM

## 2013-01-19 DIAGNOSIS — F17200 Nicotine dependence, unspecified, uncomplicated: Secondary | ICD-10-CM

## 2013-01-19 DIAGNOSIS — E785 Hyperlipidemia, unspecified: Secondary | ICD-10-CM

## 2013-01-19 NOTE — Patient Instructions (Signed)
Stop smoking  Continue to use Ntg as needed.  Stay on your other medications  I will see you in 6 months.

## 2013-01-20 NOTE — Progress Notes (Signed)
[name] Date of Birth: October 03, 1955 Medical Record #[id number]  History of Present Illness: [name] is seen today for a followup visit. She had remote coronary bypass surgery in 2002. She had prior stenting of the vein graft to the obtuse marginal vessel in 2009. She then had a drug-eluting stent placement to this same saphenous vein graft to the ramus in March of 2012. This subsequently occluded and she underwent angioplasty and drug-eluting stent to the ramus intermediate branch in September of 2012. Cardiac catheterization again in October of 2012 showed nonobstructive disease. Since her last visit she underwent GI evaluation with Dr. Jason Flynn in [place] with upper and lower endoscopy. She reports she had gastritis and inflammatory bowel disease. He is planning to treat her with [facility] which is a new medication for IBD. She was treated for a UTI with Cipro last month. She states the medication made her sick. She feels she has a recurrent UTI with frequency and burning. She is planning to see her urologist. She does have intermittent chest pain that is relieved with sl Ntg. She was hospitalized in March with neurologic changes. CCT was normal. Ecg and labs were OK.   Current Outpatient Prescriptions on File Prior to Visit  Medication Sig Dispense Refill  . ALPRAZolam (XANAX) 1 MG tablet Take 1 tablet (1 mg total) by mouth 3 (three) times daily as needed for sleep.  90 tablet  0  . amLODipine (NORVASC) 5 MG tablet Take 1 tablet (5 mg total) by mouth every evening.  90 tablet  3  . clopidogrel (PLAVIX) 75 MG tablet Take 1 tablet (75 mg total) by mouth every evening.  90 tablet  3  . DEXILANT 60 MG capsule Take 60 mg by mouth daily.       Marland Kitchen HYDROcodone-acetaminophen (NORCO) 10-325 MG per tablet Take 1 tablet by mouth every 4 (four) hours as needed for pain.  180 tablet  0  . hydrOXYzine (VISTARIL) 25 MG capsule Take 25 mg by mouth daily as needed for itching.       . levothyroxine (SYNTHROID,  LEVOTHROID) 25 MCG tablet Take 1 tablet (25 mcg total) by mouth every evening.  90 tablet  1  . metoprolol succinate (TOPROL-XL) 50 MG 24 hr tablet Take 100 mg by mouth every evening. Take with or immediately following a meal.      . nitroGLYCERIN (NITROSTAT) 0.4 MG SL tablet Place 1 tablet (0.4 mg total) under the tongue every 5 (five) minutes as needed (up to 3 doses). For chest pain  25 tablet  4  . promethazine (PHENERGAN) 25 MG tablet Take 25 mg by mouth every 6 (six) hours as needed. For nausea      . rosuvastatin (CRESTOR) 20 MG tablet Take 20 mg by mouth every evening.       Current Facility-Administered Medications on File Prior to Visit  Medication Dose Route Frequency Provider Last Rate Last Dose  . bupivacaine (MARCAINE) 0.5 % 15 mL, phenazopyridine (PYRIDIUM) 400 mg bladder mixture   Bladder Instillation Once Christina Sinner, MD        Allergies  Allergen Reactions  . Morphine And Related Other (See Comments)    Flushing & "makes me feel weird; that sets off a panic attack" Pain meds lowers blood pressure  . Ranitidine Hives  . Sulfa Antibiotics Other (See Comments)    Childhood reaction    Past Medical History  Diagnosis Date  . Chest pain, cardiac     Has chronic chest  pain.   . Coronary artery disease     a) s/p CABG x 2 in [place] 2002. b)stenting to the SVG-OM in 2009. c)DES to the saphenous vein graft to the ramus in March 2012. d) s/p PTCA/DES to native ramus intermedius in September 2012. Last cath 03/2011 with minimal nonobstructive disease  e) indeterminant myoview 09/2011 but atypical CP & no perfusion defect in ramus intermediate distribution, EF 52%  . Hypertension   . Hyperlipidemia   . Tobacco abuse   . Hypothyroidism   . Hx: UTI (urinary tract infection)     multiple  . Enterocolitis   . Gastroparesis   . Complication of anesthesia     "my blood pressure will drop out on me"  . 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 Anemia    . Blood transfusion   . GERD (gastroesophageal reflux disease)   . Hematuria 09/25/11    "lately"  . GI bleeding   . Anxiety   . Depression   . PTSD (post-traumatic stress disorder)   . Migraines     h/o    Past Surgical History  Procedure Laterality Date  . Vesicovaginal fistula closure w/ tah  1983  . Tmj arthroplasty  1980's    right side; "I've had to have it twice"  . Coronary artery bypass graft  2002    CABG X2  . Coronary angioplasty with stent placement      "lots; they've all collapsed; last time dr took native vein &  turned it around & put stents in it before attaching"  . Abdominal hysterectomy  1980's  . Salpingoophorectomy  1980's    "2 surgeries; 1st right; then left"  . Biopsy stomach  09/25/11    "several in the last year; all negative; via endoscopy"  . Pilonidal cyst / sinus excision  1980's    "was wrapped around my spinal cord; Dr. Yvette Flynn got almost all of it except a tiny bit; said it was really deep; packed fatty tissue in the hole; really bothers me alot"  . Cholecystectomy  2000's  . Breast surgery      right biopsy  . Cysto with hydrodistension  03/28/2012    Procedure: CYSTOSCOPY/HYDRODISTENSION;  Surgeon: Christina Sinner, MD;  Location: [facility];  Service: Urology;  Laterality: N/A;  . Cystoscopy with injection  03/28/2012    Procedure: CYSTOSCOPY WITH INJECTION;  Surgeon: Christina Sinner, MD;  Location: [facility];  Service: Urology;  Laterality: N/A;  Marcaine and Pyridium  . Cystoscopy w/ retrogrades  03/28/2012    Procedure: CYSTOSCOPY WITH RETROGRADE PYELOGRAM;  Surgeon: Christina Sinner, MD;  Location: [facility];  Service: Urology;  Laterality: Bilateral;    History  Smoking status  . Current Every Day Smoker -- 0.50 packs/day for 15 years  . Types: Cigarettes  Smokeless tobacco  . Never Used    History  Alcohol Use No    Family History  Problem Relation Age of Onset  . Heart disease Father   . Emphysema Mother     smoked  .  Asthma Mother   . Heart disease Paternal Grandfather   . Breast cancer Mother     Review of Systems: The review of systems is  positive for chronic pain. She also has a history of anxiety and depression. She has frequent explosive diarrhea. All other systems were reviewed and are negative.  Physical Exam: BP 140/90  Pulse 114  Ht  5' (1.524 m)  Wt 150 lb 6.4 oz (68.221 kg)  BMI 29.37 kg/m2  SpO2 98% Patient is alert and in no acute distress. Skin is warm and dry. Color is normal.  HEENT is unremarkable. Normocephalic/atraumatic. PERRL. Sclera are nonicteric. Neck is supple. No masses. No JVD. Lungs are coarse. Cardiac exam shows a regular rate and rhythm. Abdomen is soft. Extremities are without edema. Gait and ROM are intact. No gross neurologic deficits noted.  LABORATORY DATA:   Lab Results  Component Value Date   WBC 9.1 09/05/2012   HGB 16.1* 09/05/2012   HCT 44.9 09/05/2012   PLT 228 09/05/2012   GLUCOSE 111* 09/05/2012   CHOL 152 06/19/2012   TRIG 185.0* 06/19/2012   HDL 46.40 06/19/2012   LDLCALC 69 06/19/2012   ALT 20 09/05/2012   AST 18 09/05/2012   NA 139 09/05/2012   K 3.7 09/05/2012   CL 101 09/05/2012   CREATININE 0.57 09/05/2012   BUN 9 09/05/2012   CO2 22 09/05/2012   TSH 3.37 06/19/2012   INR 1.07 09/25/2011   HGBA1C 5.7* 03/08/2011     Assessment / Plan: 1. Coronary disease with previous bypass and multiple interventions as noted above. Symptomatically she is doing quite well. Because of her hematuria we had stopped her aspirin. I would recommend that she remain on Plavix indefinitely. I will followup again in 6 months. Continue to use sl Ntg as needed. I am not sure her chest pain is all angina. Some of her symptoms may be related to her GI issues.  2. Hypertension, controlled.  3. Tobacco abuse. I've counseled her on the need for smoking cessation. She has cut down.  4. Hyperlipidemia.  5. Inflammatory bowel disease.  6. Gastritis.

## 2013-02-16 ENCOUNTER — Telehealth: Payer: Self-pay | Admitting: *Deleted

## 2013-02-16 NOTE — Telephone Encounter (Signed)
Pt called requesting a refill on Hydrocodone and Alprazolam.  Please advise

## 2013-02-16 NOTE — Telephone Encounter (Signed)
Ok to refill both meds, please but 2 refills on both as well

## 2013-02-17 ENCOUNTER — Other Ambulatory Visit: Payer: Self-pay | Admitting: *Deleted

## 2013-02-17 DIAGNOSIS — M545 Low back pain, unspecified: Secondary | ICD-10-CM

## 2013-02-17 DIAGNOSIS — F419 Anxiety disorder, unspecified: Secondary | ICD-10-CM

## 2013-02-17 MED ORDER — HYDROCODONE-ACETAMINOPHEN 10-325 MG PO TABS: 1.0000 | ORAL_TABLET | ORAL | Status: DC | PRN

## 2013-02-17 MED ORDER — ALPRAZOLAM 1 MG PO TABS: 1.0000 mg | ORAL_TABLET | Freq: Three times a day (TID) | ORAL | Status: DC | PRN

## 2013-02-17 NOTE — Telephone Encounter (Signed)
Pt aware of Refill

## 2013-03-16 ENCOUNTER — Other Ambulatory Visit: Payer: Self-pay

## 2013-03-16 DIAGNOSIS — I10 Essential (primary) hypertension: Secondary | ICD-10-CM

## 2013-03-16 MED ORDER — METOPROLOL SUCCINATE ER 50 MG PO TB24: 100.0000 mg | ORAL_TABLET | Freq: Every evening | ORAL | Status: DC

## 2013-03-16 NOTE — Telephone Encounter (Signed)
Refilled metoprolol to [facility]...ds,cma

## 2013-04-13 ENCOUNTER — Telehealth: Payer: Self-pay | Admitting: Internal Medicine

## 2013-04-13 NOTE — Telephone Encounter (Signed)
Patient is requesting a refill on Norco, call when ready

## 2013-04-13 NOTE — Telephone Encounter (Signed)
She is not due for a refill until 11/26. No refills will be given until then

## 2013-04-14 NOTE — Telephone Encounter (Signed)
Patient called concerning Norco refill. Patient states that last month the medication was filled on the 21 and she is out.

## 2013-04-15 NOTE — Telephone Encounter (Signed)
On 8/26 she was given an rx for Norco with 3 additional refills. This should have lasted until 11/26. No early refills

## 2013-04-15 NOTE — Telephone Encounter (Signed)
Patient informed. 

## 2013-04-20 ENCOUNTER — Telehealth: Payer: Self-pay

## 2013-04-20 NOTE — Telephone Encounter (Signed)
Patient was unavailable left voicemail and instructed patient that no refills were going to be given until 11/26.

## 2013-04-20 NOTE — Telephone Encounter (Signed)
She should have enough until 11/26. No refills until that time

## 2013-04-20 NOTE — Telephone Encounter (Signed)
Received call from patient requesting refill on Xanax and Hydrocodone. Both medications were filled on 02/17/2013 and patient was last seen on 01/16/2013.   Please advise,  Thanks!

## 2013-04-22 ENCOUNTER — Telehealth: Payer: Self-pay | Admitting: Internal Medicine

## 2013-04-22 NOTE — Telephone Encounter (Signed)
04/22/2013  Pt called in regards to the RX refills for HYDROcodone-acetaminophen (NORCO) 10-325 MG again.  She is stating that she can not get the refills and is getting upset.  Dropped off paperwork from pharmacy stating that they did not and will not refill the RX without a paper copy.

## 2013-04-22 NOTE — Telephone Encounter (Signed)
Spoke with patient and notified her of the new law that she has to take a hard rx to the pharmacy for hydrocodone even if it already had refills on it. I instructed her that she could come and pick up a rx from the office tomorrow 10/30.

## 2013-04-23 ENCOUNTER — Other Ambulatory Visit: Payer: Self-pay

## 2013-04-23 DIAGNOSIS — M545 Low back pain, unspecified: Secondary | ICD-10-CM

## 2013-04-23 MED ORDER — HYDROCODONE-ACETAMINOPHEN 10-325 MG PO TABS: 1.0000 | ORAL_TABLET | ORAL | Status: DC | PRN

## 2013-04-23 NOTE — Telephone Encounter (Signed)
noted 

## 2013-04-30 ENCOUNTER — Other Ambulatory Visit: Payer: Self-pay

## 2013-05-06 ENCOUNTER — Telehealth: Payer: Self-pay

## 2013-05-06 NOTE — Telephone Encounter (Signed)
Pt wanted [facility] address pt has already scheduled appt Nicki Reaper NP 07/06/13. Address given and pt voiced understanding.

## 2013-05-08 ENCOUNTER — Other Ambulatory Visit: Payer: Self-pay | Admitting: *Deleted

## 2013-05-08 DIAGNOSIS — F419 Anxiety disorder, unspecified: Secondary | ICD-10-CM

## 2013-05-08 MED ORDER — ALPRAZOLAM 1 MG PO TABS: 1.0000 mg | ORAL_TABLET | Freq: Three times a day (TID) | ORAL | Status: DC | PRN

## 2013-05-08 NOTE — Telephone Encounter (Signed)
Ok to refill 

## 2013-05-08 NOTE — Telephone Encounter (Signed)
Ok to phone in.

## 2013-05-11 NOTE — Telephone Encounter (Signed)
Rx called in as directed.   

## 2013-05-18 ENCOUNTER — Other Ambulatory Visit: Payer: Self-pay | Admitting: *Deleted

## 2013-05-18 DIAGNOSIS — M545 Low back pain, unspecified: Secondary | ICD-10-CM

## 2013-05-18 NOTE — Telephone Encounter (Signed)
Pt calling early for rx due to holidays. Request a call back when ready to pick up.

## 2013-05-25 MED ORDER — HYDROCODONE-ACETAMINOPHEN 10-325 MG PO TABS: 1.0000 | ORAL_TABLET | ORAL | Status: DC | PRN

## 2013-05-25 NOTE — Telephone Encounter (Signed)
Tried to call [name] to let her know her prescription is ready to be picked up at front desk.  Received message on her phone that states this subscriber does not accept incoming calls.  Prescription placed up front.  Will await patient to call back about her prescription.

## 2013-05-26 NOTE — Telephone Encounter (Signed)
Spoke with pt and advised rx at front desk for pick up; Christina and [name], pts sons will be by to pick up rx today. Pt understands in future pt will need to bring photo ID and pick up rx. Pt voiced understanding.

## 2013-06-03 ENCOUNTER — Observation Stay (HOSPITAL_COMMUNITY)
Admission: EM | Admit: 2013-06-03 | Discharge: 2013-06-04 | Disposition: A | Discharge status: Home / Self Care | Payer: Medicare Other | Attending: Internal Medicine | Admitting: Internal Medicine

## 2013-06-03 ENCOUNTER — Emergency Department (HOSPITAL_COMMUNITY): Payer: Medicare Other

## 2013-06-03 ENCOUNTER — Encounter (HOSPITAL_COMMUNITY): Payer: Self-pay | Admitting: Emergency Medicine

## 2013-06-03 ENCOUNTER — Other Ambulatory Visit: Payer: Self-pay

## 2013-06-03 ENCOUNTER — Telehealth: Payer: Self-pay | Admitting: Internal Medicine

## 2013-06-03 DIAGNOSIS — R0602 Shortness of breath: Secondary | ICD-10-CM | POA: Diagnosis not present

## 2013-06-03 DIAGNOSIS — I2510 Atherosclerotic heart disease of native coronary artery without angina pectoris: Secondary | ICD-10-CM | POA: Diagnosis not present

## 2013-06-03 DIAGNOSIS — F17200 Nicotine dependence, unspecified, uncomplicated: Secondary | ICD-10-CM | POA: Insufficient documentation

## 2013-06-03 DIAGNOSIS — Z951 Presence of aortocoronary bypass graft: Secondary | ICD-10-CM | POA: Insufficient documentation

## 2013-06-03 DIAGNOSIS — J209 Acute bronchitis, unspecified: Secondary | ICD-10-CM

## 2013-06-03 DIAGNOSIS — K529 Noninfective gastroenteritis and colitis, unspecified: Secondary | ICD-10-CM

## 2013-06-03 DIAGNOSIS — F411 Generalized anxiety disorder: Secondary | ICD-10-CM | POA: Insufficient documentation

## 2013-06-03 DIAGNOSIS — F1210 Cannabis abuse, uncomplicated: Secondary | ICD-10-CM | POA: Insufficient documentation

## 2013-06-03 DIAGNOSIS — F329 Major depressive disorder, single episode, unspecified: Secondary | ICD-10-CM | POA: Insufficient documentation

## 2013-06-03 DIAGNOSIS — I10 Essential (primary) hypertension: Secondary | ICD-10-CM | POA: Insufficient documentation

## 2013-06-03 DIAGNOSIS — K5289 Other specified noninfective gastroenteritis and colitis: Secondary | ICD-10-CM | POA: Diagnosis not present

## 2013-06-03 DIAGNOSIS — F3289 Other specified depressive episodes: Secondary | ICD-10-CM | POA: Insufficient documentation

## 2013-06-03 DIAGNOSIS — R079 Chest pain, unspecified: Secondary | ICD-10-CM | POA: Diagnosis not present

## 2013-06-03 DIAGNOSIS — E785 Hyperlipidemia, unspecified: Secondary | ICD-10-CM | POA: Insufficient documentation

## 2013-06-03 DIAGNOSIS — J441 Chronic obstructive pulmonary disease with (acute) exacerbation: Principal | ICD-10-CM | POA: Insufficient documentation

## 2013-06-03 LAB — CBC
MCH: 32.3 pg (ref 26.0–34.0)
MCHC: 35.3 g/dL (ref 30.0–36.0)
MCV: 91.6 fL (ref 78.0–100.0)
Platelets: 216 10*3/uL (ref 150–400)
RBC: 4.86 MIL/uL (ref 3.87–5.11)

## 2013-06-03 LAB — POCT I-STAT TROPONIN I: Troponin i, poc: 0.01 ng/mL (ref 0.00–0.08)

## 2013-06-03 LAB — BASIC METABOLIC PANEL
CO2: 23 mEq/L (ref 19–32)
Calcium: 9.5 mg/dL (ref 8.4–10.5)
Creatinine, Ser: 0.56 mg/dL (ref 0.50–1.10)
GFR calc Af Amer: >90 mL/min (ref >90)
GFR calc non Af Amer: >90 mL/min (ref >90)
Glucose, Bld: 106 mg/dL — ABNORMAL HIGH (ref 70–99)
Sodium: 138 mEq/L (ref 135–145)

## 2013-06-03 MED ORDER — IPRATROPIUM BROMIDE 0.02 % IN SOLN
0.5000 mg | RESPIRATORY_TRACT | Status: DC
  Administered 2013-06-03 (×2): 0.5 mg via RESPIRATORY_TRACT
  Filled 2013-06-03 (×2): qty 2.5

## 2013-06-03 MED ORDER — AMLODIPINE BESYLATE 5 MG PO TABS
5.0000 mg | ORAL_TABLET | Freq: Every evening | ORAL | Status: DC
  Administered 2013-06-04: 5 mg via ORAL
  Filled 2013-06-03 (×2): qty 1

## 2013-06-03 MED ORDER — ALBUTEROL SULFATE (5 MG/ML) 0.5% IN NEBU
2.5000 mg | INHALATION_SOLUTION | RESPIRATORY_TRACT | Status: DC
  Administered 2013-06-03 (×2): 2.5 mg via RESPIRATORY_TRACT
  Filled 2013-06-03 (×2): qty 0.5

## 2013-06-03 MED ORDER — ACETAMINOPHEN 500 MG PO TABS
1000.0000 mg | ORAL_TABLET | Freq: Once | ORAL | Status: AC
  Administered 2013-06-03: 1000 mg via ORAL
  Filled 2013-06-03: qty 2

## 2013-06-03 MED ORDER — PREDNISONE 20 MG PO TABS
40.0000 mg | ORAL_TABLET | Freq: Every day | ORAL | Status: DC
  Administered 2013-06-04: 40 mg via ORAL
  Filled 2013-06-03 (×2): qty 2

## 2013-06-03 MED ORDER — ALBUTEROL SULFATE (5 MG/ML) 0.5% IN NEBU
2.5000 mg | INHALATION_SOLUTION | RESPIRATORY_TRACT | Status: DC
  Administered 2013-06-03 – 2013-06-04 (×3): 2.5 mg via RESPIRATORY_TRACT
  Filled 2013-06-03 (×3): qty 0.5

## 2013-06-03 MED ORDER — IPRATROPIUM BROMIDE 0.02 % IN SOLN
0.5000 mg | RESPIRATORY_TRACT | Status: DC
  Administered 2013-06-03 – 2013-06-04 (×3): 0.5 mg via RESPIRATORY_TRACT
  Filled 2013-06-03 (×3): qty 2.5

## 2013-06-03 MED ORDER — IPRATROPIUM BROMIDE 0.02 % IN SOLN
RESPIRATORY_TRACT | Status: AC
  Filled 2013-06-03: qty 2.5

## 2013-06-03 MED ORDER — PANTOPRAZOLE SODIUM 40 MG PO TBEC
40.0000 mg | DELAYED_RELEASE_TABLET | Freq: Every day | ORAL | Status: DC
  Administered 2013-06-03 – 2013-06-04 (×2): 40 mg via ORAL
  Filled 2013-06-03 (×2): qty 1

## 2013-06-03 MED ORDER — HYDROCODONE-ACETAMINOPHEN 10-325 MG PO TABS
1.0000 | ORAL_TABLET | ORAL | Status: DC | PRN
  Administered 2013-06-04: 1 via ORAL
  Filled 2013-06-03: qty 1

## 2013-06-03 MED ORDER — ALPRAZOLAM 0.5 MG PO TABS
1.0000 mg | ORAL_TABLET | Freq: Three times a day (TID) | ORAL | Status: DC | PRN
  Administered 2013-06-03: 1 mg via ORAL
  Filled 2013-06-03: qty 2

## 2013-06-03 MED ORDER — METOPROLOL SUCCINATE ER 50 MG PO TB24
50.0000 mg | ORAL_TABLET | Freq: Every day | ORAL | Status: DC
  Administered 2013-06-04: 50 mg via ORAL
  Filled 2013-06-03 (×2): qty 1

## 2013-06-03 MED ORDER — PROMETHAZINE HCL 25 MG PO TABS
25.0000 mg | ORAL_TABLET | Freq: Four times a day (QID) | ORAL | Status: DC | PRN
  Administered 2013-06-03 – 2013-06-04 (×2): 25 mg via ORAL
  Filled 2013-06-03 (×3): qty 1

## 2013-06-03 MED ORDER — ALBUTEROL SULFATE (5 MG/ML) 0.5% IN NEBU
INHALATION_SOLUTION | RESPIRATORY_TRACT | Status: AC
  Filled 2013-06-03: qty 1

## 2013-06-03 MED ORDER — ALBUTEROL SULFATE (5 MG/ML) 0.5% IN NEBU: 2.5000 mg | INHALATION_SOLUTION | RESPIRATORY_TRACT | Status: DC

## 2013-06-03 MED ORDER — ALBUTEROL SULFATE (5 MG/ML) 0.5% IN NEBU: 2.5000 mg | INHALATION_SOLUTION | RESPIRATORY_TRACT | Status: DC | PRN

## 2013-06-03 MED ORDER — METHYLPREDNISOLONE SODIUM SUCC 125 MG IJ SOLR
125.0000 mg | Freq: Once | INTRAMUSCULAR | Status: AC
  Administered 2013-06-03: 125 mg via INTRAVENOUS
  Filled 2013-06-03: qty 2

## 2013-06-03 MED ORDER — BENZONATATE 100 MG PO CAPS
100.0000 mg | ORAL_CAPSULE | Freq: Once | ORAL | Status: AC
  Administered 2013-06-03: 100 mg via ORAL
  Filled 2013-06-03: qty 1

## 2013-06-03 MED ORDER — ALBUTEROL SULFATE (5 MG/ML) 0.5% IN NEBU
5.0000 mg | INHALATION_SOLUTION | Freq: Once | RESPIRATORY_TRACT | Status: AC
  Administered 2013-06-03: 5 mg via RESPIRATORY_TRACT

## 2013-06-03 MED ORDER — IPRATROPIUM BROMIDE 0.02 % IN SOLN
0.5000 mg | RESPIRATORY_TRACT | Status: DC
  Administered 2013-06-03: 0.5 mg via RESPIRATORY_TRACT
  Filled 2013-06-03: qty 2.5

## 2013-06-03 MED ORDER — CLOPIDOGREL BISULFATE 75 MG PO TABS
75.0000 mg | ORAL_TABLET | Freq: Every evening | ORAL | Status: DC
  Administered 2013-06-04: 75 mg via ORAL
  Filled 2013-06-03 (×2): qty 1

## 2013-06-03 MED ORDER — LEVOTHYROXINE SODIUM 25 MCG PO TABS
25.0000 ug | ORAL_TABLET | Freq: Every day | ORAL | Status: DC
Start: 2013-06-04 — End: 2013-06-04
  Administered 2013-06-04: 25 ug via ORAL
  Filled 2013-06-03 (×2): qty 1

## 2013-06-03 MED ORDER — NITROGLYCERIN 0.4 MG SL SUBL: 0.4000 mg | SUBLINGUAL_TABLET | SUBLINGUAL | Status: DC | PRN

## 2013-06-03 MED ORDER — ATORVASTATIN CALCIUM 40 MG PO TABS
40.0000 mg | ORAL_TABLET | Freq: Every day | ORAL | Status: DC
  Administered 2013-06-04: 40 mg via ORAL
  Filled 2013-06-03 (×2): qty 1

## 2013-06-03 NOTE — Telephone Encounter (Signed)
ED eval appropriate

## 2013-06-03 NOTE — Telephone Encounter (Signed)
.  left message to have patient return my call.  

## 2013-06-03 NOTE — ED Notes (Signed)
Presents with SOB, epigastric pain that began Saturday. Reports recent URI and fevers, on Saturday began to have wheezing, sob with exertion and lying down and productive cough with yellow sputum.  Epigastric pain described as soreness. Bilateral inspiratory wheezes with diminished bilateral lower breath sounds. +smoker. Sats 94% RA, HX of PE and CAD.

## 2013-06-03 NOTE — Telephone Encounter (Signed)
Patient Information:  Caller Name: Yilia  Phone: [phone]  Patient: [name], [name]  Gender: Female  DOB: [birth year]  Age: 57 Years  PCP: Nicki Reaper  Pregnant: No  Office Follow Up:  Does the office need to follow up with this patient?: Yes  Instructions For The Office: Has ED disposition per cough protocol due to difficulty breathing. No appt left in EPIC. Please call Murray regarding possible work in appointment.   Symptoms  Reason For Call & Symptoms: Klaudia states she had onset of cough swollen glands in neck on 05/29/2013. Onset of nonproductive cough on 12/07. States she had onset of rib pain on 12/09 due to coughing. States she sounded like she "was gurgling last night when she lays down which continues on 12/10. Has to stay very still and sit up or she has difficulty breathing. Oral Temp 100.4 @ 0800. Has yellow nasal drainage onset 12/ 20/2014. Per cough protocol has ED disposition due to difficulty breathing. No appointments available in EPIC. Please call Brityn [phone]  regarding possible work in appointment on 12/10 or ED disposition.  Reviewed Health History In EMR: Yes  Reviewed Medications In EMR: Yes  Reviewed Allergies In EMR: Yes  Reviewed Surgeries / Procedures: Yes  Date of Onset of Symptoms: 05/30/2013  Treatments Tried: Nyquil, Alka Seltzer cold tabs, VapoRub  Treatments Tried Worked: No  Any Fever: Yes  Fever Taken: Oral  Fever Time Of Reading: 00:00:00  Fever Last Reading: 100.4 OB / GYN:  LMP: Unknown  Guideline(s) Used:  Cough  Disposition Per Guideline:   Go to ED Now  Reason For Disposition Reached:   Difficulty breathing  Advice Given:  N/A  Patient Will Follow Care Advice:  YES

## 2013-06-03 NOTE — Progress Notes (Signed)
Attempted call to get report. Nurse to call back is in with a stroke pt.

## 2013-06-03 NOTE — Progress Notes (Signed)
Pt arrived from ED via stretcher. Pt in no apparent distress and VSS. Pt was placed in bed. Pt had viewed safety video. Pt has no requests at this time. RN will continue to monitor and assist pt.

## 2013-06-03 NOTE — ED Provider Notes (Signed)
CSN: [id number]     Arrival date & time 06/03/13  1616 History   First MD Initiated Contact with Patient 06/03/13 1634     Chief Complaint  Patient presents with  . Shortness of Breath   (Consider location/radiation/quality/duration/timing/severity/associated sxs/prior Treatment) Patient is a 57 y.o. female presenting with shortness of breath.  Shortness of Breath Severity:  Moderate Onset quality:  Gradual Duration:  3 days Timing:  Constant Progression:  Worsening Chronicity:  New Context: activity and URI (patient noted to have URI prior to onset of shortness of breath. URI symptoms included cough, sore throat, cervical adenopathy)   Relieved by: Received breathing treatment on arrival with some relief of shortness breath. Worsened by:  Activity and coughing Associated symptoms: cough, sore throat and swollen glands   Associated symptoms: no abdominal pain, no chest pain, no fever, no headaches and no rash     Chief complaint: Shortness of breath  Patient is a 57 year old female past medical history significant for CAD, GERD who comes in with chief complaint of shortness of breath. Patient states she's had 3 days worth of wheezing and shortness of breath. States this is moderate in severity. Has been worsening for 3 days. Patient is a smoker. No treatments at home. Patient states she had symptoms consistent with URI prior to onset of shortness of breath. This included cervical adenopathy, cough, sore throat. Patient also complaining of epigastric pain. Patient states when she lays down she also notices gurgling in the back of her throat. Similar to previous GERD symptoms. Worse when lying down. Has been taking Maalox with some relief of symptoms. Patient denies any chest pain. Past Medical History  Diagnosis Date  . Chest pain, cardiac     Has chronic chest pain.   . Coronary artery disease     a) s/p CABG x 2 in [place] 2002. b)stenting to the SVG-OM in 2009. c)DES to the  saphenous vein graft to the ramus in March 2012. d) s/p PTCA/DES to native ramus intermedius in September 2012. Last cath 03/2011 with minimal nonobstructive disease  e) indeterminant myoview 09/2011 but atypical CP & no perfusion defect in ramus intermediate distribution, EF 52%  . Hypertension   . Hyperlipidemia   . Tobacco abuse   . Hypothyroidism   . Hx: UTI (urinary tract infection)     multiple  . Enterocolitis   . Gastroparesis   . Complication of anesthesia     "my blood pressure will drop out on me"  . 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 Anemia   . Blood transfusion   . GERD (gastroesophageal reflux disease)   . Hematuria 09/25/11    "lately"  . GI bleeding   . Anxiety   . Depression   . PTSD (Travanti Mcmanus-traumatic stress disorder)   . Migraines     h/o   Past Surgical History  Procedure Laterality Date  . Vesicovaginal fistula closure w/ tah  1983  . Tmj arthroplasty  1980's    right side; "I've had to have it twice"  . Coronary artery bypass graft  2002    CABG X2  . Coronary angioplasty with stent placement      "lots; they've all collapsed; last time dr took native vein &  turned it around & put stents in it before attaching"  . Abdominal hysterectomy  1980's  . Salpingoophorectomy  1980's    "2 surgeries; 1st right; then left"  . Biopsy stomach  09/25/11    "several in the last year; all negative; via endoscopy"  . Pilonidal cyst / sinus excision  1980's    "was wrapped around my spinal cord; Dr. Yvette Rack got almost all of it except a tiny bit; said it was really deep; packed fatty tissue in the hole; really bothers me alot"  . Cholecystectomy  2000's  . Breast surgery      right biopsy  . Cysto with hydrodistension  03/28/2012    Procedure: CYSTOSCOPY/HYDRODISTENSION;  Surgeon: Martina Sinner, MD;  Location: [facility];  Service: Urology;  Laterality: N/A;  . Cystoscopy with injection  03/28/2012    Procedure: CYSTOSCOPY WITH INJECTION;   Surgeon: Martina Sinner, MD;  Location: [facility];  Service: Urology;  Laterality: N/A;  Marcaine and Pyridium  . Cystoscopy w/ retrogrades  03/28/2012    Procedure: CYSTOSCOPY WITH RETROGRADE PYELOGRAM;  Surgeon: Martina Sinner, MD;  Location: [facility];  Service: Urology;  Laterality: Bilateral;   Family History  Problem Relation Age of Onset  . Heart disease Father   . Emphysema Mother     smoked  . Asthma Mother   . Heart disease Paternal Grandfather   . Breast cancer Mother    History  Substance Use Topics  . Smoking status: Current Every Day Smoker -- 0.50 packs/day for 15 years    Types: Cigarettes  . Smokeless tobacco: Never Used  . Alcohol Use: No   OB History   Grav Para Term Preterm Abortions TAB SAB Ect Mult Living                 Review of Systems  Constitutional: Negative for fever and fatigue.  HENT: Positive for sore throat.   Respiratory: Positive for cough and shortness of breath.   Cardiovascular: Negative for chest pain, palpitations and leg swelling.  Gastrointestinal: Negative for abdominal pain.  Genitourinary: Negative for dysuria.  Skin: Negative for rash.  Neurological: Negative for dizziness and headaches.  All other systems reviewed and are negative.    Allergies  Morphine and related; Ranitidine; and Sulfa antibiotics  Home Medications   No current outpatient prescriptions on file. BP 130/75  Pulse 83  Temp(Src) 98.1 F (36.7 C) (Oral)  Resp 22  SpO2 91% Physical Exam  Nursing note and vitals reviewed. Constitutional: She is oriented to person, place, and time. She appears well-developed and well-nourished.  HENT:  Head: Normocephalic and atraumatic.  Eyes: EOM are normal. Pupils are equal, round, and reactive to light.  Neck: Normal range of motion.  No tonsillar exudates or erythema  Cardiovascular: Normal rate, regular rhythm and intact distal pulses.   Pulmonary/Chest: Effort normal. No respiratory distress. She has wheezes.  She exhibits no tenderness.  Status Alonzo Owczarzak breathing treatment still with end expiratory wheezing. Satting 99% on room air. Subjective improvement in shortness of breath.  Abdominal: Soft. She exhibits no distension. There is no tenderness. There is no rebound and no guarding.  Musculoskeletal: Normal range of motion. She exhibits no tenderness.  Neurological: She is alert and oriented to person, place, and time. No cranial nerve deficit. She exhibits normal muscle tone. Coordination normal.  Skin: Skin is warm and dry.  Psychiatric: She has a normal mood and affect. Her behavior is normal. Judgment and thought content normal.    ED Course  Procedures (including critical care time) Labs Review Labs Reviewed  CBC - Abnormal; Notable for the following:    Hemoglobin 15.7 (*)    All other  components within normal limits  BASIC METABOLIC PANEL - Abnormal; Notable for the following:    Potassium 3.4 (*)    Glucose, Bld 106 (*)    All other components within normal limits  CBC  BASIC METABOLIC PANEL  POCT I-STAT TROPONIN I   Imaging Review Dg Chest 2 View (if Patient Has Fever And/or Copd)  06/03/2013   CLINICAL DATA:  Chest pain  EXAM: CHEST  2 VIEW  COMPARISON:  09/05/2012  FINDINGS: Cardiomediastinal silhouette is stable. Status Maeghan Canny median sternotomy. No acute infiltrate or pleural effusion. No pulmonary edema. Mild degenerative changes thoracic spine.  IMPRESSION: No active cardiopulmonary disease.   Electronically Signed   By: Natasha Mead M.D.   On: 06/03/2013 16:53    EKG Interpretation    Date/Time:    Ventricular Rate:    PR Interval:    QRS Duration:   QT Interval:    QTC Calculation:   R Axis:     Text Interpretation:              MDM   1. Acute bronchitis     On arrival patient with shortness of breath. On exam patient was course breath sounds bilaterally and wheezing throughout. Oxygen saturations between 90 and 94%. Patient denies fevers at home. Patient is  a smoker with 20+ pack year history. No confirm diagnosis of COPD. Patient received DuoNeb with good relief of symptoms. Also received Solu-Medrol. On reexam patient initially states shortness of breath improved with breathing treatments however had begun to feel short of breath again. Patient received multiple breathing treatments with same result. Chest x-ray showed no signs of pneumonia. CBC CMP unremarkable. Patient does have an extensive cardiac history however at this time is not complaining of any chest pain. EKG showed no changes. Cardiac enzymes negative. No need cardiac workup. Secondary to continue needed for breathing treatments medicine was consult and has admitted to observation.   Bridgett Larsson, MD 06/03/13 [phone]

## 2013-06-03 NOTE — ED Notes (Signed)
Patient up to the bathroom without difficulty.

## 2013-06-03 NOTE — H&P (Signed)
[facility] History and Physical  [name] ZOX:[id number] DOB: [birth year] DOA: 06/03/2013  Referring physician: EDP PCP: Nicki Reaper, NP   Chief Complaint: SOB   HPI: [name] is a 57 y.o. female who presents to the ED with c/o SOB, cough, congestion.  Symptoms onset suddenly 3 days ago and have been progressively worsening since onset.  Relieved here in ED temporarily with breathing treatments.  History positive for sick contact with identical symptoms in the past week.  Review of Systems: Systems reviewed.  As above, otherwise negative  Past Medical History  Diagnosis Date  . Chest pain, cardiac     Has chronic chest pain.   . Coronary artery disease     a) s/p CABG x 2 in [place] 2002. b)stenting to the SVG-OM in 2009. c)DES to the saphenous vein graft to the ramus in March 2012. d) s/p PTCA/DES to native ramus intermedius in September 2012. Last cath 03/2011 with minimal nonobstructive disease  e) indeterminant myoview 09/2011 but atypical CP & no perfusion defect in ramus intermediate distribution, EF 52%  . Hypertension   . Hyperlipidemia   . Tobacco abuse   . Hypothyroidism   . Hx: UTI (urinary tract infection)     multiple  . Enterocolitis   . Gastroparesis   . Complication of anesthesia     "my blood pressure will drop out on me"  . 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 Anemia   . Blood transfusion   . GERD (gastroesophageal reflux disease)   . Hematuria 09/25/11    "lately"  . GI bleeding   . Anxiety   . Depression   . PTSD (post-traumatic stress disorder)   . Migraines     h/o   Past Surgical History  Procedure Laterality Date  . Vesicovaginal fistula closure w/ tah  1983  . Tmj arthroplasty  1980's    right side; "I've had to have it twice"  . Coronary artery bypass graft  2002    CABG X2  . Coronary angioplasty with stent placement      "lots; they've all collapsed; last time dr took native vein &  turned it around  & put stents in it before attaching"  . Abdominal hysterectomy  1980's  . Salpingoophorectomy  1980's    "2 surgeries; 1st right; then left"  . Biopsy stomach  09/25/11    "several in the last year; all negative; via endoscopy"  . Pilonidal cyst / sinus excision  1980's    "was wrapped around my spinal cord; Dr. Yvette Rack got almost all of it except a tiny bit; said it was really deep; packed fatty tissue in the hole; really bothers me alot"  . Cholecystectomy  2000's  . Breast surgery      right biopsy  . Cysto with hydrodistension  03/28/2012    Procedure: CYSTOSCOPY/HYDRODISTENSION;  Surgeon: Martina Sinner, MD;  Location: [facility];  Service: Urology;  Laterality: N/A;  . Cystoscopy with injection  03/28/2012    Procedure: CYSTOSCOPY WITH INJECTION;  Surgeon: Martina Sinner, MD;  Location: [facility];  Service: Urology;  Laterality: N/A;  Marcaine and Pyridium  . Cystoscopy w/ retrogrades  03/28/2012    Procedure: CYSTOSCOPY WITH RETROGRADE PYELOGRAM;  Surgeon: Martina Sinner, MD;  Location: [facility];  Service: Urology;  Laterality: Bilateral;   Social History:  reports that she has been smoking Cigarettes.  She has a 7.5 pack-year smoking history.  She has never used smokeless tobacco. She reports that she uses illicit drugs (Marijuana). She reports that she does not drink alcohol.  Allergies  Allergen Reactions  . Morphine And Related Other (See Comments)    Flushing & "makes me feel weird; that sets off a panic attack" Pain meds lowers blood pressure  . Ranitidine Hives  . Sulfa Antibiotics Other (See Comments)    Childhood reaction    Family History  Problem Relation Age of Onset  . Heart disease Father   . Emphysema Mother     smoked  . Asthma Mother   . Heart disease Paternal Grandfather   . Breast cancer Mother      Prior to Admission medications   Medication Sig Start Date End Date Taking? Authorizing Provider  ALPRAZolam Prudy Feeler) 1 MG tablet Take 1 tablet (1 mg  total) by mouth 3 (three) times daily as needed for sleep. 05/08/13  Yes Nicki Reaper, NP  amLODipine (NORVASC) 5 MG tablet Take 1 tablet (5 mg total) by mouth every evening. 06/04/12  Yes Peter M [place], MD  clopidogrel (PLAVIX) 75 MG tablet Take 1 tablet (75 mg total) by mouth every evening. 06/04/12  Yes Peter M [place], MD  DEXILANT 60 MG capsule Take 60 mg by mouth daily.  11/20/12  Yes Historical Provider, MD  HYDROcodone-acetaminophen (NORCO) 10-325 MG per tablet Take 1 tablet by mouth every 4 (four) hours as needed. 05/18/13  Yes Spencer Copland, MD  levothyroxine (SYNTHROID, LEVOTHROID) 25 MCG tablet Take 1 tablet (25 mcg total) by mouth every evening. 06/19/12  Yes Nicki Reaper, NP  metoprolol succinate (TOPROL-XL) 50 MG 24 hr tablet Take 50 mg by mouth daily. Take with or immediately following a meal.   Yes Historical Provider, MD  promethazine (PHENERGAN) 25 MG tablet Take 25 mg by mouth every 6 (six) hours as needed. For nausea 06/07/11  Yes Historical Provider, MD  rosuvastatin (CRESTOR) 20 MG tablet Take 20 mg by mouth every evening.   Yes Historical Provider, MD  metoprolol succinate (TOPROL-XL) 50 MG 24 hr tablet Take by mouth every evening. Take with or immediately following a meal. 03/16/13 06/03/13  Nicki Reaper, NP  nitroGLYCERIN (NITROSTAT) 0.4 MG SL tablet Place 1 tablet (0.4 mg total) under the tongue every 5 (five) minutes as needed (up to 3 doses). For chest pain 06/04/12   Peter M [place], MD   Physical Exam: Filed Vitals:   06/03/13 2146  BP: 124/76  Pulse: 88  Temp:   Resp: 22    BP 124/76  Pulse 88  Temp(Src) 98.1 F (36.7 C) (Oral)  Resp 22  SpO2 100%  General Appearance:    Alert, oriented, no distress, appears stated age  Head:    Normocephalic, atraumatic  Eyes:    PERRL, EOMI, sclera non-icteric        Nose:   Nares without drainage or epistaxis. Mucosa, turbinates normal  Throat:   Moist mucous membranes. Oropharynx without erythema or exudate.  Neck:    Supple. No carotid bruits.  No thyromegaly.  No lymphadenopathy.   Back:     No CVA tenderness, no spinal tenderness  Lungs:     Wheezy with coarse breath sounds in both lungs  Chest wall:    No tenderness to palpitation  Heart:    Regular rate and rhythm without murmurs, gallops, rubs  Abdomen:     Soft, non-tender, nondistended, normal bowel sounds, no organomegaly  Genitalia:    deferred  Rectal:  deferred  Extremities:   No clubbing, cyanosis or edema.  Pulses:   2+ and symmetric all extremities  Skin:   Skin color, texture, turgor normal, no rashes or lesions  Lymph nodes:   Cervical, supraclavicular, and axillary nodes normal  Neurologic:   CNII-XII intact. Normal strength, sensation and reflexes      throughout    Labs on Admission:  Basic Metabolic Panel:  Recent Labs Lab 06/03/13 1628  NA 138  K 3.4*  CL 100  CO2 23  GLUCOSE 106*  BUN 7  CREATININE 0.56  CALCIUM 9.5   Liver Function Tests: No results found for this basename: AST, ALT, ALKPHOS, BILITOT, PROT, ALBUMIN,  in the last 168 hours No results found for this basename: LIPASE, AMYLASE,  in the last 168 hours No results found for this basename: AMMONIA,  in the last 168 hours CBC:  Recent Labs Lab 06/03/13 1628  WBC 7.9  HGB 15.7*  HCT 44.5  MCV 91.6  PLT 216   Cardiac Enzymes: No results found for this basename: CKTOTAL, CKMB, CKMBINDEX, TROPONINI,  in the last 168 hours  BNP (last 3 results) No results found for this basename: PROBNP,  in the last 8760 hours CBG: No results found for this basename: GLUCAP,  in the last 168 hours  Radiological Exams on Admission: Dg Chest 2 View (if Patient Has Fever And/or Copd)  06/03/2013   CLINICAL DATA:  Chest pain  EXAM: CHEST  2 VIEW  COMPARISON:  09/05/2012  FINDINGS: Cardiomediastinal silhouette is stable. Status post median sternotomy. No acute infiltrate or pleural effusion. No pulmonary edema. Mild degenerative changes thoracic spine.   IMPRESSION: No active cardiopulmonary disease.   Electronically Signed   By: Natasha Mead M.D.   On: 06/03/2013 16:53    EKG: Independently reviewed.  Assessment/Plan Active Problems:   Acute bronchitis   1. Acute viral bronchitis - admitting patient for overnight observation, prednisone 40mg  daily (got 125 solumedrol here in ED) breathing improved with breathing treatments, sating well on room air at this time.    Code Status: Full  Family Communication: No family in room Disposition Plan: Admit to obs   Time spent: 50 min  Caden Fukushima M. [facility] Pager [phone]  If 7AM-7PM, please contact the day team taking care of the patient [email or web address] Password [email or web address] 06/03/2013, 10:05 PM

## 2013-06-04 DIAGNOSIS — I2510 Atherosclerotic heart disease of native coronary artery without angina pectoris: Secondary | ICD-10-CM

## 2013-06-04 DIAGNOSIS — K5289 Other specified noninfective gastroenteritis and colitis: Secondary | ICD-10-CM | POA: Diagnosis not present

## 2013-06-04 DIAGNOSIS — J209 Acute bronchitis, unspecified: Secondary | ICD-10-CM | POA: Diagnosis not present

## 2013-06-04 LAB — CBC
HCT: 43.3 % (ref 36.0–46.0)
Hemoglobin: 15.4 g/dL — ABNORMAL HIGH (ref 12.0–15.0)
MCH: 32.4 pg (ref 26.0–34.0)
MCHC: 35.6 g/dL (ref 30.0–36.0)
RDW: 12.7 % (ref 11.5–15.5)
WBC: 5.6 10*3/uL (ref 4.0–10.5)

## 2013-06-04 LAB — BASIC METABOLIC PANEL
BUN: 8 mg/dL (ref 6–23)
Calcium: 9.6 mg/dL (ref 8.4–10.5)
Chloride: 103 mEq/L (ref 96–112)
Creatinine, Ser: 0.57 mg/dL (ref 0.50–1.10)
GFR calc Af Amer: >90 mL/min (ref >90)
GFR calc non Af Amer: >90 mL/min (ref >90)
Glucose, Bld: 208 mg/dL — ABNORMAL HIGH (ref 70–99)
Sodium: 139 mEq/L (ref 135–145)

## 2013-06-04 MED ORDER — ALBUTEROL SULFATE (5 MG/ML) 0.5% IN NEBU: 2.5000 mg | INHALATION_SOLUTION | Freq: Two times a day (BID) | RESPIRATORY_TRACT | Status: DC

## 2013-06-04 MED ORDER — POTASSIUM CHLORIDE CRYS ER 20 MEQ PO TBCR
60.0000 meq | EXTENDED_RELEASE_TABLET | Freq: Once | ORAL | Status: AC
  Administered 2013-06-04: 60 meq via ORAL
  Filled 2013-06-04: qty 3

## 2013-06-04 MED ORDER — IPRATROPIUM BROMIDE 0.02 % IN SOLN: 0.5000 mg | Freq: Two times a day (BID) | RESPIRATORY_TRACT | Status: DC

## 2013-06-04 MED ORDER — AZITHROMYCIN 250 MG PO TABS: ORAL_TABLET | ORAL | Status: DC

## 2013-06-04 MED ORDER — PREDNISONE (PAK) 10 MG PO TABS: ORAL_TABLET | ORAL | Status: DC

## 2013-06-04 MED ORDER — INFLUENZA VAC SPLIT QUAD 0.5 ML IM SUSP: 0.5000 mL | INTRAMUSCULAR | Status: DC

## 2013-06-04 MED ORDER — ALBUTEROL SULFATE HFA 108 (90 BASE) MCG/ACT IN AERS: 2.0000 | INHALATION_SPRAY | Freq: Four times a day (QID) | RESPIRATORY_TRACT | Status: DC | PRN

## 2013-06-04 NOTE — ED Provider Notes (Signed)
I saw and evaluated the patient, reviewed the resident's note and I agree with the findings and plan.  EKG Interpretation    Date/Time:  Wednesday June 03 2013 16:22:31 EST Ventricular Rate:  101 PR Interval:  128 QRS Duration: 74 QT Interval:  356 QTC Calculation: 461 R Axis:   105 Text Interpretation:  Sinus tachycardia Right atrial enlargement Rightward axis Cannot rule out Anterior infarct , age undetermined Abnormal ECG Confirmed by Jlen Wintle  MD, Camrin Gearheart ([id number]) on 06/04/2013 1:13:04 PM            Patient's sx c/w bronchitis. Only mild to moderate improvement with albuterol/atrovent and steroids. No signs of bacterial infection. Will admit for further respiratory care.   Audree Camel, MD 06/04/13 [phone]

## 2013-06-04 NOTE — Care Management Note (Signed)
    Page 1 of 1   06/04/2013     10:40:28 AM   CARE MANAGEMENT NOTE 06/04/2013  Patient:  [name], [name]   Account Number:  [id number]  Date Initiated:  06/04/2013  Documentation initiated by:  Letha Cape  Subjective/Objective Assessment:   dx acute bronchitis, copd ex  admit as observation- lives with friend. pta indep.     Action/Plan:   Anticipated DC Date:  06/04/2013   Anticipated DC Plan:  HOME/SELF CARE      DC Planning Services  CM consult      Choice offered to / List presented to:             Status of service:  Completed, signed off Medicare Important Message given?   (If response is "NO", the following Medicare IM given date fields will be blank) Date Medicare IM given:   Date Additional Medicare IM given:    Discharge Disposition:  HOME/SELF CARE  Per UR Regulation:  Reviewed for med. necessity/level of care/duration of stay  If discussed at Long Length of Stay Meetings, dates discussed:    Comments:  06/04/13 10:39 Letha Cape RN, BSN [phone] patient lives with friend, pta indep.  Patient for dc today, no needs anticipated.

## 2013-06-04 NOTE — Discharge Summary (Signed)
Physician Discharge Summary  [name] ZOX:[id number] DOB: June 26, 1955 DOA: 06/03/2013  PCP: Nicki Reaper, NP  Admit date: 06/03/2013 Discharge date: 06/04/2013  Time spent: 40 minutes  Recommendations for Outpatient Follow-up:  1. Followup with primary care physician within one week.  Discharge Diagnoses:  Active Problems:   Acute bronchitis   Discharge Condition: Stable  Diet recommendation:  regular  History of present illness:  [name] is a 57 y.o. female who presents to the ED with c/o SOB, cough, congestion. Symptoms onset suddenly 3 days ago and have been progressively worsening since onset. Relieved here in ED temporarily with breathing treatments. History positive for sick contact with identical symptoms in the past week.  Hospital Course:   1. Acute bronchitis: Patient given to the hospital shortness of breath when wheezing, started on prednisone, bronchodilators oxygen and her symptoms improved. Patient did have prolonged history of cigarette smoking. I will treat her as acute COPD exacerbation. She will be discharged on Z-Pak, prednisone taper and albuterol as needed. Patient probably will benefit from repeating her PFT, long-term bronchodilators as outpatient. I discussed that with her primary care provider Mrs. [employer]  2. Tobacco abuse: Patient counseled extensively about cigarette smoking cessation.  3. Hyperlipidemia/CABG x2: Patient had CABG done in [place], she continues to smokes she was counseled extensively against that. Patient is on aspirin, beta blockers and statins. No active issues no mention of chest pain.  Procedures:  None  Consultations:  None  Discharge Exam: Filed Vitals:   06/04/13 0955  BP: 118/76  Pulse: 88  Temp:   Resp:    General: Alert and awake, oriented x3, not in any acute distress. HEENT: anicteric sclera, pupils reactive to light and accommodation, EOMI CVS: S1-S2 clear, no murmur rubs or  gallops Chest: clear to auscultation bilaterally, no wheezing, rales or rhonchi Abdomen: soft nontender, nondistended, normal bowel sounds, no organomegaly Extremities: no cyanosis, clubbing or edema noted bilaterally Neuro: Cranial nerves II-XII intact, no focal neurological deficits  Discharge Instructions  Discharge Orders   Future Appointments Provider Department Dept Phone   07/06/2013 8:45 AM Nicki Reaper, NP [facility] [phone]   07/22/2013 10:45 AM Peter M [place], MD [facility] Office [phone]   Future Orders Complete By Expires   Increase activity slowly  As directed        Medication List         albuterol 108 (90 BASE) MCG/ACT inhaler  Commonly known as:  PROVENTIL HFA;VENTOLIN HFA  Inhale 2 puffs into the lungs every 6 (six) hours as needed for wheezing or shortness of breath.     ALPRAZolam 1 MG tablet  Commonly known as:  XANAX  Take 1 tablet (1 mg total) by mouth 3 (three) times daily as needed for sleep.     amLODipine 5 MG tablet  Commonly known as:  NORVASC  Take 1 tablet (5 mg total) by mouth every evening.     azithromycin 250 MG tablet  Commonly known as:  ZITHROMAX Z-PAK  Take 2 tablets on day 1, followed by 1 tablet daily     clopidogrel 75 MG tablet  Commonly known as:  PLAVIX  Take 1 tablet (75 mg total) by mouth every evening.     DEXILANT 60 MG capsule  Generic drug:  dexlansoprazole  Take 60 mg by mouth daily.     HYDROcodone-acetaminophen 10-325 MG per tablet  Commonly known as:  NORCO  Take 1 tablet by mouth every  4 (four) hours as needed.     levothyroxine 25 MCG tablet  Commonly known as:  SYNTHROID, LEVOTHROID  Take 1 tablet (25 mcg total) by mouth every evening.     metoprolol succinate 50 MG 24 hr tablet  Commonly known as:  TOPROL-XL  Take 50 mg by mouth daily. Take with or immediately following a meal.     nitroGLYCERIN 0.4 MG SL tablet  Commonly known as:  NITROSTAT  Place 1  tablet (0.4 mg total) under the tongue every 5 (five) minutes as needed (up to 3 doses). For chest pain     predniSONE 10 MG tablet  Commonly known as:  STERAPRED UNI-PAK  Take 6-5-4-3-2-1 tab PO daily till gone     promethazine 25 MG tablet  Commonly known as:  PHENERGAN  Take 25 mg by mouth every 6 (six) hours as needed. For nausea     rosuvastatin 20 MG tablet  Commonly known as:  CRESTOR  Take 20 mg by mouth every evening.       Allergies  Allergen Reactions  . Morphine And Related Other (See Comments)    Flushing & "makes me feel weird; that sets off a panic attack" Pain meds lowers blood pressure  . Ranitidine Hives  . Sulfa Antibiotics Other (See Comments)    Childhood reaction       Follow-up Information   Follow up with BAITY, REGINA, NP In 1 week.   Specialty:  Internal Medicine   Contact information:   [address] [place] [address] [phone]        The results of significant diagnostics from this hospitalization (including imaging, microbiology, ancillary and laboratory) are listed below for reference.    Significant Diagnostic Studies: Dg Chest 2 View (if Patient Has Fever And/or Copd)  06/03/2013   CLINICAL DATA:  Chest pain  EXAM: CHEST  2 VIEW  COMPARISON:  09/05/2012  FINDINGS: Cardiomediastinal silhouette is stable. Status post median sternotomy. No acute infiltrate or pleural effusion. No pulmonary edema. Mild degenerative changes thoracic spine.  IMPRESSION: No active cardiopulmonary disease.   Electronically Signed   By: Natasha Mead M.D.   On: 06/03/2013 16:53    Microbiology: No results found for this or any previous visit (from the past 240 hour(s)).   Labs: Basic Metabolic Panel:  Recent Labs Lab 06/03/13 1628 06/04/13 0553  NA 138 139  K 3.4* 3.3*  CL 100 103  CO2 23 24  GLUCOSE 106* 208*  BUN 7 8  CREATININE 0.56 0.57  CALCIUM 9.5 9.6   Liver Function Tests: No results found for this basename: AST, ALT, ALKPHOS,  BILITOT, PROT, ALBUMIN,  in the last 168 hours No results found for this basename: LIPASE, AMYLASE,  in the last 168 hours No results found for this basename: AMMONIA,  in the last 168 hours CBC:  Recent Labs Lab 06/03/13 1628 06/04/13 0553  WBC 7.9 5.6  HGB 15.7* 15.4*  HCT 44.5 43.3  MCV 91.6 91.0  PLT 216 223   Cardiac Enzymes: No results found for this basename: CKTOTAL, CKMB, CKMBINDEX, TROPONINI,  in the last 168 hours BNP: BNP (last 3 results) No results found for this basename: PROBNP,  in the last 8760 hours CBG: No results found for this basename: GLUCAP,  in the last 168 hours     Signed:  [facility]  [facility] 06/04/2013, 11:15 AM   '

## 2013-06-04 NOTE — Progress Notes (Signed)
[name] to be D/C'd Home per MD order.  Discussed with the patient and all questions fully answered.    Medication List         albuterol 108 (90 BASE) MCG/ACT inhaler  Commonly known as:  PROVENTIL HFA;VENTOLIN HFA  Inhale 2 puffs into the lungs every 6 (six) hours as needed for wheezing or shortness of breath.     ALPRAZolam 1 MG tablet  Commonly known as:  XANAX  Take 1 tablet (1 mg total) by mouth 3 (three) times daily as needed for sleep.     amLODipine 5 MG tablet  Commonly known as:  NORVASC  Take 1 tablet (5 mg total) by mouth every evening.     azithromycin 250 MG tablet  Commonly known as:  ZITHROMAX Z-PAK  Take 2 tablets on day 1, followed by 1 tablet daily     clopidogrel 75 MG tablet  Commonly known as:  PLAVIX  Take 1 tablet (75 mg total) by mouth every evening.     DEXILANT 60 MG capsule  Generic drug:  dexlansoprazole  Take 60 mg by mouth daily.     HYDROcodone-acetaminophen 10-325 MG per tablet  Commonly known as:  NORCO  Take 1 tablet by mouth every 4 (four) hours as needed.     levothyroxine 25 MCG tablet  Commonly known as:  SYNTHROID, LEVOTHROID  Take 1 tablet (25 mcg total) by mouth every evening.     metoprolol succinate 50 MG 24 hr tablet  Commonly known as:  TOPROL-XL  Take 50 mg by mouth daily. Take with or immediately following a meal.     nitroGLYCERIN 0.4 MG SL tablet  Commonly known as:  NITROSTAT  Place 1 tablet (0.4 mg total) under the tongue every 5 (five) minutes as needed (up to 3 doses). For chest pain     predniSONE 10 MG tablet  Commonly known as:  STERAPRED UNI-PAK  Take 6-5-4-3-2-1 tab PO daily till gone     promethazine 25 MG tablet  Commonly known as:  PHENERGAN  Take 25 mg by mouth every 6 (six) hours as needed. For nausea     rosuvastatin 20 MG tablet  Commonly known as:  CRESTOR  Take 20 mg by mouth every evening.        VVS, Skin clean, dry and intact without evidence of skin break down, no evidence of  skin tears noted. IV catheter discontinued intact. Site without signs and symptoms of complications. Dressing and pressure applied.  An After Visit Summary was printed and given to the patient.  D/c education completed with patient/family including follow up instructions, medication list, d/c activities limitations if indicated, with other d/c instructions as indicated by MD - patient able to verbalize understanding, all questions fully answered.   Patient instructed to return to ED, call 911, or call MD for any changes in condition.   Patient escorted via WC, and D/C home via private auto.  [name] 06/04/2013 12:26 PM

## 2013-06-04 NOTE — Telephone Encounter (Signed)
Called to check on pt and she was admitted to [place], pt had the physician in the room and I forwarded call to Nicki Reaper, NP

## 2013-06-10 ENCOUNTER — Encounter: Payer: Self-pay | Admitting: Radiology

## 2013-06-11 ENCOUNTER — Ambulatory Visit: Payer: Medicare Other | Admitting: Internal Medicine

## 2013-06-19 ENCOUNTER — Other Ambulatory Visit: Payer: Self-pay

## 2013-06-19 DIAGNOSIS — M545 Low back pain, unspecified: Secondary | ICD-10-CM

## 2013-06-19 MED ORDER — HYDROCODONE-ACETAMINOPHEN 10-325 MG PO TABS: 1.0000 | ORAL_TABLET | ORAL | Status: DC | PRN

## 2013-06-19 NOTE — Telephone Encounter (Signed)
Pt notified Rx ready for pickup 

## 2013-06-19 NOTE — Telephone Encounter (Signed)
Px printed for pick up in IN box  

## 2013-06-19 NOTE — Telephone Encounter (Signed)
Pt left v/m requesting rx hydrocodone apap. Call when ready for pick up.  

## 2013-06-23 ENCOUNTER — Encounter: Payer: Self-pay | Admitting: Radiology

## 2013-06-23 DIAGNOSIS — Z79899 Other long term (current) drug therapy: Secondary | ICD-10-CM | POA: Diagnosis not present

## 2013-07-06 ENCOUNTER — Ambulatory Visit: Payer: Medicare Other | Admitting: Internal Medicine

## 2013-07-06 ENCOUNTER — Other Ambulatory Visit: Payer: Self-pay

## 2013-07-06 DIAGNOSIS — Z0289 Encounter for other administrative examinations: Secondary | ICD-10-CM

## 2013-07-06 MED ORDER — METOPROLOL SUCCINATE ER 50 MG PO TB24: 50.0000 mg | ORAL_TABLET | Freq: Every day | ORAL | Status: DC

## 2013-07-08 ENCOUNTER — Ambulatory Visit: Payer: Medicare Other | Admitting: Internal Medicine

## 2013-07-08 ENCOUNTER — Telehealth: Payer: Self-pay | Admitting: Internal Medicine

## 2013-07-08 DIAGNOSIS — Z0289 Encounter for other administrative examinations: Secondary | ICD-10-CM

## 2013-07-08 NOTE — Telephone Encounter (Signed)
Pt request Alprazolam 1mg --3 times a day. [facility] on [address].

## 2013-07-08 NOTE — Telephone Encounter (Signed)
She is not due until 08/08/2013

## 2013-07-08 NOTE — Telephone Encounter (Signed)
Please advise 

## 2013-07-09 ENCOUNTER — Encounter: Payer: Self-pay | Admitting: Internal Medicine

## 2013-07-09 ENCOUNTER — Telehealth: Payer: Self-pay | Admitting: Internal Medicine

## 2013-07-09 NOTE — Telephone Encounter (Signed)
Thank you for calling her

## 2013-07-09 NOTE — Telephone Encounter (Signed)
Called pt in reference to her last minute cancellations and her no shows for recent appointments.  She states she did not have a ride for 1 and she was confused about the time for the other.  She states she knows she has an appt with [name], NP on 07/15/13 at 3:45pm.  She understands that if she continues to no show her appointments, then she will be dismissed from the practice.

## 2013-07-15 ENCOUNTER — Ambulatory Visit (INDEPENDENT_AMBULATORY_CARE_PROVIDER_SITE_OTHER): Payer: Medicare Other | Admitting: Internal Medicine

## 2013-07-15 ENCOUNTER — Encounter: Payer: Self-pay | Admitting: Internal Medicine

## 2013-07-15 VITALS — BP 114/74 | HR 83 | Temp 98.3°F | Wt 148.5 lb

## 2013-07-15 DIAGNOSIS — M545 Low back pain, unspecified: Secondary | ICD-10-CM | POA: Diagnosis not present

## 2013-07-15 DIAGNOSIS — J209 Acute bronchitis, unspecified: Secondary | ICD-10-CM

## 2013-07-15 MED ORDER — HYDROCODONE-ACETAMINOPHEN 10-325 MG PO TABS: 1.0000 | ORAL_TABLET | ORAL | Status: DC | PRN

## 2013-07-15 NOTE — Progress Notes (Signed)
Subjective:    Patient ID: Retina Kusak, female    DOB: [birth year], 58 y.o.   MRN: [id number]  HPI  Pt presents to the clinic today for hospital follow up. She was admitted on 06/03/13 for acute bronchitis. During her hospital stay, she was given, prednisone, bronchodilated and oxygen. She did improve. She was discharged with a z pack, prednisone taper and albuterol as needed. She finished all her medication. She is feeling much better. She has not had any recurrent symptoms. She does need a refill of her hydrocodone.  Review of Systems      Past Medical History  Diagnosis Date  . Chest pain, cardiac     Has chronic chest pain.   . Coronary artery disease     a) s/p CABG x 2 in [place] 2002. b)stenting to the SVG-OM in 2009. c)DES to the saphenous vein graft to the ramus in March 2012. d) s/p PTCA/DES to native ramus intermedius in September 2012. Last cath 03/2011 with minimal nonobstructive disease  e) indeterminant myoview 09/2011 but atypical CP & no perfusion defect in ramus intermediate distribution, EF 52%  . Hypertension   . Hyperlipidemia   . Tobacco abuse   . Hypothyroidism   . Hx: UTI (urinary tract infection)     multiple  . Enterocolitis   . Gastroparesis   . Complication of anesthesia     "my blood pressure will drop out on me"  . 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 Anemia   . Blood transfusion   . GERD (gastroesophageal reflux disease)   . Hematuria 09/25/11    "lately"  . GI bleeding   . Anxiety   . Depression   . PTSD (post-traumatic stress disorder)   . Migraines     h/o    Current Outpatient Prescriptions  Medication Sig Dispense Refill  . albuterol (PROVENTIL HFA;VENTOLIN HFA) 108 (90 BASE) MCG/ACT inhaler Inhale 2 puffs into the lungs every 6 (six) hours as needed for wheezing or shortness of breath.  1 Inhaler  0  . ALPRAZolam (XANAX) 1 MG tablet Take 1 tablet (1 mg total) by mouth 3 (three) times daily as needed for sleep.   90 tablet  2  . amLODipine (NORVASC) 5 MG tablet Take 1 tablet (5 mg total) by mouth every evening.  90 tablet  3  . clopidogrel (PLAVIX) 75 MG tablet Take 1 tablet (75 mg total) by mouth every evening.  90 tablet  3  . DEXILANT 60 MG capsule Take 60 mg by mouth daily.       Marland Kitchen HYDROcodone-acetaminophen (NORCO) 10-325 MG per tablet Take 1 tablet by mouth every 4 (four) hours as needed.  180 tablet  0  . levothyroxine (SYNTHROID, LEVOTHROID) 25 MCG tablet Take 1 tablet (25 mcg total) by mouth every evening.  90 tablet  1  . metoprolol succinate (TOPROL-XL) 50 MG 24 hr tablet Take 1 tablet (50 mg total) by mouth daily. Take with or immediately following a meal.  30 tablet  6  . nitroGLYCERIN (NITROSTAT) 0.4 MG SL tablet Place 1 tablet (0.4 mg total) under the tongue every 5 (five) minutes as needed (up to 3 doses). For chest pain  25 tablet  4  . promethazine (PHENERGAN) 25 MG tablet Take 25 mg by mouth every 6 (six) hours as needed. For nausea      . rosuvastatin (CRESTOR) 20 MG tablet Take 20 mg by mouth every evening.  No current facility-administered medications for this visit.   Facility-Administered Medications Ordered in Other Visits  Medication Dose Route Frequency Provider Last Rate Last Dose  . bupivacaine (MARCAINE) 0.5 % 15 mL, phenazopyridine (PYRIDIUM) 400 mg bladder mixture   Bladder Instillation Once Reece Packer, MD        Allergies  Allergen Reactions  . Morphine And Related Other (See Comments)    Flushing & "makes me feel weird; that sets off a panic attack" Pain meds lowers blood pressure  . Ranitidine Hives  . Sulfa Antibiotics Other (See Comments)    Childhood reaction    Family History  Problem Relation Age of Onset  . Heart disease Father   . Emphysema Mother     smoked  . Asthma Mother   . Heart disease Paternal Grandfather   . Breast cancer Mother     History   Social History  . Marital Status: Widowed    Spouse Name: N/A    Number of  Children: 2  . Years of Education: N/A   Occupational History  . Disabled    Social History Main Topics  . Smoking status: Current Every Day Smoker -- 0.25 packs/day for 15 years    Types: Cigarettes  . Smokeless tobacco: Never Used  . Alcohol Use: No  . Drug Use: Yes    Special: Marijuana     Comment: "as a teen"  . Sexual Activity: Not Currently   Other Topics Concern  . Not on file   Social History Narrative  . No narrative on file     Constitutional: Denies fever, malaise, fatigue, headache or abrupt weight changes.  HEENT: Denies eye pain, eye redness, ear pain, ringing in the ears, wax buildup, runny nose, nasal congestion, bloody nose, or sore throat. Respiratory: Denies difficulty breathing, shortness of breath, cough or sputum production.   Cardiovascular: Denies chest pain, chest tightness, palpitations or swelling in the hands or feet.    No other specific complaints in a complete review of systems (except as listed in HPI above).  Objective:   Physical Exam   BP 114/74  Pulse 83  Temp(Src) 98.3 F (36.8 C) (Oral)  Wt 148 lb 8 oz (67.359 kg)  SpO2 98% Wt Readings from Last 3 Encounters:  07/15/13 148 lb 8 oz (67.359 kg)  06/04/13 150 lb (68.04 kg)  01/19/13 150 lb 6.4 oz (68.221 kg)    General: Appears her stated age, well developed, well nourished in NAD. HEENT: Head: normal shape and size; Eyes: sclera white, no icterus, conjunctiva pink, PERRLA and EOMs intact; Ears: Tm's gray and intact, normal light reflex; Nose: mucosa pink and moist, septum midline; Throat/Mouth: Teeth present, mucosa pink and moist, no exudate, lesions or ulcerations noted.  Neck: Normal range of motion. Neck supple, trachea midline. No massses, lumps or thyromegaly present.  Cardiovascular: Normal rate and rhythm. S1,S2 noted.  No murmur, rubs or gallops noted. No JVD or BLE edema. No carotid bruits noted. Pulmonary/Chest: Normal effort and positive vesicular breath sounds. No  respiratory distress. No wheezes, rales or ronchi noted.     BMET    Component Value Date/Time   NA 139 06/04/2013 0553   K 3.3* 06/04/2013 0553   CL 103 06/04/2013 0553   CO2 24 06/04/2013 0553   GLUCOSE 208* 06/04/2013 0553   BUN 8 06/04/2013 0553   CREATININE 0.57 06/04/2013 0553   CALCIUM 9.6 06/04/2013 0553   GFRNONAA >90 06/04/2013 0553   GFRAA >90 06/04/2013 9518  Lipid Panel     Component Value Date/Time   CHOL 152 06/19/2012 1104   TRIG 185.0* 06/19/2012 1104   HDL 46.40 06/19/2012 1104   CHOLHDL 3 06/19/2012 1104   VLDL 37.0 06/19/2012 1104   LDLCALC 69 06/19/2012 1104    CBC    Component Value Date/Time   WBC 5.6 06/04/2013 0553   RBC 4.76 06/04/2013 0553   HGB 15.4* 06/04/2013 0553   HCT 43.3 06/04/2013 0553   PLT 223 06/04/2013 0553   MCV 91.0 06/04/2013 0553   MCH 32.4 06/04/2013 0553   MCHC 35.6 06/04/2013 0553   RDW 12.7 06/04/2013 0553   LYMPHSABS 1.9 09/05/2012 1620   MONOABS 0.4 09/05/2012 1620   EOSABS 0.0 09/05/2012 1620   BASOSABS 0.0 09/05/2012 1620    Hgb A1C Lab Results  Component Value Date   HGBA1C 5.7* 03/08/2011        Assessment & Plan:   Followup acute bronchitis:  Her symptoms seems completely resolved She did not fill her albuterol She will continue to monitor symptoms  RTC for your 6 month follow up

## 2013-07-15 NOTE — Patient Instructions (Signed)
Acute Bronchitis Bronchitis is inflammation of the airways that extend from the windpipe into the lungs (bronchi). The inflammation often causes mucus to develop. This leads to a cough, which is the most common symptom of bronchitis.  In acute bronchitis, the condition usually develops suddenly and goes away over time, usually in a couple weeks. Smoking, allergies, and asthma can make bronchitis worse. Repeated episodes of bronchitis may cause further lung problems.  CAUSES Acute bronchitis is most often caused by the same virus that causes a cold. The virus can spread from person to person (contagious).  SIGNS AND SYMPTOMS   Cough.   Fever.   Coughing up mucus.   Body aches.   Chest congestion.   Chills.   Shortness of breath.   Sore throat.  DIAGNOSIS  Acute bronchitis is usually diagnosed through a physical exam. Tests, such as chest X-rays, are sometimes done to rule out other conditions.  TREATMENT  Acute bronchitis usually goes away in a couple weeks. Often times, no medical treatment is necessary. Medicines are sometimes given for relief of fever or cough. Antibiotics are usually not needed but may be prescribed in certain situations. In some cases, an inhaler may be recommended to help reduce shortness of breath and control the cough. A cool mist vaporizer may also be used to help thin bronchial secretions and make it easier to clear the chest.  HOME CARE INSTRUCTIONS  Get plenty of rest.   Drink enough fluids to keep your urine clear or pale yellow (unless you have a medical condition that requires fluid restriction). Increasing fluids may help thin your secretions and will prevent dehydration.   Only take over-the-counter or prescription medicines as directed by your health care provider.   Avoid smoking and secondhand smoke. Exposure to cigarette smoke or irritating chemicals will make bronchitis worse. If you are a smoker, consider using nicotine gum or skin  patches to help control withdrawal symptoms. Quitting smoking will help your lungs heal faster.   Reduce the chances of another bout of acute bronchitis by washing your hands frequently, avoiding people with cold symptoms, and trying not to touch your hands to your mouth, nose, or eyes.   Follow up with your health care provider as directed.  SEEK MEDICAL CARE IF: Your symptoms do not improve after 1 week of treatment.  SEEK IMMEDIATE MEDICAL CARE IF:  You develop an increased fever or chills.   You have chest pain.   You have severe shortness of breath.  You have bloody sputum.   You develop dehydration.  You develop fainting.  You develop repeated vomiting.  You develop a severe headache. MAKE SURE YOU:   Understand these instructions.  Will watch your condition.  Will get help right away if you are not doing well or get worse. Document Released: 07/19/2004 Document Revised: 02/11/2013 Document Reviewed: 12/02/2012 Exit[employer] Patient Information 2014 [employer].  

## 2013-07-20 ENCOUNTER — Other Ambulatory Visit: Payer: Self-pay | Admitting: Internal Medicine

## 2013-07-20 ENCOUNTER — Telehealth: Payer: Self-pay | Admitting: Family Medicine

## 2013-07-20 DIAGNOSIS — M545 Low back pain, unspecified: Secondary | ICD-10-CM

## 2013-07-20 MED ORDER — HYDROCODONE-ACETAMINOPHEN 10-325 MG PO TABS: 1.0000 | ORAL_TABLET | ORAL | Status: DC | PRN

## 2013-07-20 NOTE — Telephone Encounter (Signed)
Pt cut the only portion of Hydrocodone RX that had writing on it and took it to the pharmacy.  They would not fill it since she had cut it off of the page.  She is requesting another written RX.  I informed her that someone would call when it was ready and that she would have to bring previous RX with her and give it to [place] before she could pick up another.

## 2013-07-20 NOTE — Telephone Encounter (Signed)
RX done and placed in your inbasket

## 2013-07-20 NOTE — Telephone Encounter (Signed)
Rx left in front office for pick up and pt is aware Pt is also aware that she has bring back original Rx in order to receive a new one

## 2013-07-22 ENCOUNTER — Ambulatory Visit: Payer: Medicare Other | Admitting: Cardiology

## 2013-07-28 ENCOUNTER — Telehealth: Payer: Self-pay | Admitting: Internal Medicine

## 2013-07-28 NOTE — Telephone Encounter (Signed)
Relevant patient education assigned to patient using Emmi. ° °

## 2013-08-07 ENCOUNTER — Telehealth: Payer: Self-pay

## 2013-08-07 DIAGNOSIS — F419 Anxiety disorder, unspecified: Secondary | ICD-10-CM

## 2013-08-07 MED ORDER — PROMETHAZINE HCL 25 MG PO TABS: 25.0000 mg | ORAL_TABLET | Freq: Four times a day (QID) | ORAL | Status: DC | PRN

## 2013-08-07 MED ORDER — ALPRAZOLAM 1 MG PO TABS: 1.0000 mg | ORAL_TABLET | Freq: Three times a day (TID) | ORAL | Status: DC | PRN

## 2013-08-07 NOTE — Telephone Encounter (Signed)
Ok to refill xanax and phenergan. Has she ever tried pantoprazole (protonix) if not, I will change the dexilant to this medication

## 2013-08-07 NOTE — Telephone Encounter (Signed)
Xanax and Phenergan called in will handle GERD medications Monday

## 2013-08-07 NOTE — Telephone Encounter (Signed)
Pt is very nauseated and cannot afford dexilant (pt has been out of med 2 weeks) pt request less expensive med substituted for dexilant. Pt request med to go to [facility]. Pt also request refill alprazolam and promethazine.Please advise.

## 2013-08-10 MED ORDER — PANTOPRAZOLE SODIUM 40 MG PO TBEC: 40.0000 mg | DELAYED_RELEASE_TABLET | Freq: Every day | ORAL | Status: DC

## 2013-08-10 NOTE — Addendum Note (Signed)
Addended by: Lurlean Nanny on: 08/10/2013 03:03 PM   Modules accepted: Orders

## 2013-08-10 NOTE — Telephone Encounter (Signed)
Protonix sent to pharmacy and pt is aware--however she still will want Dexilant in the future, she did not have enough money to pay the co pay

## 2013-08-18 ENCOUNTER — Ambulatory Visit: Payer: Medicare Other | Admitting: Internal Medicine

## 2013-08-18 ENCOUNTER — Encounter: Payer: Self-pay | Admitting: Internal Medicine

## 2013-08-18 ENCOUNTER — Ambulatory Visit (INDEPENDENT_AMBULATORY_CARE_PROVIDER_SITE_OTHER): Payer: Medicare Other | Admitting: Internal Medicine

## 2013-08-18 VITALS — BP 106/72 | HR 79 | Temp 98.6°F | Wt 154.8 lb

## 2013-08-18 DIAGNOSIS — M545 Low back pain, unspecified: Secondary | ICD-10-CM

## 2013-08-18 DIAGNOSIS — K219 Gastro-esophageal reflux disease without esophagitis: Secondary | ICD-10-CM

## 2013-08-18 DIAGNOSIS — R110 Nausea: Secondary | ICD-10-CM | POA: Diagnosis not present

## 2013-08-18 MED ORDER — HYDROCODONE-ACETAMINOPHEN 10-325 MG PO TABS: 1.0000 | ORAL_TABLET | ORAL | Status: DC | PRN

## 2013-08-18 MED ORDER — PROMETHAZINE HCL 25 MG PO TABS: 25.0000 mg | ORAL_TABLET | Freq: Four times a day (QID) | ORAL | Status: DC | PRN

## 2013-08-18 NOTE — Patient Instructions (Addendum)
Nausea, Adult Nausea is the feeling that you have an upset stomach or have to vomit. Nausea by itself is not likely a serious concern, but it may be an early sign of more serious medical problems. As nausea gets worse, it can lead to vomiting. If vomiting develops, there is the risk of dehydration.  CAUSES   Viral infections.  Food poisoning.  Medicines.  Pregnancy.  Motion sickness.  Migraine headaches.  Emotional distress.  Severe pain from any source.  Alcohol intoxication. HOME CARE INSTRUCTIONS  Get plenty of rest.  Ask your caregiver about specific rehydration instructions.  Eat small amounts of food and sip liquids more often.  Take all medicines as told by your caregiver. SEEK MEDICAL CARE IF:  You have not improved after 2 days, or you get worse.  You have a headache. SEEK IMMEDIATE MEDICAL CARE IF:   You have a fever.  You faint.  You keep vomiting or have blood in your vomit.  You are extremely weak or dehydrated.  You have dark or bloody stools.  You have severe chest or abdominal pain. MAKE SURE YOU:  Understand these instructions.  Will watch your condition.  Will get help right away if you are not doing well or get worse. Document Released: 07/19/2004 Document Revised: 03/05/2012 Document Reviewed: 02/21/2011 Exit[employer] Patient Information 2014 [employer].  

## 2013-08-18 NOTE — Progress Notes (Signed)
Pre visit review using our clinic review tool, if applicable. No additional management support is needed unless otherwise documented below in the visit note. 

## 2013-08-18 NOTE — Progress Notes (Signed)
Subjective:    Patient ID: [name], female    DOB: [birth year], 58 y.o.   MRN: [id number]  HPI  Pt presents to the clinic today for follow up of reflux. She continues to have issues and is nauseated often. She is on protonix and phenergan. She has tried Dexilant in the past which did work well but she was unable to afford it. She does request a refill of her phenergan today. She also needs a refill of her norco.  Review of Systems      Past Medical History  Diagnosis Date  . Chest pain, cardiac     Has chronic chest pain.   . Coronary artery disease     a) s/p CABG x 2 in [place] 2002. b)stenting to the SVG-OM in 2009. c)DES to the saphenous vein graft to the ramus in March 2012. d) s/p PTCA/DES to native ramus intermedius in September 2012. Last cath 03/2011 with minimal nonobstructive disease  e) indeterminant myoview 09/2011 but atypical CP & no perfusion defect in ramus intermediate distribution, EF 52%  . Hypertension   . Hyperlipidemia   . Tobacco abuse   . Hypothyroidism   . Hx: UTI (urinary tract infection)     multiple  . Enterocolitis   . Gastroparesis   . Complication of anesthesia     "my blood pressure will drop out on me"  . 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 Anemia   . Blood transfusion   . GERD (gastroesophageal reflux disease)   . Hematuria 09/25/11    "lately"  . GI bleeding   . Anxiety   . Depression   . PTSD (post-traumatic stress disorder)   . Migraines     h/o    Current Outpatient Prescriptions  Medication Sig Dispense Refill  . albuterol (PROVENTIL HFA;VENTOLIN HFA) 108 (90 BASE) MCG/ACT inhaler Inhale 2 puffs into the lungs every 6 (six) hours as needed for wheezing or shortness of breath.  1 Inhaler  0  . ALPRAZolam (XANAX) 1 MG tablet Take 1 tablet (1 mg total) by mouth 3 (three) times daily as needed for sleep.  90 tablet  0  . amLODipine (NORVASC) 5 MG tablet Take 1 tablet (5 mg total) by mouth every evening.   90 tablet  3  . clopidogrel (PLAVIX) 75 MG tablet Take 1 tablet (75 mg total) by mouth every evening.  90 tablet  3  . HYDROcodone-acetaminophen (NORCO) 10-325 MG per tablet Take 1 tablet by mouth every 4 (four) hours as needed.  180 tablet  0  . levothyroxine (SYNTHROID, LEVOTHROID) 25 MCG tablet Take 1 tablet (25 mcg total) by mouth every evening.  90 tablet  1  . metoprolol succinate (TOPROL-XL) 50 MG 24 hr tablet Take 1 tablet (50 mg total) by mouth daily. Take with or immediately following a meal.  30 tablet  6  . nitroGLYCERIN (NITROSTAT) 0.4 MG SL tablet Place 1 tablet (0.4 mg total) under the tongue every 5 (five) minutes as needed (up to 3 doses). For chest pain  25 tablet  4  . pantoprazole (PROTONIX) 40 MG tablet Take 1 tablet (40 mg total) by mouth daily.  30 tablet  0  . promethazine (PHENERGAN) 25 MG tablet Take 1 tablet (25 mg total) by mouth every 6 (six) hours as needed. For nausea  30 tablet  0  . rosuvastatin (CRESTOR) 20 MG tablet Take 20 mg by mouth every evening.  No current facility-administered medications for this visit.   Facility-Administered Medications Ordered in Other Visits  Medication Dose Route Frequency Provider Last Rate Last Dose  . bupivacaine (MARCAINE) 0.5 % 15 mL, phenazopyridine (PYRIDIUM) 400 mg bladder mixture   Bladder Instillation Once Reece Packer, MD        Allergies  Allergen Reactions  . Morphine And Related Other (See Comments)    Flushing & "makes me feel weird; that sets off a panic attack" Pain meds lowers blood pressure  . Ranitidine Hives  . Sulfa Antibiotics Other (See Comments)    Childhood reaction    Family History  Problem Relation Age of Onset  . Heart disease Father   . Emphysema Mother     smoked  . Asthma Mother   . Heart disease Paternal Grandfather   . Breast cancer Mother     History   Social History  . Marital Status: Widowed    Spouse Name: N/A    Number of Children: 2  . Years of Education: N/A    Occupational History  . Disabled    Social History Main Topics  . Smoking status: Current Every Day Smoker -- 0.25 packs/day for 15 years    Types: Cigarettes  . Smokeless tobacco: Never Used  . Alcohol Use: No  . Drug Use: Yes    Special: Marijuana     Comment: "as a teen"  . Sexual Activity: Not Currently   Other Topics Concern  . Not on file   Social History Narrative  . No narrative on file     Constitutional: Denies fever, malaise, fatigue, headache or abrupt weight changes.  Respiratory: Denies difficulty breathing, shortness of breath, cough or sputum production.   Cardiovascular: Denies chest pain, chest tightness, palpitations or swelling in the hands or feet.  Gastrointestinal: Pt reports reflux and nausea. Denies abdominal pain, bloating, constipation, diarrhea or blood in the stool.  Musculoskeletal: Pt reports low back pain. Denies decrease in range of motion, difficulty with gait, muscle pain or joint pain and swelling.    No other specific complaints in a complete review of systems (except as listed in HPI above).  Objective:   Physical Exam    BP 106/72  Pulse 79  Temp(Src) 98.6 F (37 C) (Oral)  Wt 154 lb 12 oz (70.194 kg)  SpO2 98% Wt Readings from Last 3 Encounters:  08/18/13 154 lb 12 oz (70.194 kg)  07/15/13 148 lb 8 oz (67.359 kg)  06/04/13 150 lb (68.04 kg)    General: Appears her stated age, well developed, well nourished in NAD. Cardiovascular: Normal rate and rhythm. S1,S2 noted.  No murmur, rubs or gallops noted. No JVD or BLE edema. No carotid bruits noted. Pulmonary/Chest: Normal effort and positive vesicular breath sounds. No respiratory distress. No wheezes, rales or ronchi noted.  Abdomen: Soft and nontender. Normal bowel sounds, no bruits noted. No distention or masses noted. Liver, spleen and kidneys non palpable. Musculoskeletal: Normal range of motion. No signs of joint swelling. No difficulty with gait.   BMET      Component Value Date/Time   NA 139 06/04/2013 0553   K 3.3* 06/04/2013 0553   CL 103 06/04/2013 0553   CO2 24 06/04/2013 0553   GLUCOSE 208* 06/04/2013 0553   BUN 8 06/04/2013 0553   CREATININE 0.57 06/04/2013 0553   CALCIUM 9.6 06/04/2013 0553   GFRNONAA >90 06/04/2013 0553   GFRAA >90 06/04/2013 0553    Lipid Panel  Component Value Date/Time   CHOL 152 06/19/2012 1104   TRIG 185.0* 06/19/2012 1104   HDL 46.40 06/19/2012 1104   CHOLHDL 3 06/19/2012 1104   VLDL 37.0 06/19/2012 1104   LDLCALC 69 06/19/2012 1104    CBC    Component Value Date/Time   WBC 5.6 06/04/2013 0553   RBC 4.76 06/04/2013 0553   HGB 15.4* 06/04/2013 0553   HCT 43.3 06/04/2013 0553   PLT 223 06/04/2013 0553   MCV 91.0 06/04/2013 0553   MCH 32.4 06/04/2013 0553   MCHC 35.6 06/04/2013 0553   RDW 12.7 06/04/2013 0553   LYMPHSABS 1.9 09/05/2012 1620   MONOABS 0.4 09/05/2012 1620   EOSABS 0.0 09/05/2012 1620   BASOSABS 0.0 09/05/2012 1620    Hgb A1C Lab Results  Component Value Date   HGBA1C 5.7* 03/08/2011        Assessment & Plan:

## 2013-08-18 NOTE — Assessment & Plan Note (Signed)
Norco refilled today.

## 2013-08-18 NOTE — Assessment & Plan Note (Signed)
Well controlled on protonix Still with some nausea- phenergan refilled today

## 2013-08-19 ENCOUNTER — Ambulatory Visit: Payer: Medicare Other | Admitting: Cardiology

## 2013-08-24 ENCOUNTER — Other Ambulatory Visit: Payer: Self-pay

## 2013-08-24 MED ORDER — CLOPIDOGREL BISULFATE 75 MG PO TABS: 75.0000 mg | ORAL_TABLET | Freq: Every evening | ORAL | Status: DC

## 2013-09-15 ENCOUNTER — Other Ambulatory Visit: Payer: Self-pay

## 2013-09-15 DIAGNOSIS — M545 Low back pain, unspecified: Secondary | ICD-10-CM

## 2013-09-15 DIAGNOSIS — F419 Anxiety disorder, unspecified: Secondary | ICD-10-CM

## 2013-09-15 NOTE — Telephone Encounter (Signed)
Pt left v/m requesting rx hydrocodone apap and alprazolam refill to [facility]. Call when ready for pick up.

## 2013-09-16 ENCOUNTER — Telehealth: Payer: Self-pay

## 2013-09-16 ENCOUNTER — Other Ambulatory Visit: Payer: Self-pay

## 2013-09-16 DIAGNOSIS — E039 Hypothyroidism, unspecified: Secondary | ICD-10-CM

## 2013-09-16 MED ORDER — ALPRAZOLAM 1 MG PO TABS: 1.0000 mg | ORAL_TABLET | Freq: Three times a day (TID) | ORAL | Status: DC | PRN

## 2013-09-16 MED ORDER — HYDROCODONE-ACETAMINOPHEN 10-325 MG PO TABS: 1.0000 | ORAL_TABLET | ORAL | Status: DC | PRN

## 2013-09-16 MED ORDER — LEVOTHYROXINE SODIUM 25 MCG PO TABS: 25.0000 ug | ORAL_TABLET | Freq: Every evening | ORAL | Status: DC

## 2013-09-16 NOTE — Telephone Encounter (Signed)
meds printed and signed, ready for pickup

## 2013-09-17 NOTE — Telephone Encounter (Signed)
Patient picked up.

## 2013-09-18 NOTE — Telephone Encounter (Signed)
appointment

## 2013-09-22 ENCOUNTER — Encounter: Payer: Self-pay | Admitting: Cardiology

## 2013-09-22 ENCOUNTER — Ambulatory Visit (INDEPENDENT_AMBULATORY_CARE_PROVIDER_SITE_OTHER): Payer: Medicare Other | Admitting: Cardiology

## 2013-09-22 VITALS — BP 140/88 | HR 87 | Ht 60.0 in | Wt 150.4 lb

## 2013-09-22 DIAGNOSIS — E785 Hyperlipidemia, unspecified: Secondary | ICD-10-CM | POA: Diagnosis not present

## 2013-09-22 DIAGNOSIS — I2510 Atherosclerotic heart disease of native coronary artery without angina pectoris: Secondary | ICD-10-CM

## 2013-09-22 DIAGNOSIS — F17200 Nicotine dependence, unspecified, uncomplicated: Secondary | ICD-10-CM | POA: Diagnosis not present

## 2013-09-22 DIAGNOSIS — I10 Essential (primary) hypertension: Secondary | ICD-10-CM

## 2013-09-22 NOTE — Progress Notes (Signed)
[name] Date of Birth: May 08, 1956 Medical Record #[id number]  History of Present Illness: [name] is seen today for a followup visit. She had remote coronary bypass surgery in 2002. She had prior stenting of the vein graft to the obtuse marginal vessel in 2009. She then had a drug-eluting stent placement to this same saphenous vein graft to the ramus in March of 2012. This subsequently occluded and she underwent angioplasty and drug-eluting stent to the ramus intermediate branch in September of 2012. Cardiac catheterization again in October of 2012 showed nonobstructive disease. She also has a history of inflammatory bowel disease. She was admitted to the hospital on Dec. 10 with COPD exacerbation. It scared her so much she quit smoking. Feels much better now.   Current Outpatient Prescriptions on File Prior to Visit  Medication Sig Dispense Refill  . albuterol (PROVENTIL HFA;VENTOLIN HFA) 108 (90 BASE) MCG/ACT inhaler Inhale 2 puffs into the lungs every 6 (six) hours as needed for wheezing or shortness of breath.  1 Inhaler  0  . ALPRAZolam (XANAX) 1 MG tablet Take 1 tablet (1 mg total) by mouth 3 (three) times daily as needed for sleep.  90 tablet  0  . amLODipine (NORVASC) 5 MG tablet Take 1 tablet (5 mg total) by mouth every evening.  90 tablet  3  . clopidogrel (PLAVIX) 75 MG tablet Take 1 tablet (75 mg total) by mouth every evening.  90 tablet  1  . HYDROcodone-acetaminophen (NORCO) 10-325 MG per tablet Take 1 tablet by mouth every 4 (four) hours as needed.  180 tablet  0  . levothyroxine (SYNTHROID, LEVOTHROID) 25 MCG tablet Take 1 tablet (25 mcg total) by mouth every evening.  90 tablet  0  . metoprolol succinate (TOPROL-XL) 50 MG 24 hr tablet Take 1 tablet (50 mg total) by mouth daily. Take with or immediately following a meal.  30 tablet  6  . nitroGLYCERIN (NITROSTAT) 0.4 MG SL tablet Place 1 tablet (0.4 mg total) under the tongue every 5 (five) minutes as needed (up to 3 doses). For  chest pain  25 tablet  4  . pantoprazole (PROTONIX) 40 MG tablet Take 1 tablet (40 mg total) by mouth daily.  30 tablet  0  . promethazine (PHENERGAN) 25 MG tablet Take 1 tablet (25 mg total) by mouth every 6 (six) hours as needed. For nausea  30 tablet  0  . rosuvastatin (CRESTOR) 20 MG tablet Take 20 mg by mouth every evening.       Current Facility-Administered Medications on File Prior to Visit  Medication Dose Route Frequency Provider Last Rate Last Dose  . bupivacaine (MARCAINE) 0.5 % 15 mL, phenazopyridine (PYRIDIUM) 400 mg bladder mixture   Bladder Instillation Once Reece Packer, MD        Allergies  Allergen Reactions  . Morphine And Related Other (See Comments)    Flushing & "makes me feel weird; that sets off a panic attack" Pain meds lowers blood pressure  . Ranitidine Hives  . Sulfa Antibiotics Other (See Comments)    Childhood reaction    Past Medical History  Diagnosis Date  . Chest pain, cardiac     Has chronic chest pain.   . Coronary artery disease     a) s/p CABG x 2 in [place] 2002. b)stenting to the SVG-OM in 2009. c)DES to the saphenous vein graft to the ramus in March 2012. d) s/p PTCA/DES to native ramus intermedius in September 2012. Last cath 03/2011 with  minimal nonobstructive disease  e) indeterminant myoview 09/2011 but atypical CP & no perfusion defect in ramus intermediate distribution, EF 52%  . Hypertension   . Hyperlipidemia   . Tobacco abuse   . Hypothyroidism   . Hx: UTI (urinary tract infection)     multiple  . Enterocolitis   . Gastroparesis   . Complication of anesthesia     "my blood pressure will drop out on me"  . 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 Anemia   . Blood transfusion   . GERD (gastroesophageal reflux disease)   . Hematuria 09/25/11    "lately"  . GI bleeding   . Anxiety   . Depression   . PTSD (post-traumatic stress disorder)   . Migraines     h/o    Past Surgical History  Procedure  Laterality Date  . Vesicovaginal fistula closure w/ tah  1983  . Tmj arthroplasty  1980's    right side; "I've had to have it twice"  . Coronary artery bypass graft  2002    CABG X2  . Coronary angioplasty with stent placement      "lots; they've all collapsed; last time dr took native vein &  turned it around & put stents in it before attaching"  . Abdominal hysterectomy  1980's  . Salpingoophorectomy  1980's    "2 surgeries; 1st right; then left"  . Biopsy stomach  09/25/11    "several in the last year; all negative; via endoscopy"  . Pilonidal cyst / sinus excision  1980's    "was wrapped around my spinal cord; Dr. Norville Haggard got almost all of it except a tiny bit; said it was really deep; packed fatty tissue in the hole; really bothers me alot"  . Cholecystectomy  2000's  . Breast surgery      right biopsy  . Cysto with hydrodistension  03/28/2012    Procedure: CYSTOSCOPY/HYDRODISTENSION;  Surgeon: Reece Packer, MD;  Location: [facility];  Service: Urology;  Laterality: N/A;  . Cystoscopy with injection  03/28/2012    Procedure: CYSTOSCOPY WITH INJECTION;  Surgeon: Reece Packer, MD;  Location: [facility];  Service: Urology;  Laterality: N/A;  Marcaine and Pyridium  . Cystoscopy w/ retrogrades  03/28/2012    Procedure: CYSTOSCOPY WITH RETROGRADE PYELOGRAM;  Surgeon: Reece Packer, MD;  Location: [facility];  Service: Urology;  Laterality: Bilateral;    History  Smoking status  . Current Every Day Smoker -- 0.25 packs/day for 15 years  . Types: Cigarettes  Smokeless tobacco  . Never Used    History  Alcohol Use No    Family History  Problem Relation Age of Onset  . Heart disease Father   . Emphysema Mother     smoked  . Asthma Mother   . Heart disease Paternal Grandfather   . Breast cancer Mother     Review of Systems: The review of systems is  positive for chronic pain. She also has a history of anxiety and depression.  All other systems were reviewed and are  negative.  Physical Exam: BP 140/88  Pulse 87  Ht 5' (1.524 m)  Wt 150 lb 6.4 oz (68.221 kg)  BMI 29.37 kg/m2 Patient is alert and in no acute distress. Skin is warm and dry. Color is normal.  HEENT is unremarkable. Normocephalic/atraumatic. PERRL. Sclera are nonicteric. Neck is supple. No masses. No JVD. Lungs are coarse. Cardiac exam shows a regular  rate and rhythm. Abdomen is soft. Extremities are without edema. Gait and ROM are intact. No gross neurologic deficits noted.  LABORATORY DATA:   Lab Results  Component Value Date   WBC 5.6 06/04/2013   HGB 15.4* 06/04/2013   HCT 43.3 06/04/2013   PLT 223 06/04/2013   GLUCOSE 208* 06/04/2013   CHOL 152 06/19/2012   TRIG 185.0* 06/19/2012   HDL 46.40 06/19/2012   LDLCALC 69 06/19/2012   ALT 20 09/05/2012   AST 18 09/05/2012   NA 139 06/04/2013   K 3.3* 06/04/2013   CL 103 06/04/2013   CREATININE 0.57 06/04/2013   BUN 8 06/04/2013   CO2 24 06/04/2013   TSH 3.37 06/19/2012   INR 1.07 09/25/2011   HGBA1C 5.7* 03/08/2011     Assessment / Plan: 1. Coronary disease with previous bypass and multiple interventions as noted above. Symptomatically she is doing quite well. I would recommend that she remain on Plavix indefinitely. I will followup again in 6 months.  2. Hypertension, controlled.  3. Tobacco abuse. Quit since December. Encourage continued cessation.  4. Hyperlipidemia.  5. Inflammatory bowel disease.  6. Gastritis.

## 2013-09-22 NOTE — Patient Instructions (Signed)
We will get fasting lab work  Continue your current therapy  Congratulations on quitting smoking!!!

## 2013-09-23 ENCOUNTER — Other Ambulatory Visit: Payer: Medicare Other

## 2013-09-24 IMAGING — CT CT HEAD W/O CM
1 of 2 series · 13 of 30 positions shown, 17 images · non-contrast
Comparison: None.

CLINICAL DATA: Seizure.  Fall.

CT HEAD WITHOUT CONTRAST
TECHNIQUE: Contiguous axial images were obtained from the base of
the skull through the vertex without contrast.

[Series 2: brain · axial · 0.49mm/px · z∈[-140,-10]mm · 13 of 32 slices shown, 17 images]
[im 3/32  brain]
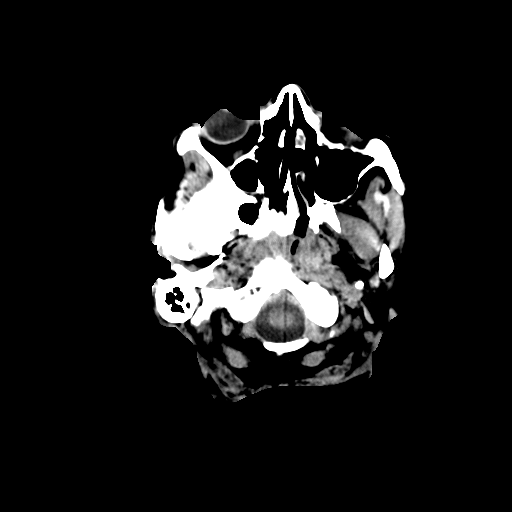
[im 3/32  bone]
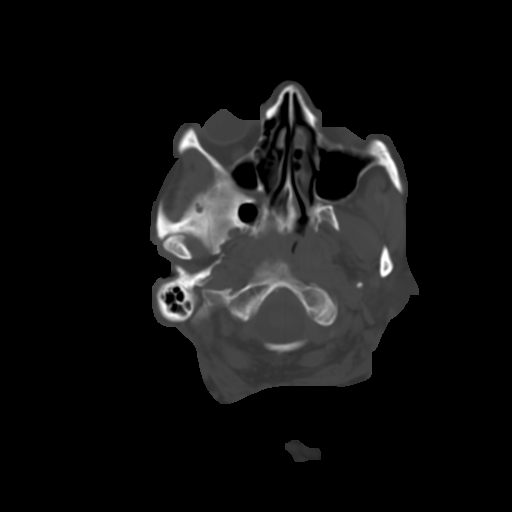
[im 5/32  brain]
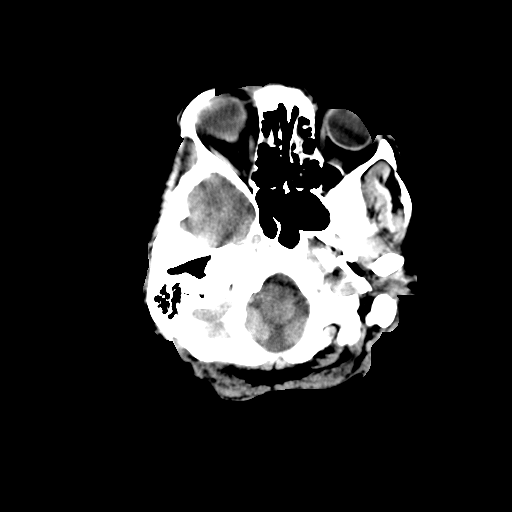
[im 7/32  brain]
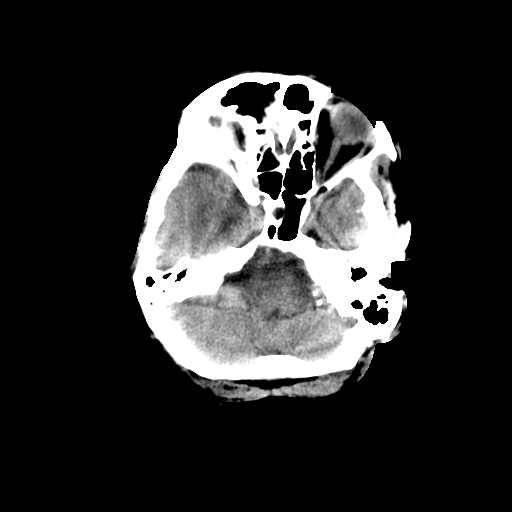
[im 9/32  brain]
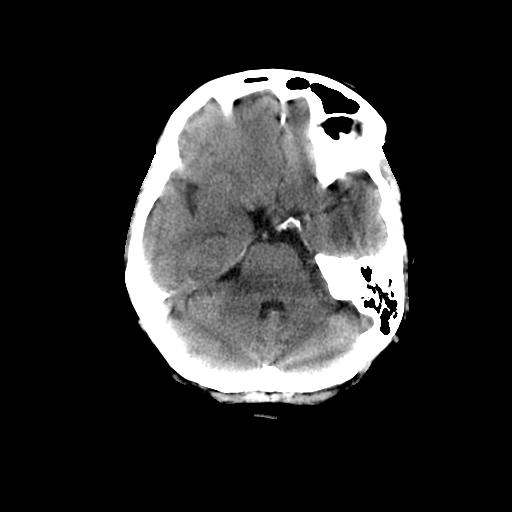
[im 12/32  brain]
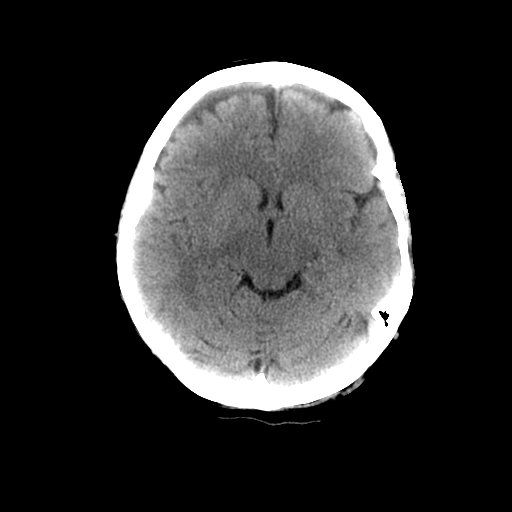
[im 12/32  bone]
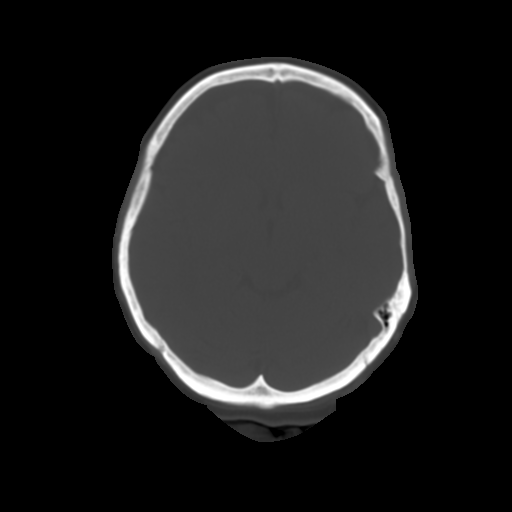
[im 14/32  brain]
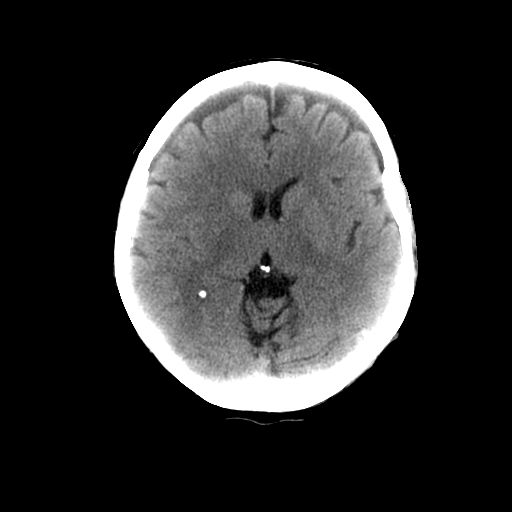
[im 16/32  brain]
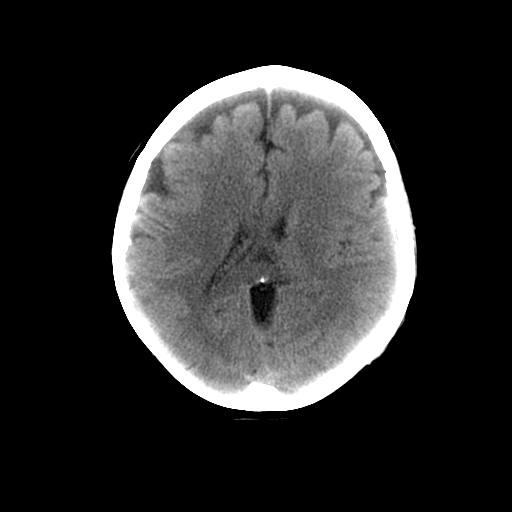
[im 18/32  brain]
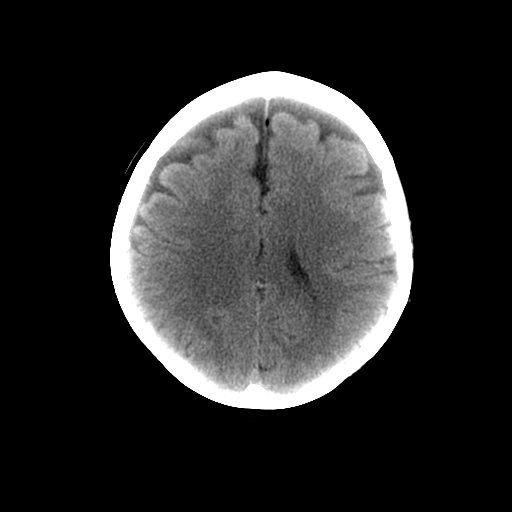
[im 20/32  brain]
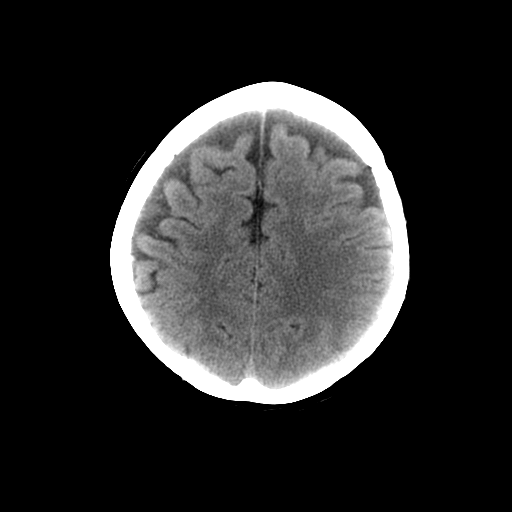
[im 20/32  bone]
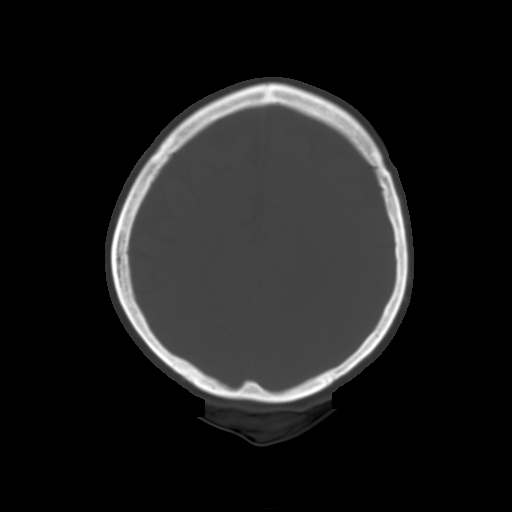
[im 23/32  brain]
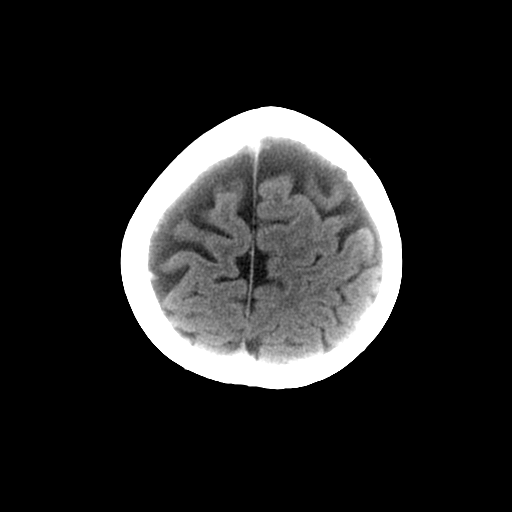
[im 25/32  brain]
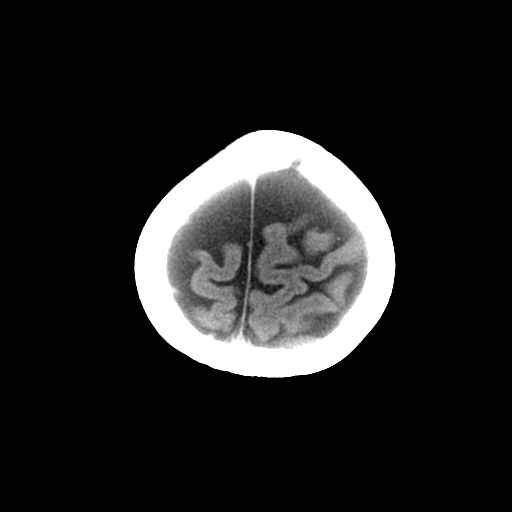
[im 27/32  brain]
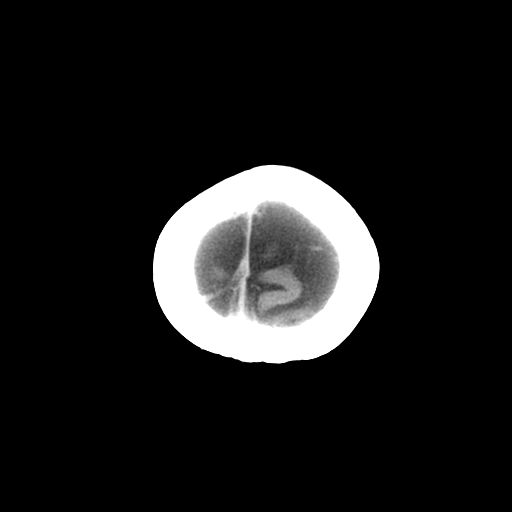
[im 29/32  brain]
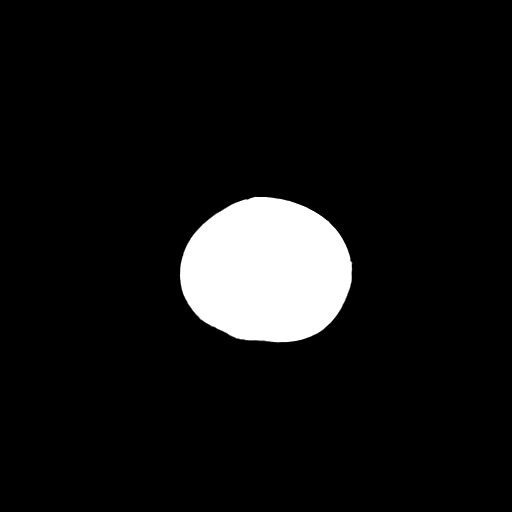
[im 29/32  bone]
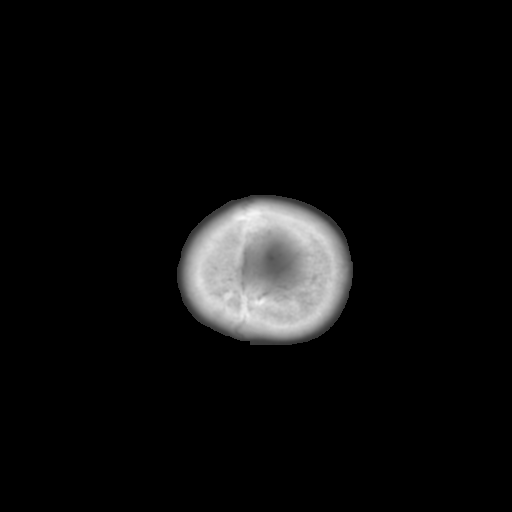

[13 of 30 positions shown; findings below may reference images not displayed]

FINDINGS: There is advanced for age cortical atrophy.  No evidence
of acute intracranial abnormality including infarction, hemorrhage,
mass lesion, mass effect, midline shift or abnormal extra-axial
fluid collection.  No hydrocephalus or pneumocephalus.  The
calvarium is intact.
IMPRESSION: 1.  No acute finding.
2.  Cortical atrophy.

## 2013-09-24 IMAGING — CR DG CHEST 2V
2 series · 2 of 2 positions shown · non-contrast
Comparison: 09/24/2011

CLINICAL DATA: Seizures, fall

CHEST - 2 VIEW

[w chest pa]
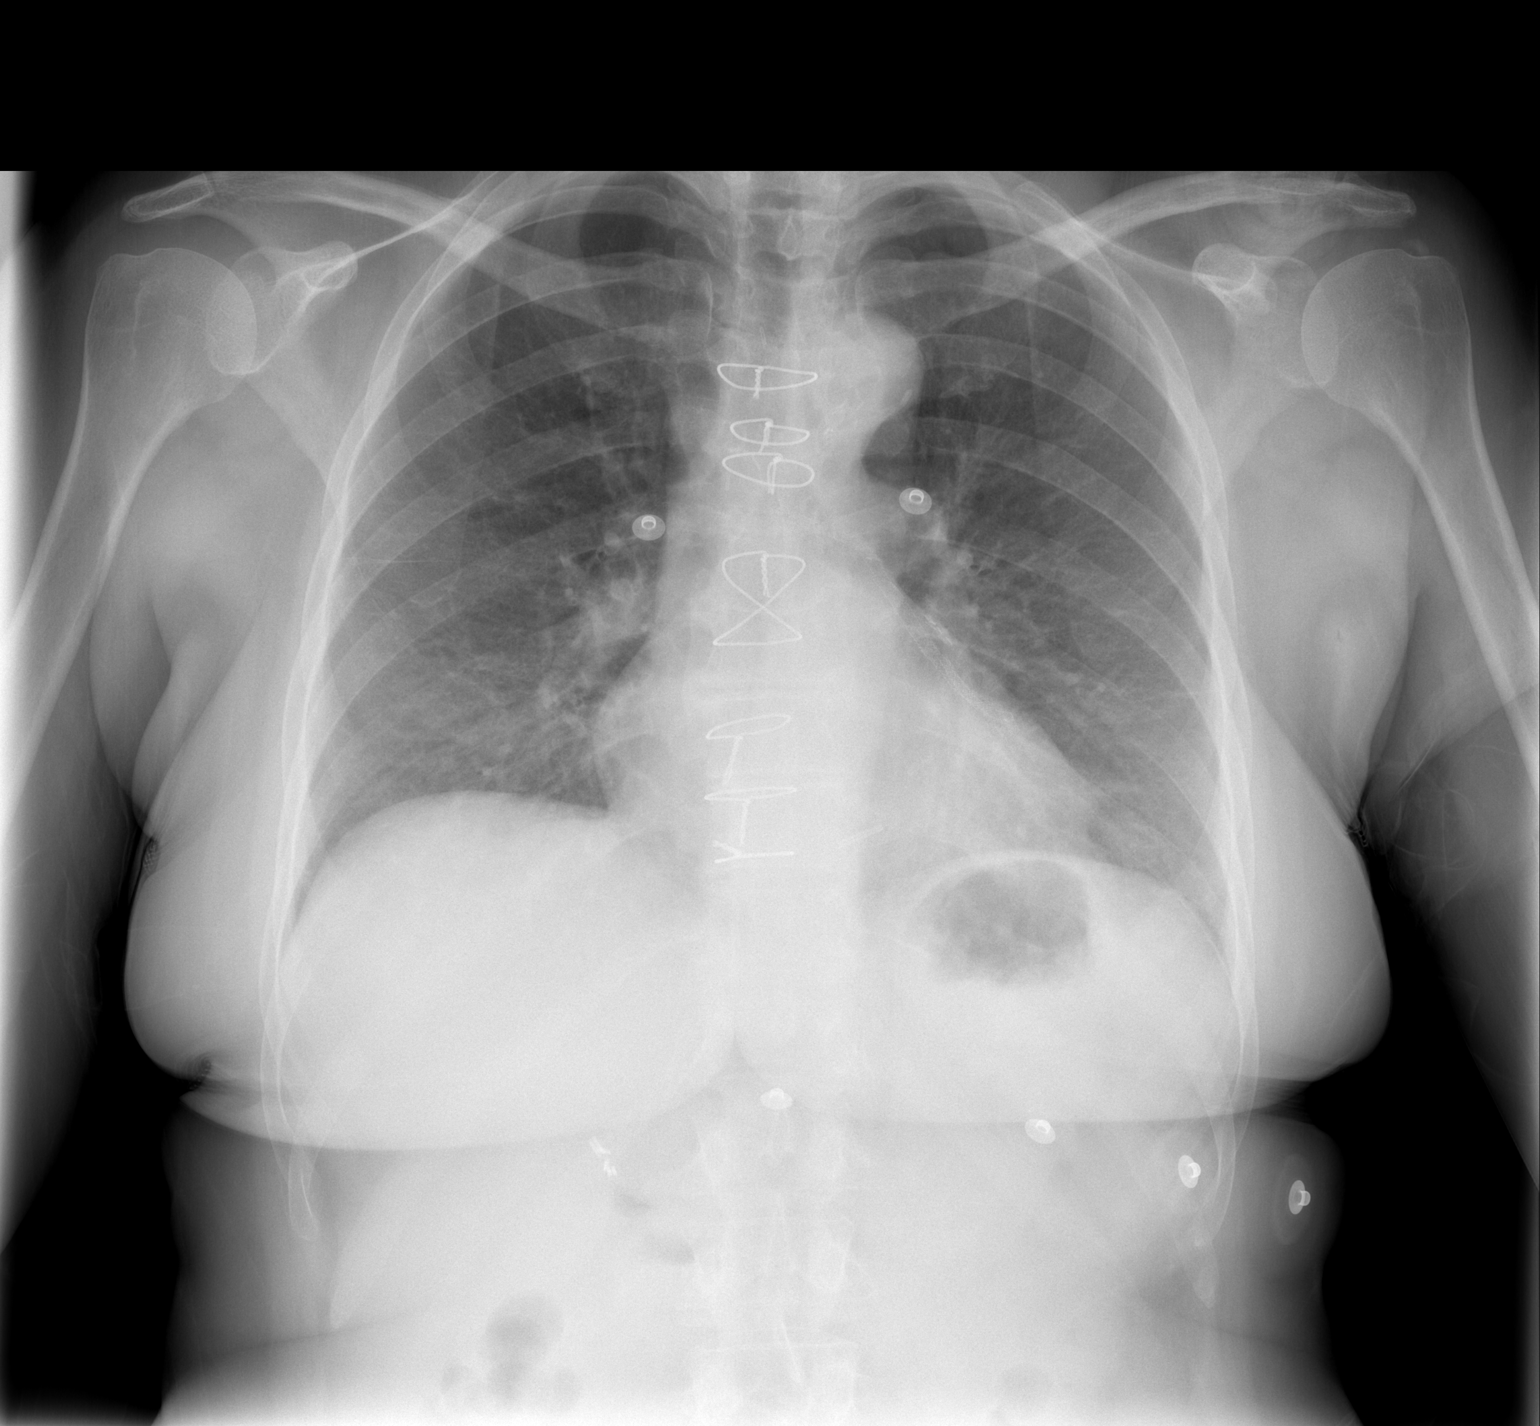

[w chest lat]
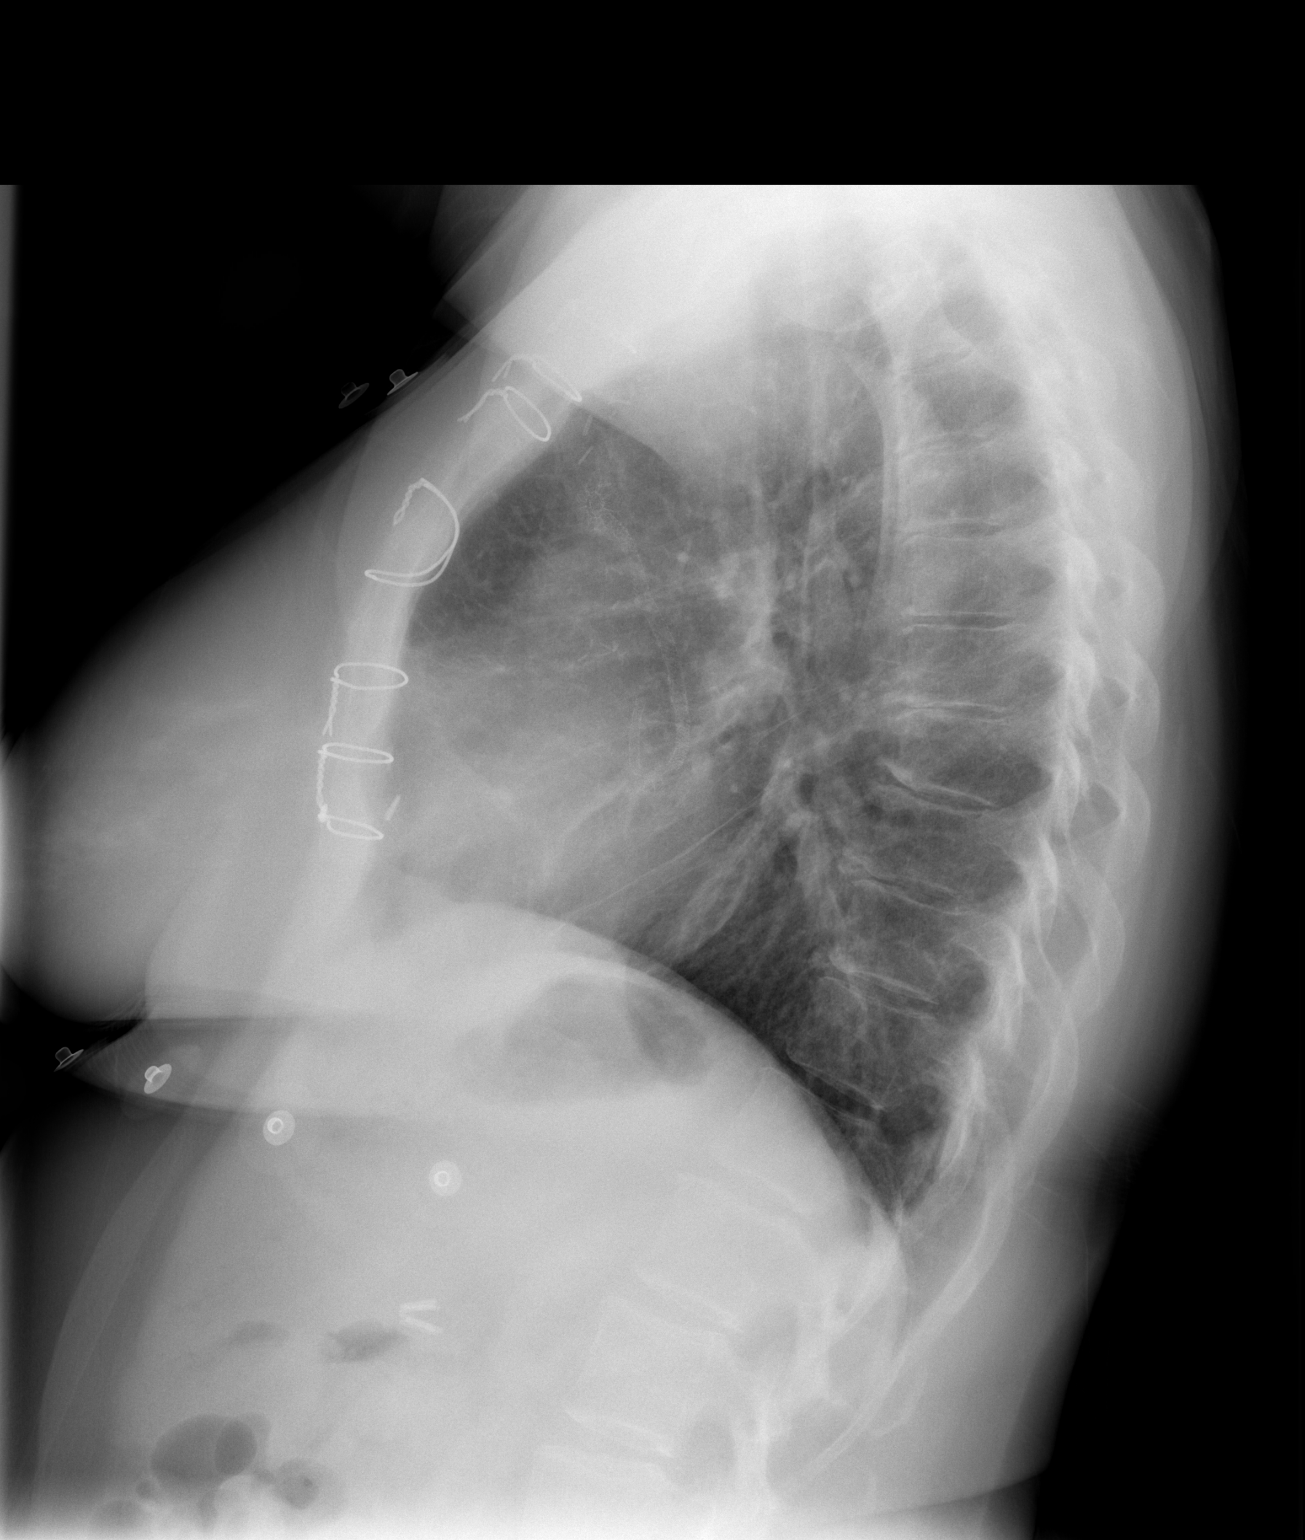

[2 of 2 positions shown; findings below may reference images not displayed]

FINDINGS: Lungs are essentially clear.  No focal consolidation. No
pleural effusion or pneumothorax.

Heart is normal in size. Postsurgical changes related to prior
CABG.

Degenerative changes of the visualized thoracolumbar spine.

Cholecystectomy clips.
IMPRESSION: No evidence of acute cardiopulmonary disease.

## 2013-09-28 ENCOUNTER — Other Ambulatory Visit: Payer: Medicare Other

## 2013-10-16 ENCOUNTER — Other Ambulatory Visit: Payer: Self-pay

## 2013-10-16 MED ORDER — METOPROLOL SUCCINATE ER 50 MG PO TB24: 50.0000 mg | ORAL_TABLET | Freq: Every day | ORAL | Status: DC

## 2013-10-19 ENCOUNTER — Other Ambulatory Visit: Payer: Self-pay

## 2013-10-19 DIAGNOSIS — M545 Low back pain, unspecified: Secondary | ICD-10-CM

## 2013-10-19 DIAGNOSIS — F419 Anxiety disorder, unspecified: Secondary | ICD-10-CM

## 2013-10-19 NOTE — Telephone Encounter (Signed)
Pt left v/m requesting rx alprazolam and hydrocodone apap. Call when ready for pick up. 

## 2013-10-20 ENCOUNTER — Other Ambulatory Visit: Payer: Self-pay

## 2013-10-20 MED ORDER — HYDROCODONE-ACETAMINOPHEN 10-325 MG PO TABS
1.0000 | ORAL_TABLET | ORAL | Status: DC | PRN
Start: 2013-10-20 — End: 2013-11-17

## 2013-10-20 MED ORDER — AMLODIPINE BESYLATE 5 MG PO TABS: 5.0000 mg | ORAL_TABLET | Freq: Every evening | ORAL | Status: DC

## 2013-10-20 MED ORDER — ALPRAZOLAM 1 MG PO TABS: 1.0000 mg | ORAL_TABLET | Freq: Three times a day (TID) | ORAL | Status: DC | PRN

## 2013-10-20 NOTE — Telephone Encounter (Signed)
Pt's numbers do not allow incoming calls--called pt's son and lmovm to have pt return my call--Rx is ready for pick up and she needs to update phone # if needed

## 2013-10-21 ENCOUNTER — Other Ambulatory Visit: Payer: Self-pay

## 2013-10-21 DIAGNOSIS — E039 Hypothyroidism, unspecified: Secondary | ICD-10-CM

## 2013-10-21 MED ORDER — METOPROLOL SUCCINATE ER 50 MG PO TB24: 50.0000 mg | ORAL_TABLET | Freq: Every day | ORAL | Status: DC

## 2013-10-21 MED ORDER — ROSUVASTATIN CALCIUM 20 MG PO TABS: 20.0000 mg | ORAL_TABLET | Freq: Every evening | ORAL | Status: DC

## 2013-10-21 MED ORDER — LEVOTHYROXINE SODIUM 25 MCG PO TABS
25.0000 ug | ORAL_TABLET | Freq: Every evening | ORAL | Status: DC
Start: 2013-10-21 — End: 2015-04-04

## 2013-10-21 NOTE — Telephone Encounter (Signed)
Pt left v/m requesting cb at [phone].

## 2013-10-21 NOTE — Telephone Encounter (Signed)
I have called her son's number 2 times this afternoon and lmovm--phone goes straight to VM and i cannot leave detailed msg on VM

## 2013-10-26 ENCOUNTER — Other Ambulatory Visit: Payer: Medicare Other

## 2013-10-27 ENCOUNTER — Other Ambulatory Visit: Payer: Self-pay

## 2013-10-27 MED ORDER — PANTOPRAZOLE SODIUM 40 MG PO TBEC: 40.0000 mg | DELAYED_RELEASE_TABLET | Freq: Every day | ORAL | Status: DC

## 2013-11-17 ENCOUNTER — Other Ambulatory Visit: Payer: Self-pay

## 2013-11-17 DIAGNOSIS — M545 Low back pain, unspecified: Secondary | ICD-10-CM

## 2013-11-17 DIAGNOSIS — F419 Anxiety disorder, unspecified: Secondary | ICD-10-CM

## 2013-11-17 MED ORDER — HYDROCODONE-ACETAMINOPHEN 10-325 MG PO TABS: 1.0000 | ORAL_TABLET | ORAL | Status: DC | PRN

## 2013-11-17 MED ORDER — ALPRAZOLAM 1 MG PO TABS: 1.0000 mg | ORAL_TABLET | Freq: Three times a day (TID) | ORAL | Status: DC | PRN

## 2013-11-17 NOTE — Telephone Encounter (Signed)
Pt left v/m requesting rx for alprazolam and hydrocodone apap. Pt does not have contact # and will cb later today to see when can pick up rx.

## 2013-11-17 NOTE — Telephone Encounter (Signed)
Rx left in front office for pick up 

## 2013-11-23 ENCOUNTER — Other Ambulatory Visit: Payer: Self-pay

## 2013-11-23 DIAGNOSIS — F419 Anxiety disorder, unspecified: Secondary | ICD-10-CM

## 2013-11-23 DIAGNOSIS — M545 Low back pain, unspecified: Secondary | ICD-10-CM

## 2013-11-23 MED ORDER — ALPRAZOLAM 1 MG PO TABS: 1.0000 mg | ORAL_TABLET | Freq: Three times a day (TID) | ORAL | Status: DC | PRN

## 2013-11-23 MED ORDER — HYDROCODONE-ACETAMINOPHEN 10-325 MG PO TABS: 1.0000 | ORAL_TABLET | ORAL | Status: DC | PRN

## 2013-11-23 NOTE — Telephone Encounter (Signed)
Called pt and pt's son listed in chart--both numbers were either an error message or stated the number was disconnected or changes--Rx will be in the front office ready for pick up--but pt needs to know if she loses Rx they cannot  be replaced

## 2013-11-23 NOTE — Telephone Encounter (Signed)
Pt left v/m; pt got alprazolam and hydrocodone apap filled 11/17/13; pt is going to leave soon for [place] and will be out of town for over one month; pt wants to know if can pick up printed rx for hydrocodone apap and alprazolam to take with her since she will not be back home before the 11/17/13 rx runs out. Pt request cb.

## 2013-11-23 NOTE — Telephone Encounter (Signed)
Yes, please put in there to be filled on or after 6/26. Also, if she loses the RX, we will not be able to replace

## 2013-11-24 ENCOUNTER — Telehealth: Payer: Self-pay | Admitting: Internal Medicine

## 2013-11-24 NOTE — Telephone Encounter (Signed)
Pt left v/m requesting refill to [facility] [phone] request dexilant and levothyroxine. Dr Martinique refilled levothyroxine 09/2013 and dexilant is not on current med list, pantoprazole is on current med list; left v/m for pt to cb.

## 2013-11-24 NOTE — Telephone Encounter (Signed)
Pt states she gets a colonoscopy and a endoscopy each year and she thinks it is near time for her to have this again this year.  Please let pt know if we can get this set up for the patient. Pt wants to have this done at [facility].

## 2013-11-24 NOTE — Telephone Encounter (Signed)
Call pt:  I have 2 questions:  Where has she been getting her previous colon screenings  Why does she have this done yearly

## 2013-11-25 ENCOUNTER — Other Ambulatory Visit: Payer: Self-pay | Admitting: Internal Medicine

## 2013-11-25 DIAGNOSIS — Z1211 Encounter for screening for malignant neoplasm of colon: Secondary | ICD-10-CM

## 2013-11-25 NOTE — Telephone Encounter (Signed)
Referral placed. Someone should contact her in the next few weeks for a appt

## 2013-11-25 NOTE — Telephone Encounter (Signed)
Pt states [facility]-- she was Dx with Mastocytic entercolitis and IBS--pt states she has family Hx of colon cancer and they have taken multiple samples--also have been seen in her stomach and esophagus most recent-- so they do it every year

## 2013-11-25 NOTE — Telephone Encounter (Signed)
Left v/m for pt to cb. 

## 2013-12-01 NOTE — Telephone Encounter (Signed)
Pt called back and Dr Martinique does fill pt's levothyroxine and pt said she is OK on all her refills now and does not need any refills.

## 2013-12-01 NOTE — Telephone Encounter (Signed)
Left v/m for pt to return call and mailed letter to pt.

## 2013-12-02 NOTE — Telephone Encounter (Signed)
Pt picked up Rx on 11/27/13 per her signature in the log

## 2013-12-09 ENCOUNTER — Telehealth: Payer: Self-pay | Admitting: Internal Medicine

## 2013-12-09 DIAGNOSIS — E039 Hypothyroidism, unspecified: Secondary | ICD-10-CM

## 2013-12-09 NOTE — Telephone Encounter (Signed)
This medication was filled 10/21/13 #90 with 3 refills by Dr Carollee Massed you still want me to send in Rx as she should have enough to take her to next year--please advise

## 2013-12-09 NOTE — Telephone Encounter (Signed)
Pt called and has changed her mind.  She would prefer to have [facility] refill her levothyroxine 25 mcg 1 tablet by mouth daily.  Pt would like 90 day supply called in to [facility] on [address], [place].  Best number to call pt is [phone]

## 2013-12-09 NOTE — Telephone Encounter (Signed)
Ok to refill synthroid

## 2013-12-09 NOTE — Telephone Encounter (Signed)
Don't need to send in now

## 2013-12-25 ENCOUNTER — Emergency Department (HOSPITAL_COMMUNITY): Payer: Medicare Other

## 2013-12-25 ENCOUNTER — Emergency Department (HOSPITAL_COMMUNITY)
Admission: EM | Admit: 2013-12-25 | Discharge: 2013-12-25 | Disposition: A | Discharge status: Home / Self Care | Payer: Medicare Other | Attending: Emergency Medicine | Admitting: Emergency Medicine

## 2013-12-25 ENCOUNTER — Encounter (HOSPITAL_COMMUNITY): Payer: Self-pay | Admitting: Emergency Medicine

## 2013-12-25 DIAGNOSIS — F411 Generalized anxiety disorder: Secondary | ICD-10-CM | POA: Insufficient documentation

## 2013-12-25 DIAGNOSIS — F329 Major depressive disorder, single episode, unspecified: Secondary | ICD-10-CM | POA: Diagnosis not present

## 2013-12-25 DIAGNOSIS — E039 Hypothyroidism, unspecified: Secondary | ICD-10-CM | POA: Diagnosis not present

## 2013-12-25 DIAGNOSIS — F17200 Nicotine dependence, unspecified, uncomplicated: Secondary | ICD-10-CM | POA: Insufficient documentation

## 2013-12-25 DIAGNOSIS — F3289 Other specified depressive episodes: Secondary | ICD-10-CM | POA: Insufficient documentation

## 2013-12-25 DIAGNOSIS — N390 Urinary tract infection, site not specified: Secondary | ICD-10-CM

## 2013-12-25 DIAGNOSIS — G43909 Migraine, unspecified, not intractable, without status migrainosus: Secondary | ICD-10-CM | POA: Insufficient documentation

## 2013-12-25 DIAGNOSIS — R079 Chest pain, unspecified: Secondary | ICD-10-CM | POA: Diagnosis not present

## 2013-12-25 DIAGNOSIS — Z79899 Other long term (current) drug therapy: Secondary | ICD-10-CM | POA: Diagnosis not present

## 2013-12-25 DIAGNOSIS — R0789 Other chest pain: Secondary | ICD-10-CM | POA: Diagnosis not present

## 2013-12-25 DIAGNOSIS — Z7901 Long term (current) use of anticoagulants: Secondary | ICD-10-CM | POA: Insufficient documentation

## 2013-12-25 DIAGNOSIS — N289 Disorder of kidney and ureter, unspecified: Secondary | ICD-10-CM

## 2013-12-25 DIAGNOSIS — I2510 Atherosclerotic heart disease of native coronary artery without angina pectoris: Secondary | ICD-10-CM | POA: Insufficient documentation

## 2013-12-25 DIAGNOSIS — K219 Gastro-esophageal reflux disease without esophagitis: Secondary | ICD-10-CM | POA: Diagnosis not present

## 2013-12-25 DIAGNOSIS — I10 Essential (primary) hypertension: Secondary | ICD-10-CM | POA: Diagnosis not present

## 2013-12-25 DIAGNOSIS — G44209 Tension-type headache, unspecified, not intractable: Secondary | ICD-10-CM

## 2013-12-25 DIAGNOSIS — Z862 Personal history of diseases of the blood and blood-forming organs and certain disorders involving the immune mechanism: Secondary | ICD-10-CM | POA: Diagnosis not present

## 2013-12-25 DIAGNOSIS — R51 Headache: Secondary | ICD-10-CM | POA: Insufficient documentation

## 2013-12-25 DIAGNOSIS — E785 Hyperlipidemia, unspecified: Secondary | ICD-10-CM | POA: Insufficient documentation

## 2013-12-25 LAB — COMPREHENSIVE METABOLIC PANEL
ALT: 13 U/L (ref 0–35)
AST: 14 U/L (ref 0–37)
Albumin: 4.4 g/dL (ref 3.5–5.2)
Alkaline Phosphatase: 53 U/L (ref 39–117)
Anion gap: 17 — ABNORMAL HIGH (ref 5–15)
BILIRUBIN TOTAL: 0.3 mg/dL (ref 0.3–1.2)
BUN: 22 mg/dL (ref 6–23)
CHLORIDE: 98 meq/L (ref 96–112)
CO2: 24 meq/L (ref 19–32)
Calcium: 9.5 mg/dL (ref 8.4–10.5)
Creatinine, Ser: 1.17 mg/dL — ABNORMAL HIGH (ref 0.50–1.10)
GFR calc Af Amer: 59 mL/min — ABNORMAL LOW (ref >90)
GFR calc non Af Amer: 51 mL/min — ABNORMAL LOW (ref >90)
Glucose, Bld: 112 mg/dL — ABNORMAL HIGH (ref 70–99)
POTASSIUM: 3.5 meq/L — AB (ref 3.7–5.3)
SODIUM: 139 meq/L (ref 137–147)
Total Protein: 7.3 g/dL (ref 6.0–8.3)

## 2013-12-25 LAB — CBC WITH DIFFERENTIAL/PLATELET
BASOS ABS: 0 10*3/uL (ref 0.0–0.1)
Basophils Relative: 0 % (ref 0–1)
Eosinophils Absolute: 0.1 10*3/uL (ref 0.0–0.7)
Eosinophils Relative: 1 % (ref 0–5)
HCT: 44.7 % (ref 36.0–46.0)
Hemoglobin: 15.5 g/dL — ABNORMAL HIGH (ref 12.0–15.0)
LYMPHS PCT: 21 % (ref 12–46)
Lymphs Abs: 2.8 10*3/uL (ref 0.7–4.0)
MCH: 32.3 pg (ref 26.0–34.0)
MCHC: 34.7 g/dL (ref 30.0–36.0)
MCV: 93.1 fL (ref 78.0–100.0)
Monocytes Absolute: 0.5 10*3/uL (ref 0.1–1.0)
Monocytes Relative: 4 % (ref 3–12)
NEUTROS ABS: 9.9 10*3/uL — AB (ref 1.7–7.7)
Neutrophils Relative %: 74 % (ref 43–77)
PLATELETS: 237 10*3/uL (ref 150–400)
RBC: 4.8 MIL/uL (ref 3.87–5.11)
RDW: 13.2 % (ref 11.5–15.5)
WBC: 13.4 10*3/uL — AB (ref 4.0–10.5)

## 2013-12-25 LAB — I-STAT CHEM 8, ED
BUN: 22 mg/dL (ref 6–23)
CALCIUM ION: 1.16 mmol/L (ref 1.12–1.23)
CREATININE: 1.3 mg/dL — AB (ref 0.50–1.10)
Chloride: 103 mEq/L (ref 96–112)
Glucose, Bld: 117 mg/dL — ABNORMAL HIGH (ref 70–99)
HCT: 49 % — ABNORMAL HIGH (ref 36.0–46.0)
Hemoglobin: 16.7 g/dL — ABNORMAL HIGH (ref 12.0–15.0)
Potassium: 3.4 mEq/L — ABNORMAL LOW (ref 3.7–5.3)
Sodium: 135 mEq/L — ABNORMAL LOW (ref 137–147)
TCO2: 24 mmol/L (ref 0–100)

## 2013-12-25 LAB — URINALYSIS, ROUTINE W REFLEX MICROSCOPIC
BILIRUBIN URINE: NEGATIVE
Glucose, UA: NEGATIVE mg/dL
Ketones, ur: NEGATIVE mg/dL
NITRITE: POSITIVE — AB
PH: 5 (ref 5.0–8.0)
Protein, ur: 30 mg/dL — AB
Specific Gravity, Urine: 1.031 — ABNORMAL HIGH (ref 1.005–1.030)
Urobilinogen, UA: 0.2 mg/dL (ref 0.0–1.0)

## 2013-12-25 LAB — URINE MICROSCOPIC-ADD ON

## 2013-12-25 LAB — I-STAT TROPONIN, ED
Troponin i, poc: 0 ng/mL (ref 0.00–0.08)
Troponin i, poc: 0.01 ng/mL (ref 0.00–0.08)

## 2013-12-25 LAB — SEDIMENTATION RATE: SED RATE: 5 mm/h (ref 0–22)

## 2013-12-25 LAB — I-STAT CG4 LACTIC ACID, ED: Lactic Acid, Venous: 2.04 mmol/L (ref 0.5–2.2)

## 2013-12-25 MED ORDER — SODIUM CHLORIDE 0.9 % IV BOLUS (SEPSIS)
1000.0000 mL | Freq: Once | INTRAVENOUS | Status: AC
  Administered 2013-12-25: 1000 mL via INTRAVENOUS

## 2013-12-25 MED ORDER — CEPHALEXIN 500 MG PO CAPS: 500.0000 mg | ORAL_CAPSULE | Freq: Four times a day (QID) | ORAL | Status: DC

## 2013-12-25 MED ORDER — SODIUM CHLORIDE 0.9 % IV SOLN
1000.0000 mL | INTRAVENOUS | Status: DC
  Administered 2013-12-25: 1000 mL via INTRAVENOUS

## 2013-12-25 MED ORDER — DEXTROSE 5 % IV SOLN
1.0000 g | Freq: Once | INTRAVENOUS | Status: AC
  Administered 2013-12-25: 1 g via INTRAVENOUS
  Filled 2013-12-25: qty 10

## 2013-12-25 MED ORDER — ONDANSETRON HCL 4 MG/2ML IJ SOLN
4.0000 mg | Freq: Once | INTRAMUSCULAR | Status: AC
  Administered 2013-12-25: 4 mg via INTRAVENOUS
  Filled 2013-12-25: qty 2

## 2013-12-25 MED ORDER — METOCLOPRAMIDE HCL 10 MG PO TABS: 10.0000 mg | ORAL_TABLET | Freq: Four times a day (QID) | ORAL | Status: DC | PRN

## 2013-12-25 MED ORDER — KETOROLAC TROMETHAMINE 30 MG/ML IJ SOLN
30.0000 mg | Freq: Once | INTRAMUSCULAR | Status: AC
  Administered 2013-12-25: 30 mg via INTRAVENOUS
  Filled 2013-12-25: qty 1

## 2013-12-25 MED ORDER — METOCLOPRAMIDE HCL 5 MG/ML IJ SOLN
10.0000 mg | Freq: Once | INTRAMUSCULAR | Status: AC
  Administered 2013-12-25: 10 mg via INTRAVENOUS
  Filled 2013-12-25: qty 2

## 2013-12-25 MED ORDER — DIPHENHYDRAMINE HCL 50 MG/ML IJ SOLN
25.0000 mg | Freq: Once | INTRAMUSCULAR | Status: AC
  Administered 2013-12-25: 25 mg via INTRAVENOUS
  Filled 2013-12-25: qty 1

## 2013-12-25 MED ORDER — SODIUM CHLORIDE 0.9 % IV SOLN
1000.0000 mL | Freq: Once | INTRAVENOUS | Status: AC
  Administered 2013-12-25: 1000 mL via INTRAVENOUS

## 2013-12-25 NOTE — ED Notes (Signed)
Notified EDP of pt's blood pressure.

## 2013-12-25 NOTE — Discharge Instructions (Signed)
Followup with your cardiologist to see if it is time to have another stress Myoview study.  Your creatinine (blood tests of your kidney) has risen over what it had been previously. Please followup with your PCP to recheck this test in one week. In the meantime, drink plenty of fluids.  Your urine has been sent for culture. If culture shows he need to be on a different antibiotic, we will contact you. Return to the emergency department if symptoms are getting worse.  Tension Headache A tension headache is a feeling of pain, pressure, or aching often felt over the front and sides of the head. The pain can be dull or can feel tight (constricting). It is the most common type of headache. Tension headaches are not normally associated with nausea or vomiting and do not get worse with physical activity. Tension headaches can last 30 minutes to several days.  CAUSES  The exact cause is not known, but it may be caused by chemicals and hormones in the brain that lead to pain. Tension headaches often begin after stress, anxiety, or depression. Other triggers may include:  Alcohol.  Caffeine (too much or withdrawal).  Respiratory infections (colds, flu, sinus infections).  Dental problems or teeth clenching.  Fatigue.  Holding your head and neck in one position too long while using a computer. SYMPTOMS   Pressure around the head.   Dull, aching head pain.   Pain felt over the front and sides of the head.   Tenderness in the muscles of the head, neck, and shoulders. DIAGNOSIS  A tension headache is often diagnosed based on:   Symptoms.   Physical examination.   A CT scan or MRI of your head. These tests may be ordered if symptoms are severe or unusual. TREATMENT  Medicines may be given to help relieve symptoms.  HOME CARE INSTRUCTIONS   Only take over-the-counter or prescription medicines for pain or discomfort as directed by your caregiver.   Lie down in a dark, quiet room when  you have a headache.   Keep a journal to find out what may be triggering your headaches. For example, write down:  What you eat and drink.  How much sleep you get.  Any change to your diet or medicines.  Try massage or other relaxation techniques.   Ice packs or heat applied to the head and neck can be used. Use these 3 to 4 times per day for 15 to 20 minutes each time, or as needed.   Limit stress.   Sit up straight, and do not tense your muscles.   Quit smoking if you smoke.  Limit alcohol use.  Decrease the amount of caffeine you drink, or stop drinking caffeine.  Eat and exercise regularly.  Get 7 to 9 hours of sleep, or as recommended by your caregiver.  Avoid excessive use of pain medicine as recurrent headaches can occur.  SEEK MEDICAL CARE IF:   You have problems with the medicines you were prescribed.  Your medicines do not work.  You have a change from the usual headache.  You have nausea or vomiting. SEEK IMMEDIATE MEDICAL CARE IF:   Your headache becomes severe.  You have a fever.  You have a stiff neck.  You have loss of vision.  You have muscular weakness or loss of muscle control.  You lose your balance or have trouble walking.  You feel faint or pass out.  You have severe symptoms that are different from your first symptoms.  MAKE SURE YOU:   Understand these instructions.  Will watch your condition.  Will get help right away if you are not doing well or get worse. Document Released: 06/11/2005 Document Revised: 09/03/2011 Document Reviewed: 06/01/2011 [facility] Patient Information 2015 [place], [place]. This information is not intended to replace advice given to you by your health care provider. Make sure you discuss any questions you have with your health care provider.  Urinary Tract Infection Urinary tract infections (UTIs) can develop anywhere along your urinary tract. Your urinary tract is your body's drainage system for  removing wastes and extra water. Your urinary tract includes two kidneys, two ureters, a bladder, and a urethra. Your kidneys are a pair of bean-shaped organs. Each kidney is about the size of your fist. They are located below your ribs, one on each side of your spine. CAUSES Infections are caused by microbes, which are microscopic organisms, including fungi, viruses, and bacteria. These organisms are so small that they can only be seen through a microscope. Bacteria are the microbes that most commonly cause UTIs. SYMPTOMS  Symptoms of UTIs may vary by age and gender of the patient and by the location of the infection. Symptoms in young women typically include a frequent and intense urge to urinate and a painful, burning feeling in the bladder or urethra during urination. Older women and men are more likely to be tired, shaky, and weak and have muscle aches and abdominal pain. A fever may mean the infection is in your kidneys. Other symptoms of a kidney infection include pain in your back or sides below the ribs, nausea, and vomiting. DIAGNOSIS To diagnose a UTI, your caregiver will ask you about your symptoms. Your caregiver also will ask to provide a urine sample. The urine sample will be tested for bacteria and white blood cells. White blood cells are made by your body to help fight infection. TREATMENT  Typically, UTIs can be treated with medication. Because most UTIs are caused by a bacterial infection, they usually can be treated with the use of antibiotics. The choice of antibiotic and length of treatment depend on your symptoms and the type of bacteria causing your infection. HOME CARE INSTRUCTIONS  If you were prescribed antibiotics, take them exactly as your caregiver instructs you. Finish the medication even if you feel better after you have only taken some of the medication.  Drink enough water and fluids to keep your urine clear or pale yellow.  Avoid caffeine, tea, and carbonated  beverages. They tend to irritate your bladder.  Empty your bladder often. Avoid holding urine for long periods of time.  Empty your bladder before and after sexual intercourse.  After a bowel movement, women should cleanse from front to back. Use each tissue only once. SEEK MEDICAL CARE IF:   You have back pain.  You develop a fever.  Your symptoms do not begin to resolve within 3 days. SEEK IMMEDIATE MEDICAL CARE IF:   You have severe back pain or lower abdominal pain.  You develop chills.  You have nausea or vomiting.  You have continued burning or discomfort with urination. MAKE SURE YOU:   Understand these instructions.  Will watch your condition.  Will get help right away if you are not doing well or get worse. Document Released: 03/21/2005 Document Revised: 12/11/2011 Document Reviewed: 07/20/2011 [facility] Patient Information 2015 [place], [place]. This information is not intended to replace advice given to you by your health care provider. Make sure you discuss any questions  you have with your health care provider.  Chest Pain (Nonspecific) It is often hard to give a specific diagnosis for the cause of chest pain. There is always a chance that your pain could be related to something serious, such as a heart attack or a blood clot in the lungs. You need to follow up with your health care provider for further evaluation. CAUSES   Heartburn.  Pneumonia or bronchitis.  Anxiety or stress.  Inflammation around your heart (pericarditis) or lung (pleuritis or pleurisy).  A blood clot in the lung.  A collapsed lung (pneumothorax). It can develop suddenly on its own (spontaneous pneumothorax) or from trauma to the chest.  Shingles infection (herpes zoster virus). The chest wall is composed of bones, muscles, and cartilage. Any of these can be the source of the pain.  The bones can be bruised by injury.  The muscles or cartilage can be strained by coughing or  overwork.  The cartilage can be affected by inflammation and become sore (costochondritis). DIAGNOSIS  Lab tests or other studies may be needed to find the cause of your pain. Your health care provider may have you take a test called an ambulatory electrocardiogram (ECG). An ECG records your heartbeat patterns over a 24-hour period. You may also have other tests, such as:  Transthoracic echocardiogram (TTE). During echocardiography, sound waves are used to evaluate how blood flows through your heart.  Transesophageal echocardiogram (TEE).  Cardiac monitoring. This allows your health care provider to monitor your heart rate and rhythm in real time.  Holter monitor. This is a portable device that records your heartbeat and can help diagnose heart arrhythmias. It allows your health care provider to track your heart activity for several days, if needed.  Stress tests by exercise or by giving medicine that makes the heart beat faster. TREATMENT   Treatment depends on what may be causing your chest pain. Treatment may include:  Acid blockers for heartburn.  Anti-inflammatory medicine.  Pain medicine for inflammatory conditions.  Antibiotics if an infection is present.  You may be advised to change lifestyle habits. This includes stopping smoking and avoiding alcohol, caffeine, and chocolate.  You may be advised to keep your head raised (elevated) when sleeping. This reduces the chance of acid going backward from your stomach into your esophagus. Most of the time, nonspecific chest pain will improve within 2-3 days with rest and mild pain medicine.  HOME CARE INSTRUCTIONS   If antibiotics were prescribed, take them as directed. Finish them even if you start to feel better.  For the next few days, avoid physical activities that bring on chest pain. Continue physical activities as directed.  Do not use any tobacco products, including cigarettes, chewing tobacco, or electronic  cigarettes.  Avoid drinking alcohol.  Only take medicine as directed by your health care provider.  Follow your health care provider's suggestions for further testing if your chest pain does not go away.  Keep any follow-up appointments you made. If you do not go to an appointment, you could develop lasting (chronic) problems with pain. If there is any problem keeping an appointment, call to reschedule. SEEK MEDICAL CARE IF:   Your chest pain does not go away, even after treatment.  You have a rash with blisters on your chest.  You have a fever. SEEK IMMEDIATE MEDICAL CARE IF:   You have increased chest pain or pain that spreads to your arm, neck, jaw, back, or abdomen.  You have shortness of breath.  You have an increasing cough, or you cough up blood.  You have severe back or abdominal pain.  You feel nauseous or vomit.  You have severe weakness.  You faint.  You have chills. This is an emergency. Do not wait to see if the pain will go away. Get medical help at once. Call your local emergency services (911 in [place] Do not drive yourself to the hospital. MAKE SURE YOU:   Understand these instructions.  Will watch your condition.  Will get help right away if you are not doing well or get worse. Document Released: 03/21/2005 Document Revised: 06/16/2013 Document Reviewed: 01/15/2008 [facility] Patient Information 2015 [place], [place]. This information is not intended to replace advice given to you by your health care provider. Make sure you discuss any questions you have with your health care provider.  Metoclopramide tablets What is this medicine? METOCLOPRAMIDE (met oh kloe PRA mide) is used to treat the symptoms of gastroesophageal reflux disease (GERD) like heartburn. It is also used to treat people with slow emptying of the stomach and intestinal tract. This medicine may be used for other purposes; ask your health care provider or pharmacist if you have  questions. COMMON BRAND NAME(S): Reglan What should I tell my health care provider before I take this medicine? They need to know if you have any of these conditions: -breast cancer -depression -diabetes -heart failure -high blood pressure -kidney disease -liver disease -Parkinson's disease or a movement disorder -pheochromocytoma -seizures -stomach obstruction, bleeding, or perforation -an unusual or allergic reaction to metoclopramide, procainamide, sulfites, other medicines, foods, dyes, or preservatives -pregnant or trying to get pregnant -breast-feeding How should I use this medicine? Take this medicine by mouth with a glass of water. Follow the directions on the prescription label. Take this medicine on an empty stomach, about 30 minutes before eating. Take your doses at regular intervals. Do not take your medicine more often than directed. Do not stop taking except on the advice of your doctor or health care professional. A special MedGuide will be given to you by the pharmacist with each prescription and refill. Be sure to read this information carefully each time. Talk to your pediatrician regarding the use of this medicine in children. Special care may be needed. Overdosage: If you think you have taken too much of this medicine contact a poison control center or emergency room at once. NOTE: This medicine is only for you. Do not share this medicine with others. What if I miss a dose? If you miss a dose, take it as soon as you can. If it is almost time for your next dose, take only that dose. Do not take double or extra doses. What may interact with this medicine? -acetaminophen -cyclosporine -digoxin -medicines for blood pressure -medicines for diabetes, including insulin -medicines for hay fever and other allergies -medicines for depression, especially an Monoamine Oxidase Inhibitor (MAOI) -medicines for Parkinson's disease, like levodopa -medicines for sleep or for  pain -tetracycline This list may not describe all possible interactions. Give your health care provider a list of all the medicines, herbs, non-prescription drugs, or dietary supplements you use. Also tell them if you smoke, drink alcohol, or use illegal drugs. Some items may interact with your medicine. What should I watch for while using this medicine? It may take a few weeks for your stomach condition to start to get better. However, do not take this medicine for longer than 12 weeks. The longer you take this medicine, and the more  you take it, the greater your chances are of developing serious side effects. If you are an elderly patient, a female patient, or you have diabetes, you may be at an increased risk for side effects from this medicine. Contact your doctor immediately if you start having movements you cannot control such as lip smacking, rapid movements of the tongue, involuntary or uncontrollable movements of the eyes, head, arms and legs, or muscle twitches and spasms. Patients and their families should watch out for worsening depression or thoughts of suicide. Also watch out for any sudden or severe changes in feelings such as feeling anxious, agitated, panicky, irritable, hostile, aggressive, impulsive, severely restless, overly excited and hyperactive, or not being able to sleep. If this happens, especially at the beginning of treatment or after a change in dose, call your doctor. Do not treat yourself for high fever. Ask your doctor or health care professional for advice. You may get drowsy or dizzy. Do not drive, use machinery, or do anything that needs mental alertness until you know how this drug affects you. Do not stand or sit up quickly, especially if you are an older patient. This reduces the risk of dizzy or fainting spells. Alcohol can make you more drowsy and dizzy. Avoid alcoholic drinks. What side effects may I notice from receiving this medicine? Side effects that you should  report to your doctor or health care professional as soon as possible: -allergic reactions like skin rash, itching or hives, swelling of the face, lips, or tongue -abnormal production of milk in females -breast enlargement in both males and females -change in the way you walk -difficulty moving, speaking or swallowing -drooling, lip smacking, or rapid movements of the tongue -excessive sweating -fever -involuntary or uncontrollable movements of the eyes, head, arms and legs -irregular heartbeat or palpitations -muscle twitches and spasms -unusually weak or tired Side effects that usually do not require medical attention (report to your doctor or health care professional if they continue or are bothersome): -change in sex drive or performance -depressed mood -diarrhea -difficulty sleeping -headache -menstrual changes -restless or nervous This list may not describe all possible side effects. Call your doctor for medical advice about side effects. You may report side effects to FDA at 1-800-FDA-1088. Where should I keep my medicine? Keep out of the reach of children. Store at room temperature between 20 and 25 degrees C (68 and 77 degrees F). Protect from light. Keep container tightly closed. Throw away any unused medicine after the expiration date. NOTE: This sheet is a summary. It may not cover all possible information. If you have questions about this medicine, talk to your doctor, pharmacist, or health care provider.  2015, [employer]. (2011-10-09 13:04:38)  Cephalexin tablets or capsules What is this medicine? CEPHALEXIN (sef a LEX in) is a cephalosporin antibiotic. It is used to treat certain kinds of bacterial infections It will not work for colds, flu, or other viral infections. This medicine may be used for other purposes; ask your health care provider or pharmacist if you have questions. COMMON BRAND NAME(S): Biocef, Keflex, Keftab What should I tell my health  care provider before I take this medicine? They need to know if you have any of these conditions: -kidney disease -stomach or intestine problems, especially colitis -an unusual or allergic reaction to cephalexin, other cephalosporins, penicillins, other antibiotics, medicines, foods, dyes or preservatives -pregnant or trying to get pregnant -breast-feeding How should I use this medicine? Take this medicine by mouth with a full  glass of water. Follow the directions on the prescription label. This medicine can be taken with or without food. Take your medicine at regular intervals. Do not take your medicine more often than directed. Take all of your medicine as directed even if you think you are better. Do not skip doses or stop your medicine early. Talk to your pediatrician regarding the use of this medicine in children. While this drug may be prescribed for selected conditions, precautions do apply. Overdosage: If you think you have taken too much of this medicine contact a poison control center or emergency room at once. NOTE: This medicine is only for you. Do not share this medicine with others. What if I miss a dose? If you miss a dose, take it as soon as you can. If it is almost time for your next dose, take only that dose. Do not take double or extra doses. There should be at least 4 to 6 hours between doses. What may interact with this medicine? -probenecid -some other antibiotics This list may not describe all possible interactions. Give your health care provider a list of all the medicines, herbs, non-prescription drugs, or dietary supplements you use. Also tell them if you smoke, drink alcohol, or use illegal drugs. Some items may interact with your medicine. What should I watch for while using this medicine? Tell your doctor or health care professional if your symptoms do not begin to improve in a few days. Do not treat diarrhea with over the counter products. Contact your doctor if you  have diarrhea that lasts more than 2 days or if it is severe and watery. If you have diabetes, you may get a false-positive result for sugar in your urine. Check with your doctor or health care professional. What side effects may I notice from receiving this medicine? Side effects that you should report to your doctor or health care professional as soon as possible: -allergic reactions like skin rash, itching or hives, swelling of the face, lips, or tongue -breathing problems -pain or trouble passing urine -redness, blistering, peeling or loosening of the skin, including inside the mouth -severe or watery diarrhea -unusually weak or tired -yellowing of the eyes, skin Side effects that usually do not require medical attention (report to your doctor or health care professional if they continue or are bothersome): -gas or heartburn -genital or anal irritation -headache -joint or muscle pain -nausea, vomiting This list may not describe all possible side effects. Call your doctor for medical advice about side effects. You may report side effects to FDA at 1-800-FDA-1088. Where should I keep my medicine? Keep out of the reach of children. Store at room temperature between 59 and 86 degrees F (15 and 30 degrees C). Throw away any unused medicine after the expiration date. NOTE: This sheet is a summary. It may not cover all possible information. If you have questions about this medicine, talk to your doctor, pharmacist, or health care provider.  2015, [employer]. (2007-09-15 17:09:13)

## 2013-12-25 NOTE — ED Notes (Signed)
Pt. Stated, I started having chest pain and a headache for 5 days.  i took 5 nitroclycerin 5 days. In between the days before today I was having neck, back, shoulder and a headache pain.  Today the pain is horrible and I've taken 2 Nitro 45 minutes ago.  Its still hurting a little bit.  Its my chest and my head.

## 2013-12-25 NOTE — ED Notes (Signed)
MD at bedside. 

## 2013-12-25 NOTE — ED Provider Notes (Signed)
CSN: [id number]     Arrival date & time 12/25/13  1332 History   First MD Initiated Contact with Patient 12/25/13 1349     Chief Complaint  Patient presents with  . Chest Pain     (Consider location/radiation/quality/duration/timing/severity/associated sxs/prior Treatment) Patient is a 58 y.o. female presenting with chest pain. The history is provided by the patient.  Chest Pain She has been having a right hemicranial headache for the last 5 days. Headache is throbbing and she rates it 8/10, although it has been as severe as 10/10. Headache is worse when she bends forward and when she is exposed to loud noises or bright light. There has been some associated nausea and vomiting. She denies visual change, weakness, numbness, tingling. She has not had headaches like this before. She has taken hydrocodone-acetaminophen for her with no relief of pain. She has also been having episodes of chest pain and today, she had 3 episodes of pain for which she took nitroglycerin. She is currently chest pain-free. Chest pain is described as sharp and achy feeling in the central chest without radiation. There is no associated dyspnea, nausea., diaphoresis. She is status post coronary artery bypass and multiple stent placements. She has chest pain on a fairly chronic basis. She also had chest pain requiring nitroglycerin 5 days ago when her headache started. She's been having mild episodes of chest pain on a daily basis. Chest pain today came on while at rest. She also has a history of your double bowel syndrome and states that she has been having frequent bowel movements and thinks she might be somewhat dehydrated.  Past Medical History  Diagnosis Date  . Chest pain, cardiac     Has chronic chest pain.   . Coronary artery disease     a) s/p CABG x 2 in [place] 2002. b)stenting to the SVG-OM in 2009. c)DES to the saphenous vein graft to the ramus in March 2012. d) s/p PTCA/DES to native ramus intermedius in  September 2012. Last cath 03/2011 with minimal nonobstructive disease  e) indeterminant myoview 09/2011 but atypical CP & no perfusion defect in ramus intermediate distribution, EF 52%  . Hypertension   . Hyperlipidemia   . Tobacco abuse   . Hypothyroidism   . Hx: UTI (urinary tract infection)     multiple  . Enterocolitis   . Gastroparesis   . Complication of anesthesia     "my blood pressure will drop out on me"  . 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 Anemia   . Blood transfusion   . GERD (gastroesophageal reflux disease)   . Hematuria 09/25/11    "lately"  . GI bleeding   . Anxiety   . Depression   . PTSD (post-traumatic stress disorder)   . Migraines     h/o   Past Surgical History  Procedure Laterality Date  . Vesicovaginal fistula closure w/ tah  1983  . Tmj arthroplasty  1980's    right side; "I've had to have it twice"  . Coronary artery bypass graft  2002    CABG X2  . Coronary angioplasty with stent placement      "lots; they've all collapsed; last time dr took native vein &  turned it around & put stents in it before attaching"  . Abdominal hysterectomy  1980's  . Salpingoophorectomy  1980's    "2 surgeries; 1st right; then left"  . Biopsy stomach  09/25/11    "  several in the last year; all negative; via endoscopy"  . Pilonidal cyst / sinus excision  1980's    "was wrapped around my spinal cord; Dr. Norville Haggard got almost all of it except a tiny bit; said it was really deep; packed fatty tissue in the hole; really bothers me alot"  . Cholecystectomy  2000's  . Breast surgery      right biopsy  . Cysto with hydrodistension  03/28/2012    Procedure: CYSTOSCOPY/HYDRODISTENSION;  Surgeon: Reece Packer, MD;  Location: [facility];  Service: Urology;  Laterality: N/A;  . Cystoscopy with injection  03/28/2012    Procedure: CYSTOSCOPY WITH INJECTION;  Surgeon: Reece Packer, MD;  Location: [facility];  Service: Urology;  Laterality: N/A;  Marcaine  and Pyridium  . Cystoscopy w/ retrogrades  03/28/2012    Procedure: CYSTOSCOPY WITH RETROGRADE PYELOGRAM;  Surgeon: Reece Packer, MD;  Location: [facility];  Service: Urology;  Laterality: Bilateral;   Family History  Problem Relation Age of Onset  . Heart disease Father   . Emphysema Mother     smoked  . Asthma Mother   . Heart disease Paternal Grandfather   . Breast cancer Mother    History  Substance Use Topics  . Smoking status: Current Every Day Smoker -- 0.25 packs/day for 15 years    Types: Cigarettes  . Smokeless tobacco: Never Used  . Alcohol Use: No   OB History   Grav Para Term Preterm Abortions TAB SAB Ect Mult Living                 Review of Systems  Cardiovascular: Positive for chest pain.  All other systems reviewed and are negative.     Allergies  Morphine and related; Ranitidine; and Sulfa antibiotics  Home Medications   Prior to Admission medications   Medication Sig Start Date End Date Taking? Authorizing Provider  ALPRAZolam Duanne Moron) 1 MG tablet Take 1 tablet (1 mg total) by mouth 3 (three) times daily as needed for sleep. 11/23/13  Yes [name], NP  amLODipine (NORVASC) 5 MG tablet Take 1 tablet (5 mg total) by mouth every evening. 10/20/13  Yes Peter M Martinique, MD  clopidogrel (PLAVIX) 75 MG tablet Take 1 tablet (75 mg total) by mouth every evening. 08/24/13  Yes Peter M Martinique, MD  dexlansoprazole (DEXILANT) 60 MG capsule Take 60 mg by mouth daily.   Yes Historical Provider, MD  diphenhydramine-acetaminophen (TYLENOL PM) 25-500 MG TABS Take 1 tablet by mouth at bedtime as needed (pain, sleep).   Yes Historical Provider, MD  HYDROcodone-acetaminophen (NORCO) 10-325 MG per tablet Take 1 tablet by mouth every 4 (four) hours as needed. 11/23/13  Yes [name], NP  levothyroxine (SYNTHROID, LEVOTHROID) 25 MCG tablet Take 1 tablet (25 mcg total) by mouth every evening. 10/21/13  Yes Peter M Martinique, MD  metoprolol succinate (TOPROL-XL) 50 MG 24 hr tablet  Take 1 tablet (50 mg total) by mouth daily. Take with or immediately following a meal. 10/21/13  Yes Peter M Martinique, MD  nitroGLYCERIN (NITROSTAT) 0.4 MG SL tablet Place 1 tablet (0.4 mg total) under the tongue every 5 (five) minutes as needed (up to 3 doses). For chest pain 06/04/12  Yes Peter M Martinique, MD  promethazine (PHENERGAN) 25 MG tablet Take 1 tablet (25 mg total) by mouth every 6 (six) hours as needed. For nausea 08/18/13  Yes [name], NP  rosuvastatin (CRESTOR) 20 MG tablet Take 1 tablet (20 mg total) by  mouth every evening. 10/21/13  Yes Peter M Martinique, MD   BP 91/44  Pulse 92  Temp(Src) 98.5 F (36.9 C) (Oral)  Resp 21  SpO2 95% Physical Exam  Nursing note and vitals reviewed.  58 year old female, resting comfortably and in no acute distress. Vital signs are significant for borderline tachypnea with respiratory rate of 21, and borderline hypertension with blood pressure 91/44. Oxygen saturation is 95%, which is normal. Head is normocephalic and atraumatic. PERRLA, EOMI. Oropharynx is clear. Fundi show no hemorrhage, exudate, or papilledema. There is tenderness palpation over the temporalis muscle on the right, and also at the insertion of the right paracervical muscles. Neck is supple without adenopathy or JVD. There is mild spasm of the right paracervical muscles. Back is nontender and there is no CVA tenderness. Lungs are clear without rales, wheezes, or rhonchi. Chest is nontender. Heart has regular rate and rhythm without murmur. Abdomen is soft, flat, nontender without masses or hepatosplenomegaly and peristalsis is normoactive. Extremities have no cyanosis or edema, full range of motion is present. Skin is warm and dry without rash. Neurologic: Mental status is normal, cranial nerves are intact, there are no motor or sensory deficits.  ED Course  Procedures (including critical care time) Labs Review Results for orders placed during the hospital encounter of 12/25/13   CBC WITH DIFFERENTIAL      Result Value Ref Range   WBC 13.4 (*) 4.0 - 10.5 K/uL   RBC 4.80  3.87 - 5.11 MIL/uL   Hemoglobin 15.5 (*) 12.0 - 15.0 g/dL   HCT 44.7  36.0 - 46.0 %   MCV 93.1  78.0 - 100.0 fL   MCH 32.3  26.0 - 34.0 pg   MCHC 34.7  30.0 - 36.0 g/dL   RDW 13.2  11.5 - 15.5 %   Platelets 237  150 - 400 K/uL   Neutrophils Relative % 74  43 - 77 %   Neutro Abs 9.9 (*) 1.7 - 7.7 K/uL   Lymphocytes Relative 21  12 - 46 %   Lymphs Abs 2.8  0.7 - 4.0 K/uL   Monocytes Relative 4  3 - 12 %   Monocytes Absolute 0.5  0.1 - 1.0 K/uL   Eosinophils Relative 1  0 - 5 %   Eosinophils Absolute 0.1  0.0 - 0.7 K/uL   Basophils Relative 0  0 - 1 %   Basophils Absolute 0.0  0.0 - 0.1 K/uL  COMPREHENSIVE METABOLIC PANEL      Result Value Ref Range   Sodium 139  137 - 147 mEq/L   Potassium 3.5 (*) 3.7 - 5.3 mEq/L   Chloride 98  96 - 112 mEq/L   CO2 24  19 - 32 mEq/L   Glucose, Bld 112 (*) 70 - 99 mg/dL   BUN 22  6 - 23 mg/dL   Creatinine, Ser 1.17 (*) 0.50 - 1.10 mg/dL   Calcium 9.5  8.4 - 10.5 mg/dL   Total Protein 7.3  6.0 - 8.3 g/dL   Albumin 4.4  3.5 - 5.2 g/dL   AST 14  0 - 37 U/L   ALT 13  0 - 35 U/L   Alkaline Phosphatase 53  39 - 117 U/L   Total Bilirubin 0.3  0.3 - 1.2 mg/dL   GFR calc non Af Amer 51 (*) >90 mL/min   GFR calc Af Amer 59 (*) >90 mL/min   Anion gap 17 (*) 5 - 15  SEDIMENTATION RATE  Result Value Ref Range   Sed Rate 5  0 - 22 mm/hr  URINALYSIS, ROUTINE W REFLEX MICROSCOPIC      Result Value Ref Range   Color, Urine YELLOW  YELLOW   APPearance CLOUDY (*) CLEAR   Specific Gravity, Urine 1.031 (*) 1.005 - 1.030   pH 5.0  5.0 - 8.0   Glucose, UA NEGATIVE  NEGATIVE mg/dL   Hgb urine dipstick MODERATE (*) NEGATIVE   Bilirubin Urine NEGATIVE  NEGATIVE   Ketones, ur NEGATIVE  NEGATIVE mg/dL   Protein, ur 30 (*) NEGATIVE mg/dL   Urobilinogen, UA 0.2  0.0 - 1.0 mg/dL   Nitrite POSITIVE (*) NEGATIVE   Leukocytes, UA SMALL (*) NEGATIVE  URINE  MICROSCOPIC-ADD ON      Result Value Ref Range   Squamous Epithelial / LPF MANY (*) RARE   WBC, UA TOO NUMEROUS TO COUNT  <3 WBC/hpf   RBC / HPF 3-6  <3 RBC/hpf   Bacteria, UA MANY (*) RARE  I-STAT CHEM 8, ED      Result Value Ref Range   Sodium 135 (*) 137 - 147 mEq/L   Potassium 3.4 (*) 3.7 - 5.3 mEq/L   Chloride 103  96 - 112 mEq/L   BUN 22  6 - 23 mg/dL   Creatinine, Ser 1.30 (*) 0.50 - 1.10 mg/dL   Glucose, Bld 117 (*) 70 - 99 mg/dL   Calcium, Ion 1.16  1.12 - 1.23 mmol/L   TCO2 24  0 - 100 mmol/L   Hemoglobin 16.7 (*) 12.0 - 15.0 g/dL   HCT 49.0 (*) 36.0 - 46.0 %  I-STAT TROPOININ, ED      Result Value Ref Range   Troponin i, poc 0.00  0.00 - 0.08 ng/mL   Comment 3           I-STAT CG4 LACTIC ACID, ED      Result Value Ref Range   Lactic Acid, Venous 2.04  0.5 - 2.2 mmol/L  I-STAT TROPOININ, ED      Result Value Ref Range   Troponin i, poc 0.01  0.00 - 0.08 ng/mL   Comment 3            Imaging Review Dg Chest Portable 1 View  12/25/2013   CLINICAL DATA:  Chest pain and congestion with history of coronary artery disease and CABG and stent placement  EXAM: PORTABLE CHEST - 1 VIEW  COMPARISON:  PA and lateral chest of June 03, 2013  FINDINGS: The lungs are adequately inflated. The heart and mediastinal structures are normal. Post CABG changes are present. The bony thorax is unremarkable.  IMPRESSION: There is no active cardiopulmonary disease.   Electronically Signed   By: Sharry Beining  Martinique   On: 12/25/2013 14:32     EKG Interpretation   Date/Time:  Friday December 25 2013 13:35:33 EDT Ventricular Rate:  112 PR Interval:  124 QRS Duration: 72 QT Interval:  344 QTC Calculation: 469 R Axis:   75 Text Interpretation:  Sinus tachycardia with Fusion complexes Biatrial  enlargement Nonspecific ST and T wave abnormality Abnormal ECG No  significant change since last tracing Confirmed by BEATON  MD, ROBERT  ([id number]) on 12/25/2013 1:52:52 PM      MDM   Final diagnoses:   Muscle contraction headache  Other chest pain  Urinary tract infection without hematuria, site unspecified  Renal insufficiency    Headache which seems to be a muscle contraction headache based on physical exam. She'll be  given a trial of a headache cocktail with IV fluids, IV metoclopramide, and IV diphenhydramine. Chest pain of uncertain cause. I have reviewed her past records and she has been followed by cardiology 4 chronic chest pain. She is status post coronary artery bypass and placement of multiple stents, but most recent catheterization in 2012 showed no significant obstructive disease and she had a Myoview study which did not show areas of ischemia. ECG is unchanged in troponin is normal. However, her blood pressure is significantly lower than it has been on past visits and creatinine and BUN have increased over baseline. Orthostatic vital signs will be checked.  Metastatic vital signs did show a rise in pulse but no change in blood pressure. She's given additional IV fluid. Headache has improved with the above-noted cocktail but started to recur and she had further improvement with a dose of ketorolac. Urinalysis has come back positive for UTI and she's given a dose of ceftriaxone. She's feeling much better after all of this treatment although blood pressure continues to stay in the 73X systolic. Creatinine is noted to have arisen over baseline although only minimally elevated at 1.17. She is discharged with a prescription for cephalexin for UTI, and metoclopramide for headache. She's a vice use over-the-counter ibuprofen as needed. She is to make a followup appointment with her cardiologist regarding ongoing problems with chest pain, and with her PCP to recheck creatinine in one week.  Delora Fuel, MD 10/62/69 [id number]

## 2013-12-27 LAB — URINE CULTURE

## 2013-12-28 ENCOUNTER — Telehealth: Payer: Self-pay | Admitting: Internal Medicine

## 2013-12-28 NOTE — Telephone Encounter (Signed)
Please call to check on pt.  It looks like she has an appointment with Rollene Fare on Thursday but may need to be seen sooner.  Does anyone have any openings?

## 2013-12-28 NOTE — Telephone Encounter (Signed)
Patient Information:  Caller Name: Nashea  Phone: [phone]  Patient: [name], [name]  Gender: Female  DOB: [birth year]  Age: 58 Years  PCP: [name]  Office Follow Up:  Does the office need to follow up with this patient?: Yes  Instructions For The Office: She has multiple issues going on UTI/ intermittent CP last week, seen in the ER and advised to be seen in the office ASAP due to very low BP and  now constant nausea, not sure if cardiac or from antibiotics. Please call and advise if ER needed.  RN Note:  Afebrile. Onset of severe headache, that radiated to the right ear and CP 12/25/2013, ER prescribed Keflex,which she now has severe vaginitis and Reglan, not helping nausea. Today, 12/28/2013 headache is still hurting, Hydrocodone is not helping, she feels weak but denies SOB, CP or dizziness. CAN do ADL's but worst situation today is Nausea.emergent signs and symptoms of Nausea or Vomiting protocol ruled out except 'Three or more episodes of indigestion/reflux per week for two weeks or more ANd symptoms not relieved with home care. Dr. Martinique takes care of her cardiac issues, not available and is scheduled to see, Dr. Wendie Simmer (sp?),ER Dr. did discuss with Dr. Delilah Shan Wednesday, 7.8.2015 has a scheduled appointment. She is unable to take BP and states she is hesitant to take her Nitro today if needed due to very low BP in the ER 80/50 and was told to see her Dr. Donnajean Lopes. Please advise today, 12/28/2013 if ER needed for continued Nausea. (She is on Keflex and OTC vaginitis medication). Per CECC guideline All emergent signs and symptoms of Nausea or Vomiting protocol ruled out except 'Three or more episodes of indigestion/reflux per week for two weeks or more ANd symptoms not relieved with home care.  Symptoms  Reason For Call & Symptoms: Recent ER visit for headache and CP.  Reviewed Health History In EMR: Yes  Reviewed Medications In EMR: Yes  Reviewed Allergies In EMR: Yes  Reviewed Surgeries /  Procedures: Yes  Date of Onset of Symptoms: 12/25/2013  Treatments Tried: Keflex and Reglan  Treatments Tried Worked: No  Guideline(s) Used:  Vomiting  No Protocol Available - Sick Adult  Disposition Per Guideline:   Callback by PCP or Subspecialist within 1 Hour  Reason For Disposition Reached:   Nursing judgment  Advice Given:  Call Back If:  New symptoms develop  You become worse.  Patient Will Follow Care Advice:  YES  [phone]

## 2013-12-28 NOTE — Telephone Encounter (Signed)
[name]'s pt- please call to check on her.

## 2013-12-28 NOTE — Telephone Encounter (Signed)
Spoke to pt and offered her an earlier appt. She states that she is "ok and can wait til Thursday." She states that if her s/s begin to worsen before upcoming appt, she will go to the ED

## 2013-12-28 NOTE — Telephone Encounter (Signed)
Lm on pts vm requesting a call back 

## 2013-12-28 NOTE — Telephone Encounter (Signed)
PCP out of office, CAN disposition states to call pt back within an hour.  Routed to PCP and to Dr. Deborra Medina.

## 2013-12-28 NOTE — Telephone Encounter (Signed)
Noted  

## 2013-12-28 NOTE — Telephone Encounter (Signed)
Pt calling regarding headache and nausea; RN attempted to call pt back for triage but no answer; left message on VM  to call office back if still needs assistance CD/CAN

## 2013-12-29 ENCOUNTER — Telehealth (HOSPITAL_BASED_OUTPATIENT_CLINIC_OR_DEPARTMENT_OTHER): Payer: Self-pay

## 2013-12-29 NOTE — Telephone Encounter (Signed)
I have multiple attempts to contact pt, but unfortunately have been unsuccessful. Pt has 12/31/2013 appt with [facility]

## 2013-12-29 NOTE — Telephone Encounter (Signed)
Lm on pts vm requesting a call back 

## 2013-12-29 NOTE — Telephone Encounter (Signed)
Post ED Visit - Positive Culture Follow-up  Culture report reviewed by antimicrobial stewardship pharmacist: []  Wes Dulaney, Pharm.D., BCPS [x]  Heide Guile, Pharm.D., BCPS []  Alycia Rossetti, Pharm.D., BCPS []  [place], Pharm.D., BCPS, AAHIVP []  Legrand Como, Pharm.D., BCPS, AAHIVP []    Positive urine culture, >/= 100,000 colonies -> E Coli Treated with Cephalexin, organism sensitive to the same and no further patient follow-up is required at this time.  [name] 12/29/2013, 3:57 AM

## 2013-12-30 NOTE — Telephone Encounter (Signed)
Pt has a hospital f.u appt for 12/31/13 with [facility]

## 2013-12-31 ENCOUNTER — Ambulatory Visit (INDEPENDENT_AMBULATORY_CARE_PROVIDER_SITE_OTHER): Payer: Medicare Other | Admitting: Internal Medicine

## 2013-12-31 ENCOUNTER — Encounter: Payer: Self-pay | Admitting: Internal Medicine

## 2013-12-31 VITALS — BP 104/76 | HR 72 | Temp 98.2°F | Wt 156.0 lb

## 2013-12-31 DIAGNOSIS — T3695XA Adverse effect of unspecified systemic antibiotic, initial encounter: Secondary | ICD-10-CM

## 2013-12-31 DIAGNOSIS — R079 Chest pain, unspecified: Secondary | ICD-10-CM

## 2013-12-31 DIAGNOSIS — I2510 Atherosclerotic heart disease of native coronary artery without angina pectoris: Secondary | ICD-10-CM

## 2013-12-31 DIAGNOSIS — R350 Frequency of micturition: Secondary | ICD-10-CM | POA: Diagnosis not present

## 2013-12-31 DIAGNOSIS — G8929 Other chronic pain: Secondary | ICD-10-CM

## 2013-12-31 DIAGNOSIS — B379 Candidiasis, unspecified: Secondary | ICD-10-CM

## 2013-12-31 DIAGNOSIS — R51 Headache: Secondary | ICD-10-CM | POA: Diagnosis not present

## 2013-12-31 DIAGNOSIS — N390 Urinary tract infection, site not specified: Secondary | ICD-10-CM | POA: Diagnosis not present

## 2013-12-31 DIAGNOSIS — R112 Nausea with vomiting, unspecified: Secondary | ICD-10-CM

## 2013-12-31 DIAGNOSIS — K219 Gastro-esophageal reflux disease without esophagitis: Secondary | ICD-10-CM

## 2013-12-31 LAB — COMPREHENSIVE METABOLIC PANEL
ALK PHOS: 54 U/L (ref 39–117)
ALT: 12 U/L (ref 0–35)
AST: 12 U/L (ref 0–37)
Albumin: 4.3 g/dL (ref 3.5–5.2)
BILIRUBIN TOTAL: 0.4 mg/dL (ref 0.2–1.2)
BUN: 11 mg/dL (ref 6–23)
CO2: 28 mEq/L (ref 19–32)
CREATININE: 0.7 mg/dL (ref 0.4–1.2)
Calcium: 10.4 mg/dL (ref 8.4–10.5)
Chloride: 100 mEq/L (ref 96–112)
GFR: 93.51 mL/min (ref >60.00)
Glucose, Bld: 98 mg/dL (ref 70–99)
Potassium: 5 mEq/L (ref 3.5–5.1)
Sodium: 136 mEq/L (ref 135–145)
Total Protein: 7.2 g/dL (ref 6.0–8.3)

## 2013-12-31 LAB — POCT URINALYSIS DIPSTICK
Glucose, UA: NEGATIVE
Ketones, UA: NEGATIVE
Nitrite, UA: NEGATIVE
PROTEIN UA: NEGATIVE
Spec Grav, UA: 1.025
UROBILINOGEN UA: 0.2
pH, UA: 6

## 2013-12-31 MED ORDER — METOCLOPRAMIDE HCL 10 MG PO TABS: 10.0000 mg | ORAL_TABLET | Freq: Four times a day (QID) | ORAL | Status: DC | PRN

## 2013-12-31 MED ORDER — METHYLPREDNISOLONE ACETATE 40 MG/ML IJ SUSP
80.0000 mg | Freq: Once | INTRAMUSCULAR | Status: DC
  Administered 2013-12-31: 80 mg via INTRAMUSCULAR

## 2013-12-31 MED ORDER — PROMETHAZINE HCL 25 MG PO TABS: 25.0000 mg | ORAL_TABLET | Freq: Four times a day (QID) | ORAL | Status: DC | PRN

## 2013-12-31 MED ORDER — CIPROFLOXACIN HCL 500 MG PO TABS: 500.0000 mg | ORAL_TABLET | Freq: Two times a day (BID) | ORAL | Status: DC

## 2013-12-31 MED ORDER — CYCLOBENZAPRINE HCL 10 MG PO TABS: 10.0000 mg | ORAL_TABLET | Freq: Every day | ORAL | Status: DC

## 2013-12-31 MED ORDER — DEXLANSOPRAZOLE 60 MG PO CPDR: 60.0000 mg | DELAYED_RELEASE_CAPSULE | Freq: Every day | ORAL | Status: DC

## 2013-12-31 MED ORDER — KETOROLAC TROMETHAMINE 30 MG/ML IJ SOLN
30.0000 mg | Freq: Once | INTRAMUSCULAR | Status: AC
  Administered 2013-12-31: 30 mg via INTRAMUSCULAR

## 2013-12-31 MED ORDER — FLUCONAZOLE 150 MG PO TABS: 150.0000 mg | ORAL_TABLET | Freq: Once | ORAL | Status: DC

## 2013-12-31 NOTE — Addendum Note (Signed)
Addended by: Jearld Fenton on: 12/31/2013 11:11 AM   Modules accepted: Orders

## 2013-12-31 NOTE — Addendum Note (Signed)
Addended by: Lurlean Nanny on: 12/31/2013 01:28 PM   Modules accepted: Orders

## 2013-12-31 NOTE — Patient Instructions (Addendum)

## 2013-12-31 NOTE — Progress Notes (Signed)
Pre visit review using our clinic review tool, if applicable. No additional management support is needed unless otherwise documented below in the visit note. 

## 2013-12-31 NOTE — Addendum Note (Signed)
Addended by: Lurlean Nanny on: 12/31/2013 01:54 PM   Modules accepted: Orders

## 2013-12-31 NOTE — Progress Notes (Signed)
Subjective:    Patient ID: [name], female    DOB: Nov 09, 1955, 58 y.o.   MRN: [id number]  HPI  Pt presents to the clinic today for ER followup. She presented to the ER 12/25/13 for headache lasting 5 days, nausea and vomiting. She had also been experiencing episodes of intermittent chest pain (a chronic issue for her) which was relieved with her nitroglycerin. The chest pain did not radiate. In the ER, she was mildly hypotensive. Labs showed mild dehydration along with UTI. Chest xray and ECG were normal. She was treated with IV fluid, reglan, benadryl, ketorolac and rocephin. She was feeling much better so she was discharge home with keflex. Since being at home, she has had headaches. The headaches are constant. She is taking her hydrocodone every 4 hours to control the antibiotic. She continues to have dysuria, urgency and frequency. She has 4 days left on the antibiotic. She thinks she may have gotten a yeast infection from the antibiotic.She c/o vaginal discharge and itching. She has not tried anything OTC.  Review of Systems      Past Medical History  Diagnosis Date  . Chest pain, cardiac     Has chronic chest pain.   . Coronary artery disease     a) s/p CABG x 2 in [place] 2002. b)stenting to the SVG-OM in 2009. c)DES to the saphenous vein graft to the ramus in March 2012. d) s/p PTCA/DES to native ramus intermedius in September 2012. Last cath 03/2011 with minimal nonobstructive disease  e) indeterminant myoview 09/2011 but atypical CP & no perfusion defect in ramus intermediate distribution, EF 52%  . Hypertension   . Hyperlipidemia   . Tobacco abuse   . Hypothyroidism   . Hx: UTI (urinary tract infection)     multiple  . Enterocolitis   . Gastroparesis   . Complication of anesthesia     "my blood pressure will drop out on me"  . 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 Anemia   . Blood transfusion   . GERD (gastroesophageal reflux disease)   .  Hematuria 09/25/11    "lately"  . GI bleeding   . Anxiety   . Depression   . PTSD (post-traumatic stress disorder)   . Migraines     h/o    Current Outpatient Prescriptions  Medication Sig Dispense Refill  . ALPRAZolam (XANAX) 1 MG tablet Take 1 tablet (1 mg total) by mouth 3 (three) times daily as needed for sleep.  90 tablet  0  . amLODipine (NORVASC) 5 MG tablet Take 1 tablet (5 mg total) by mouth every evening.  90 tablet  3  . cephALEXin (KEFLEX) 500 MG capsule Take 1 capsule (500 mg total) by mouth 4 (four) times daily.  40 capsule  0  . clopidogrel (PLAVIX) 75 MG tablet Take 1 tablet (75 mg total) by mouth every evening.  90 tablet  1  . dexlansoprazole (DEXILANT) 60 MG capsule Take 60 mg by mouth daily.      . diphenhydramine-acetaminophen (TYLENOL PM) 25-500 MG TABS Take 1 tablet by mouth at bedtime as needed (pain, sleep).      Marland Kitchen HYDROcodone-acetaminophen (NORCO) 10-325 MG per tablet Take 1 tablet by mouth every 4 (four) hours as needed.  180 tablet  0  . levothyroxine (SYNTHROID, LEVOTHROID) 25 MCG tablet Take 1 tablet (25 mcg total) by mouth every evening.  90 tablet  3  . metoCLOPramide (REGLAN) 10  MG tablet Take 1 tablet (10 mg total) by mouth every 6 (six) hours as needed for nausea (or headache).  30 tablet  0  . metoprolol succinate (TOPROL-XL) 50 MG 24 hr tablet Take 1 tablet (50 mg total) by mouth daily. Take with or immediately following a meal.  90 tablet  3  . nitroGLYCERIN (NITROSTAT) 0.4 MG SL tablet Place 1 tablet (0.4 mg total) under the tongue every 5 (five) minutes as needed (up to 3 doses). For chest pain  25 tablet  4  . promethazine (PHENERGAN) 25 MG tablet Take 1 tablet (25 mg total) by mouth every 6 (six) hours as needed. For nausea  30 tablet  0  . rosuvastatin (CRESTOR) 20 MG tablet Take 1 tablet (20 mg total) by mouth every evening.  90 tablet  3   No current facility-administered medications for this visit.   Facility-Administered Medications Ordered in  Other Visits  Medication Dose Route Frequency Provider Last Rate Last Dose  . bupivacaine (MARCAINE) 0.5 % 15 mL, phenazopyridine (PYRIDIUM) 400 mg bladder mixture   Bladder Instillation Once Reece Packer, MD        Allergies  Allergen Reactions  . Morphine And Related Other (See Comments)    Flushing & "makes me feel weird; that sets off a panic attack" Pain meds lowers blood pressure  . Ranitidine Hives  . Sulfa Antibiotics Other (See Comments)    Childhood reaction    Family History  Problem Relation Age of Onset  . Heart disease Father   . Emphysema Mother     smoked  . Asthma Mother   . Heart disease Paternal Grandfather   . Breast cancer Mother     History   Social History  . Marital Status: Widowed    Spouse Name: N/A    Number of Children: 2  . Years of Education: N/A   Occupational History  . Disabled    Social History Main Topics  . Smoking status: Current Every Day Smoker -- 0.25 packs/day for 15 years    Types: Cigarettes  . Smokeless tobacco: Never Used  . Alcohol Use: No  . Drug Use: Yes    Special: Marijuana     Comment: "as a teen"  . Sexual Activity: Not Currently   Other Topics Concern  . Not on file   Social History Narrative  . No narrative on file     Constitutional: Pt reports fatigue and headache. Denies fever, malaise or abrupt weight changes.  Respiratory: Denies difficulty breathing, shortness of breath, cough or sputum production.   Cardiovascular: Pt reports intermittent chest pain. Denies chest tightness, palpitations or swelling in the hands or feet.  Gastrointestinal: Denies abdominal pain, bloating, constipation, diarrhea or blood in the stool.  GU: Pt reports urgency, frequency, dysuria and vaginal discharge. Denies blood in urine, odor. Musculoskeletal: Pt reports right side neck stiffness. Denies decrease in range of motion, difficulty with gait,or joint pain and swelling.   No other specific complaints in a  complete review of systems (except as listed in HPI above).  Objective:   Physical Exam   BP 104/76  Pulse 72  Temp(Src) 98.2 F (36.8 C) (Oral)  Wt 156 lb (70.761 kg)  SpO2 98% Wt Readings from Last 3 Encounters:  12/31/13 156 lb (70.761 kg)  09/22/13 150 lb 6.4 oz (68.221 kg)  08/18/13 154 lb 12 oz (70.194 kg)    General: Appears her stated age, well developed, well nourished in NAD. Cardiovascular: Normal  rate and rhythm. S1,S2 noted.  No murmur, rubs or gallops noted. No JVD or BLE edema. No carotid bruits noted. Pulmonary/Chest: Normal effort and positive vesicular breath sounds. No respiratory distress. No wheezes, rales or ronchi noted.  Abdomen: Soft and slightly tender over the bladder. Normal bowel sounds, no bruits noted. No distention or masses noted. Liver, spleen and kidneys non palpable. No CVA tenderness. Pelvic: deffered Musculoskeletal: Normal range of motion of the neck. Pain with palpation of the paracervical muscles on the right side.  BMET    Component Value Date/Time   NA 139 12/25/2013 1519   K 3.5* 12/25/2013 1519   CL 98 12/25/2013 1519   CO2 24 12/25/2013 1519   GLUCOSE 112* 12/25/2013 1519   BUN 22 12/25/2013 1519   CREATININE 1.17* 12/25/2013 1519   CALCIUM 9.5 12/25/2013 1519   GFRNONAA 51* 12/25/2013 1519   GFRAA 59* 12/25/2013 1519    Lipid Panel     Component Value Date/Time   CHOL 152 06/19/2012 1104   TRIG 185.0* 06/19/2012 1104   HDL 46.40 06/19/2012 1104   CHOLHDL 3 06/19/2012 1104   VLDL 37.0 06/19/2012 1104   LDLCALC 69 06/19/2012 1104    CBC    Component Value Date/Time   WBC 13.4* 12/25/2013 1519   RBC 4.80 12/25/2013 1519   HGB 15.5* 12/25/2013 1519   HCT 44.7 12/25/2013 1519   PLT 237 12/25/2013 1519   MCV 93.1 12/25/2013 1519   MCH 32.3 12/25/2013 1519   MCHC 34.7 12/25/2013 1519   RDW 13.2 12/25/2013 1519   LYMPHSABS 2.8 12/25/2013 1519   MONOABS 0.5 12/25/2013 1519   EOSABS 0.1 12/25/2013 1519   BASOSABS 0.0 12/25/2013 1519    Hgb A1C Lab Results    Component Value Date   HGBA1C 5.7* 03/08/2011        Assessment & Plan:  Hospital follow up for headache, UTI, and chest pain:  Hospital records, labs, imaging reviewed. Time to review approx 20 minutes. Urinalysis: trace leuks, mod blood, small bili Will start Cipro 500 mg BID x 5 days (culture grew out ecoli) Will give diflucan for antibiotic induced yeast infection Continue phenergan/reglan for nausea and vomiting Consume small bland meals and drink plenty of water to avoid dehydration Will refill your dexilant until you can get it with GI Also continue nitro for chronic chest pain- make a follow up with cardiology ASAP (no red flags today) 80 mg depo/30 mg toradol given for headache (she has a friend to drive her home) Will try flexeril at night to help with tension headache to cut back on the norco use  RTC in 1 week if symptoms persist, sooner if they worsen  Will repeat CMET today per hospital request

## 2014-01-04 ENCOUNTER — Encounter: Payer: Self-pay | Admitting: Cardiology

## 2014-01-04 ENCOUNTER — Ambulatory Visit (INDEPENDENT_AMBULATORY_CARE_PROVIDER_SITE_OTHER): Payer: Medicare Other | Admitting: Cardiology

## 2014-01-04 VITALS — BP 114/82 | HR 94 | Ht 60.0 in | Wt 153.1 lb

## 2014-01-04 DIAGNOSIS — E785 Hyperlipidemia, unspecified: Secondary | ICD-10-CM

## 2014-01-04 DIAGNOSIS — F419 Anxiety disorder, unspecified: Secondary | ICD-10-CM

## 2014-01-04 DIAGNOSIS — R51 Headache: Secondary | ICD-10-CM

## 2014-01-04 DIAGNOSIS — I10 Essential (primary) hypertension: Secondary | ICD-10-CM

## 2014-01-04 DIAGNOSIS — I2510 Atherosclerotic heart disease of native coronary artery without angina pectoris: Secondary | ICD-10-CM

## 2014-01-04 DIAGNOSIS — R079 Chest pain, unspecified: Secondary | ICD-10-CM

## 2014-01-04 DIAGNOSIS — R0789 Other chest pain: Secondary | ICD-10-CM

## 2014-01-04 DIAGNOSIS — F411 Generalized anxiety disorder: Secondary | ICD-10-CM | POA: Diagnosis not present

## 2014-01-04 DIAGNOSIS — R519 Headache, unspecified: Secondary | ICD-10-CM | POA: Insufficient documentation

## 2014-01-04 NOTE — Assessment & Plan Note (Signed)
Pt does have a history of anxiety. She was in the ER in March after a "seizure"  Which was felt to be secondary to anxiety and stress. She was seen by [facility] then.

## 2014-01-04 NOTE — Assessment & Plan Note (Signed)
On statin.

## 2014-01-04 NOTE — Patient Instructions (Signed)
Kerin Ransom, PA-C, has requested that you have a lexiscan myoview. For further information please visit [email or web address]. Please follow instruction sheet, as given.  Your physician recommends that you schedule a follow-up appointment in 1-2 weeks with [facility].

## 2014-01-04 NOTE — Progress Notes (Signed)
01/04/2014 [name]   04-Mar-1956  [id number]  Primary Physicia [name], NP Primary Cardiologist: Dr Martinique  HPI:  Pt is a 58 y/o female seen in the office today for chest pain. She had remote coronary bypass surgery in 2002. She had prior stenting of the vein graft to the obtuse marginal vessel in 2009. She then had a drug-eluting stent placement to this same saphenous vein graft to the ramus in March of 2012. This subsequently occluded and she underwent angioplasty and drug-eluting stent to the ramus intermediate branch in September of 2012. Cardiac catheterization again in October of 2012 showed nonobstructive disease. She had a Lexiscan in April 2013 bu this was a poor study. She has a history of anxiety. She was admitted to the ER in March 2015 after a "seizure which apparently was felt to secondary to anxiety and stress. She was not evaluated by neurology, CT was negative. She was seen by [facility].               She has been having problems recently with headaches which she has been told are "tension headaches". When she gets a bad headache she also has been getting some chest pain. She describes a mid sternal sharp pain, relieved with NTG. She is in the office today for evaluation.     Current Outpatient Prescriptions  Medication Sig Dispense Refill  . ALPRAZolam (XANAX) 1 MG tablet Take 1 tablet (1 mg total) by mouth 3 (three) times daily as needed for sleep.  90 tablet  0  . amLODipine (NORVASC) 5 MG tablet Take 1 tablet (5 mg total) by mouth every evening.  90 tablet  3  . cephALEXin (KEFLEX) 500 MG capsule Take 1 capsule by mouth 2 (two) times daily.      . ciprofloxacin (CIPRO) 500 MG tablet Take 1 tablet (500 mg total) by mouth 2 (two) times daily.  10 tablet  0  . clopidogrel (PLAVIX) 75 MG tablet Take 1 tablet (75 mg total) by mouth every evening.  90 tablet  1  . cyclobenzaprine (FLEXERIL) 10 MG tablet Take 1 tablet (10 mg total) by mouth at bedtime.  30 tablet   0  . dexlansoprazole (DEXILANT) 60 MG capsule Take 1 capsule (60 mg total) by mouth daily.  30 capsule  2  . diphenhydramine-acetaminophen (TYLENOL PM) 25-500 MG TABS Take 1 tablet by mouth at bedtime as needed (pain, sleep).      Marland Kitchen HYDROcodone-acetaminophen (NORCO) 10-325 MG per tablet Take 1 tablet by mouth every 4 (four) hours as needed.  180 tablet  0  . levothyroxine (SYNTHROID, LEVOTHROID) 25 MCG tablet Take 1 tablet (25 mcg total) by mouth every evening.  90 tablet  3  . metoprolol succinate (TOPROL-XL) 50 MG 24 hr tablet Take 1 tablet (50 mg total) by mouth daily. Take with or immediately following a meal.  90 tablet  3  . nitroGLYCERIN (NITROSTAT) 0.4 MG SL tablet Place 1 tablet (0.4 mg total) under the tongue every 5 (five) minutes as needed (up to 3 doses). For chest pain  25 tablet  4  . promethazine (PHENERGAN) 25 MG tablet Take 1 tablet (25 mg total) by mouth every 6 (six) hours as needed. For nausea  30 tablet  0  . rosuvastatin (CRESTOR) 20 MG tablet Take 1 tablet (20 mg total) by mouth every evening.  90 tablet  3   No current facility-administered medications for this visit.   Facility-Administered Medications Ordered in  Other Visits  Medication Dose Route Frequency Provider Last Rate Last Dose  . bupivacaine (MARCAINE) 0.5 % 15 mL, phenazopyridine (PYRIDIUM) 400 mg bladder mixture   Bladder Instillation Once Reece Packer, MD        Allergies  Allergen Reactions  . Ranitidine Hives  . Sulfa Antibiotics Other (See Comments)    Childhood reaction    History   Social History  . Marital Status: Widowed    Spouse Name: N/A    Number of Children: 2  . Years of Education: N/A   Occupational History  . Disabled    Social History Flynn Topics  . Smoking status: Former Smoker -- 0.25 packs/day for 15 years    Types: Cigarettes  . Smokeless tobacco: Never Used  . Alcohol Use: No  . Drug Use: Yes    Special: Marijuana     Comment: "as a teen"  . Sexual Activity:  Not Currently   Other Topics Concern  . Not on file   Social History Narrative  . No narrative on file     Review of Systems: General: negative for chills, fever, night sweats or weight changes.  Cardiovascular: negative for chest pain, dyspnea on exertion, edema, orthopnea, palpitations, paroxysmal nocturnal dyspnea or shortness of breath Dermatological: negative for rash Respiratory: negative for cough or wheezing Urologic: negative for hematuria Abdominal: negative for nausea, vomiting, diarrhea, bright red blood per rectum, melena, or hematemesis Neurologic: negative for visual changes, syncope, or dizziness All other systems reviewed and are otherwise negative except as noted above.    Blood pressure 114/82, pulse 94, height 5' (1.524 m), weight 153 lb 1.6 oz (69.446 kg).  General appearance: alert, cooperative and no distress Neck: no carotid bruit and no JVD Lungs: clear to auscultation bilaterally Heart: regular rate and rhythm  EKG NSR without acute changes  ASSESSMENT AND PLAN:   Chest pain with moderate risk of acute coronary syndrome She has had some chest pain and has taken NTG prn which she says helps  Anxiety Pt does have a history of anxiety. She was in the ER in March after a "seizure"  Which was felt to be secondary to anxiety and stress. She was seen by [facility] then.   Coronary artery disease CABG '02, SVG-OM PCI '09, SVG-RI PCI 3/12, RI DES for ISR 9/12, cath 10/12 OK.   Hyperlipidemia On statin  Hypertension Controlled  Headache Sounds like tension headache   PLAN  Im hesitant to use Imdur with her headaches. She will continue to use NTG prn, she does not have pain daily. In the past she has had problems with hypotension so I did not add Norvasc. I have scheduled her for a Lexiscan.   [name] KPA-C 01/04/2014 9:03 AM

## 2014-01-04 NOTE — Assessment & Plan Note (Signed)
Sounds like tension headache

## 2014-01-04 NOTE — Assessment & Plan Note (Signed)
Controlled.  

## 2014-01-04 NOTE — Assessment & Plan Note (Signed)
CABG '02, SVG-OM PCI '09, SVG-RI PCI 3/12, RI DES for ISR 9/12, cath 10/12 OK.

## 2014-01-04 NOTE — Assessment & Plan Note (Addendum)
She has had some chest pain and has taken NTG prn which she says helps

## 2014-01-07 ENCOUNTER — Telehealth (HOSPITAL_COMMUNITY): Payer: Self-pay

## 2014-01-07 NOTE — Telephone Encounter (Signed)
Encounter complete. 

## 2014-01-12 ENCOUNTER — Ambulatory Visit (HOSPITAL_COMMUNITY)
Admission: RE | Admit: 2014-01-12 | Discharge: 2014-01-12 | Disposition: A | Discharge status: Home / Self Care | Payer: Medicare Other | Source: Ambulatory Visit | Attending: Cardiovascular Disease | Admitting: Cardiovascular Disease

## 2014-01-12 DIAGNOSIS — R0789 Other chest pain: Secondary | ICD-10-CM | POA: Diagnosis not present

## 2014-01-12 MED ORDER — TECHNETIUM TC 99M SESTAMIBI GENERIC - CARDIOLITE
30.0000 | Freq: Once | INTRAVENOUS | Status: AC | PRN
Start: 2014-01-12 — End: 2014-01-12
  Administered 2014-01-12: 30 via INTRAVENOUS

## 2014-01-12 MED ORDER — AMINOPHYLLINE 25 MG/ML IV SOLN
125.0000 mg | Freq: Once | INTRAVENOUS | Status: AC
  Administered 2014-01-12: 125 mg via INTRAVENOUS

## 2014-01-12 MED ORDER — REGADENOSON 0.4 MG/5ML IV SOLN
0.4000 mg | Freq: Once | INTRAVENOUS | Status: AC
  Administered 2014-01-12: 0.4 mg via INTRAVENOUS

## 2014-01-12 MED ORDER — TECHNETIUM TC 99M SESTAMIBI GENERIC - CARDIOLITE
10.0000 | Freq: Once | INTRAVENOUS | Status: AC | PRN
  Administered 2014-01-12: 10 via INTRAVENOUS

## 2014-01-12 NOTE — Procedures (Addendum)
[place] [address] [place] [address] [phone]  Cardiology Nuclear Med Study  [name] is a 58 y.o. female     MRN : [id number]     DOB: [birth year]  Procedure Date: 01/12/2014  Nuclear Med Background Indication for Stress Test:  Graft Patency, Stent Patency and Post Hospital History:  CAD;MI-2002;CABG X2-2002;STENT/PTCA-2009 AND 2012-X2;SEIZURES;Last NUC MPI in April-2013-no further info available. Cardiac Risk Factors: Family History - CAD, Hypertension, Lipids, Overweight and Smoker  Symptoms:  Chest Pain, Dizziness, DOE, Fatigue and Palpitations   Nuclear Pre-Procedure Caffeine/Decaff Intake:  7:00pm NPO After: 5:00am   IV Site: L Wrist  IV 0.9% NS with Angio Cath:  22g  Chest Size (in):  n/a IV Started by: Rolene Course, RN  Height: 5' (1.524 m)  Cup Size: D  BMI:  Body mass index is 29.88 kg/(m^2). Weight:  153 lb (69.4 kg)   Tech Comments:  n/a    Nuclear Med Study 1 or 2 day study: 1 day  Stress Test Type:  [place] Provider:  Peter Martinique, MD   Resting Radionuclide: Technetium 66m Sestamibi  Resting Radionuclide Dose: 10.2 mCi   Stress Radionuclide:  Technetium 39m Sestamibi  Stress Radionuclide Dose: 30.9 mCi           Stress Protocol Rest HR: 66 Stress HR: 111  Rest BP: 115/78 Stress BP: 150/89  Exercise Time (min): n/a METS: n/a          Dose of Adenosine (mg):  n/a Dose of Lexiscan: 0.4 mg  Dose of Atropine (mg): n/a Dose of Dobutamine: n/a mcg/kg/min (at max HR)  Stress Test Technologist: Mellody Memos, CCT Nuclear Technologist: Otho Perl, CNMT   Rest Procedure:  Myocardial perfusion imaging was performed at rest 45 minutes following the intravenous administration of Technetium 87m Sestamibi. Stress Procedure:  The patient received IV Lexiscan 0.4 mg over 15-seconds.  Technetium 50m Sestamibi injected IV at 30-seconds.  Patient experienced shortness of  breath, stomach pain, head pain and was administered 125 mg Aminophylline IV at 5 min.  There were no significant changes with Lexiscan.  Quantitative spect images were obtained after a 45 minute delay.  Transient Ischemic Dilatation (Normal <1.22):  1.07 QGS EDV:  68 ml QGS ESV:  26 ml LV Ejection Fraction: 62%  Rest ECG: NSR - Normal EKG  Stress ECG: No significant change from baseline ECG  QPS Raw Data Images:  Normal; no motion artifact; normal heart/lung ratio. Stress Images:  distal anterior fixed defect Rest Images:  distal anterior fixed defect Subtraction (SDS):  There is a fixed anteriour defect that is most consistent with breast attenuation.  Impression Exercise Capacity:  Lexiscan with no exercise. BP Response:  Normal blood pressure response. Clinical Symptoms:  No significant symptoms noted. ECG Impression:  No significant ECG changes with Lexiscan. Comparison with Prior Nuclear Study: No significant change from previous study  Overall Impression:  Low risk stress nuclear study with small area of distal anteroapical breast attenuation artifact.  LV Wall Motion:  NL LV Function; NL Wall Motion; EF 62%  [name], MD, [facility] Board Certified in Nuclear Cardiology Attending Cardiologist [place], MD  01/12/2014 12:48 PM

## 2014-01-14 ENCOUNTER — Other Ambulatory Visit: Payer: Self-pay

## 2014-01-14 DIAGNOSIS — F419 Anxiety disorder, unspecified: Secondary | ICD-10-CM

## 2014-01-14 MED ORDER — ALPRAZOLAM 1 MG PO TABS: 1.0000 mg | ORAL_TABLET | Freq: Three times a day (TID) | ORAL | Status: DC | PRN

## 2014-01-14 MED ORDER — HYDROCODONE-ACETAMINOPHEN 10-325 MG PO TABS: 1.0000 | ORAL_TABLET | ORAL | Status: DC | PRN

## 2014-01-14 NOTE — Telephone Encounter (Signed)
Pt left v/m requesting rx hydrocodone apap and alprazolam; pt request to pick up on 01/15/14 so can have filled on 01/17/14. Call when ready for pick up.

## 2014-01-15 NOTE — Telephone Encounter (Signed)
Rx left in front office for pick up and pt is aware  

## 2014-01-25 ENCOUNTER — Telehealth: Payer: Self-pay

## 2014-01-25 NOTE — Telephone Encounter (Signed)
Pt left v/m; pt has frequency of urine, no pain upon urination but pt wants to make sure not starting another UTI. Left v/m for pt to cb .

## 2014-01-26 ENCOUNTER — Ambulatory Visit (INDEPENDENT_AMBULATORY_CARE_PROVIDER_SITE_OTHER): Payer: Medicare Other | Admitting: Internal Medicine

## 2014-01-26 ENCOUNTER — Encounter: Payer: Self-pay | Admitting: Internal Medicine

## 2014-01-26 VITALS — BP 120/80 | HR 92 | Temp 98.3°F | Wt 152.0 lb

## 2014-01-26 DIAGNOSIS — R300 Dysuria: Secondary | ICD-10-CM | POA: Diagnosis not present

## 2014-01-26 DIAGNOSIS — I2510 Atherosclerotic heart disease of native coronary artery without angina pectoris: Secondary | ICD-10-CM | POA: Diagnosis not present

## 2014-01-26 DIAGNOSIS — N3090 Cystitis, unspecified without hematuria: Secondary | ICD-10-CM | POA: Diagnosis not present

## 2014-01-26 LAB — POCT URINALYSIS DIPSTICK
Bilirubin, UA: NEGATIVE
Glucose, UA: NEGATIVE
KETONES UA: NEGATIVE
Leukocytes, UA: NEGATIVE
Nitrite, UA: NEGATIVE
PROTEIN UA: NEGATIVE
Spec Grav, UA: >=1.03
UROBILINOGEN UA: NEGATIVE
pH, UA: 6

## 2014-01-26 NOTE — Telephone Encounter (Signed)
Pt having frequency of urine and concerned about possible UTI; pt also wants to discuss last lab re: to kidney function. Pt scheduled appt today at 4:15 with [name], NP.

## 2014-01-26 NOTE — Progress Notes (Signed)
HPI  Pt presents to the clinic today with c/o frequency. This started a few days ago. She denies dysuria, blood in her urine. She denies fever, chills or low back pain. She is concerned becauase 1 month ago she was in the ER for a UTI. She want her urine rechecked today.   Review of Systems  Past Medical History  Diagnosis Date  . Chest pain, cardiac     Has chronic chest pain.   . Coronary artery disease     a) s/p CABG x 2 in [place] 2002. b)stenting to the SVG-OM in 2009. c)DES to the saphenous vein graft to the ramus in March 2012. d) s/p PTCA/DES to native ramus intermedius in September 2012. Last cath 03/2011 with minimal nonobstructive disease  e) indeterminant myoview 09/2011 but atypical CP & no perfusion defect in ramus intermediate distribution, EF 52%  . Hypertension   . Hyperlipidemia   . Tobacco abuse   . Hypothyroidism   . Hx: UTI (urinary tract infection)     multiple  . Enterocolitis   . Gastroparesis   . Complication of anesthesia     "my blood pressure will drop out on me"  . 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 Anemia   . Blood transfusion   . GERD (gastroesophageal reflux disease)   . Hematuria 09/25/11    "lately"  . GI bleeding   . Anxiety   . Depression   . PTSD (post-traumatic stress disorder)   . Migraines     h/o    Family History  Problem Relation Age of Onset  . Heart disease Father   . Emphysema Mother     smoked  . Asthma Mother   . Heart disease Paternal Grandfather   . Breast cancer Mother     History   Social History  . Marital Status: Widowed    Spouse Name: N/A    Number of Children: 2  . Years of Education: N/A   Occupational History  . Disabled    Social History Main Topics  . Smoking status: Former Smoker -- 0.25 packs/day for 15 years    Types: Cigarettes  . Smokeless tobacco: Never Used  . Alcohol Use: No  . Drug Use: Yes    Special: Marijuana     Comment: "as a teen"  . Sexual Activity:  Not Currently   Other Topics Concern  . Not on file   Social History Narrative  . No narrative on file    Allergies  Allergen Reactions  . Ranitidine Hives  . Sulfa Antibiotics Other (See Comments)    Childhood reaction    Constitutional: Denies fever, malaise, fatigue, headache or abrupt weight changes.   GU: Pt reports urgency, frequency. Denies burning sensation, blood in urine, odor or discharge. Skin: Denies redness, rashes, lesions or ulcercations.   No other specific complaints in a complete review of systems (except as listed in HPI above).    Objective:   Physical Exam  BP 120/80  Pulse 92  Temp(Src) 98.3 F (36.8 C) (Tympanic)  Wt 152 lb (68.947 kg)  SpO2 98% Wt Readings from Last 3 Encounters:  01/26/14 152 lb (68.947 kg)  01/12/14 153 lb (69.4 kg)  01/04/14 153 lb 1.6 oz (69.446 kg)    General: Appears her stated age, well developed, well nourished in NAD. Cardiovascular: Normal rate and rhythm. S1,S2 noted.  No murmur, rubs or gallops noted. No JVD or BLE edema.  No carotid bruits noted. Pulmonary/Chest: Normal effort and positive vesicular breath sounds. No respiratory distress. No wheezes, rales or ronchi noted.  Abdomen: Soft and nontender. Normal bowel sounds, no bruits noted. No distention or masses noted. Liver, spleen and kidneys non palpable. No CVA tenderness.      Assessment & Plan:   Urgency, Frequency likely cystits   Urinalysis: small blood Avoid caffeine, smoking and other things that will irritate the bladder Drink plenty of fluids  RTC as needed or if symptoms persist.

## 2014-01-26 NOTE — Patient Instructions (Addendum)
Urinary Tract Infection °Urinary tract infections (UTIs) can develop anywhere along your urinary tract. Your urinary tract is your body's drainage system for removing wastes and extra water. Your urinary tract includes two kidneys, two ureters, a bladder, and a urethra. Your kidneys are a pair of bean-shaped organs. Each kidney is about the size of your fist. They are located below your ribs, one on each side of your spine. °CAUSES °Infections are caused by microbes, which are microscopic organisms, including fungi, viruses, and bacteria. These organisms are so small that they can only be seen through a microscope. Bacteria are the microbes that most commonly cause UTIs. °SYMPTOMS  °Symptoms of UTIs may vary by age and gender of the patient and by the location of the infection. Symptoms in young women typically include a frequent and intense urge to urinate and a painful, burning feeling in the bladder or urethra during urination. Older women and men are more likely to be tired, shaky, and weak and have muscle aches and abdominal pain. A fever may mean the infection is in your kidneys. Other symptoms of a kidney infection include pain in your back or sides below the ribs, nausea, and vomiting. °DIAGNOSIS °To diagnose a UTI, your caregiver will ask you about your symptoms. Your caregiver also will ask to provide a urine sample. The urine sample will be tested for bacteria and white blood cells. White blood cells are made by your body to help fight infection. °TREATMENT  °Typically, UTIs can be treated with medication. Because most UTIs are caused by a bacterial infection, they usually can be treated with the use of antibiotics. The choice of antibiotic and length of treatment depend on your symptoms and the type of bacteria causing your infection. °HOME CARE INSTRUCTIONS °· If you were prescribed antibiotics, take them exactly as your caregiver instructs you. Finish the medication even if you feel better after you  have only taken some of the medication. °· Drink enough water and fluids to keep your urine clear or pale yellow. °· Avoid caffeine, tea, and carbonated beverages. They tend to irritate your bladder. °· Empty your bladder often. Avoid holding urine for long periods of time. °· Empty your bladder before and after sexual intercourse. °· After a bowel movement, women should cleanse from front to back. Use each tissue only once. °SEEK MEDICAL CARE IF:  °· You have back pain. °· You develop a fever. °· Your symptoms do not begin to resolve within 3 days. °SEEK IMMEDIATE MEDICAL CARE IF:  °· You have severe back pain or lower abdominal pain. °· You develop chills. °· You have nausea or vomiting. °· You have continued burning or discomfort with urination. °MAKE SURE YOU:  °· Understand these instructions. °· Will watch your condition. °· Will get help right away if you are not doing well or get worse. °Document Released: 03/21/2005 Document Revised: 12/11/2011 Document Reviewed: 07/20/2011 °ExitCare® Patient Information ©2015 [employer]. This information is not intended to replace advice given to you by your health care provider. Make sure you discuss any questions you have with your health care provider. °Urinary Tract Infection °Urinary tract infections (UTIs) can develop anywhere along your urinary tract. Your urinary tract is your body's drainage system for removing wastes and extra water. Your urinary tract includes two kidneys, two ureters, a bladder, and a urethra. Your kidneys are a pair of bean-shaped organs. Each kidney is about the size of your fist. They are located below your ribs, one on each   side of your spine. °CAUSES °Infections are caused by microbes, which are microscopic organisms, including fungi, viruses, and bacteria. These organisms are so small that they can only be seen through a microscope. Bacteria are the microbes that most commonly cause UTIs. °SYMPTOMS  °Symptoms of UTIs may vary by age  and gender of the patient and by the location of the infection. Symptoms in young women typically include a frequent and intense urge to urinate and a painful, burning feeling in the bladder or urethra during urination. Older women and men are more likely to be tired, shaky, and weak and have muscle aches and abdominal pain. A fever may mean the infection is in your kidneys. Other symptoms of a kidney infection include pain in your back or sides below the ribs, nausea, and vomiting. °DIAGNOSIS °To diagnose a UTI, your caregiver will ask you about your symptoms. Your caregiver also will ask to provide a urine sample. The urine sample will be tested for bacteria and white blood cells. White blood cells are made by your body to help fight infection. °TREATMENT  °Typically, UTIs can be treated with medication. Because most UTIs are caused by a bacterial infection, they usually can be treated with the use of antibiotics. The choice of antibiotic and length of treatment depend on your symptoms and the type of bacteria causing your infection. °HOME CARE INSTRUCTIONS °· If you were prescribed antibiotics, take them exactly as your caregiver instructs you. Finish the medication even if you feel better after you have only taken some of the medication. °· Drink enough water and fluids to keep your urine clear or pale yellow. °· Avoid caffeine, tea, and carbonated beverages. They tend to irritate your bladder. °· Empty your bladder often. Avoid holding urine for long periods of time. °· Empty your bladder before and after sexual intercourse. °· After a bowel movement, women should cleanse from front to back. Use each tissue only once. °SEEK MEDICAL CARE IF:  °· You have back pain. °· You develop a fever. °· Your symptoms do not begin to resolve within 3 days. °SEEK IMMEDIATE MEDICAL CARE IF:  °· You have severe back pain or lower abdominal pain. °· You develop chills. °· You have nausea or vomiting. °· You have continued  burning or discomfort with urination. °MAKE SURE YOU:  °· Understand these instructions. °· Will watch your condition. °· Will get help right away if you are not doing well or get worse. °Document Released: 03/21/2005 Document Revised: 12/11/2011 Document Reviewed: 07/20/2011 °ExitCare® Patient Information ©2015 [employer]. This information is not intended to replace advice given to you by your health care provider. Make sure you discuss any questions you have with your health care provider. ° °

## 2014-01-26 NOTE — Progress Notes (Signed)
Pre visit review using our clinic review tool, if applicable. No additional management support is needed unless otherwise documented below in the visit note. 

## 2014-01-28 ENCOUNTER — Ambulatory Visit: Payer: Medicare Other | Admitting: Cardiology

## 2014-02-15 ENCOUNTER — Other Ambulatory Visit: Payer: Self-pay

## 2014-02-15 DIAGNOSIS — R112 Nausea with vomiting, unspecified: Secondary | ICD-10-CM

## 2014-02-15 DIAGNOSIS — K219 Gastro-esophageal reflux disease without esophagitis: Secondary | ICD-10-CM

## 2014-02-15 DIAGNOSIS — F419 Anxiety disorder, unspecified: Secondary | ICD-10-CM

## 2014-02-15 MED ORDER — ALPRAZOLAM 1 MG PO TABS: 1.0000 mg | ORAL_TABLET | Freq: Three times a day (TID) | ORAL | Status: DC | PRN

## 2014-02-15 MED ORDER — HYDROCODONE-ACETAMINOPHEN 10-325 MG PO TABS: 1.0000 | ORAL_TABLET | ORAL | Status: DC | PRN

## 2014-02-15 NOTE — Telephone Encounter (Signed)
Pt left v/m requesting rx for alprazolam and hydrocodone apap. Pt request to pick up this afternoon. Call when ready for pick up.

## 2014-02-16 MED ORDER — DEXLANSOPRAZOLE 60 MG PO CPDR: 60.0000 mg | DELAYED_RELEASE_CAPSULE | Freq: Every day | ORAL | Status: DC

## 2014-02-16 NOTE — Telephone Encounter (Signed)
Rx left in front office for pick up and pt is aware Pt also requested Dexilant and has been sent to pharmacy

## 2014-02-16 NOTE — Addendum Note (Signed)
Addended by: Lurlean Nanny on: 02/16/2014 08:49 AM   Modules accepted: Orders

## 2014-03-17 ENCOUNTER — Other Ambulatory Visit: Payer: Self-pay

## 2014-03-17 DIAGNOSIS — F419 Anxiety disorder, unspecified: Secondary | ICD-10-CM

## 2014-03-17 NOTE — Telephone Encounter (Signed)
Pt left v/m requesting rx hydrocodone apap and xanax. Call when ready for pick up. Pt request note on rx changed to: can fill on or after 03/19/14 instead of 03/20/14 on both prescriptions. This causes pt to run short on med.Please advise.

## 2014-03-18 ENCOUNTER — Telehealth: Payer: Self-pay | Admitting: Cardiology

## 2014-03-18 MED ORDER — ALPRAZOLAM 1 MG PO TABS: 1.0000 mg | ORAL_TABLET | Freq: Three times a day (TID) | ORAL | Status: DC | PRN

## 2014-03-18 MED ORDER — HYDROCODONE-ACETAMINOPHEN 10-325 MG PO TABS: 1.0000 | ORAL_TABLET | ORAL | Status: DC | PRN

## 2014-03-18 NOTE — Telephone Encounter (Signed)
Spoke with pt, aware we do not have the flu vaccine yet and we do not give the pneumonia shot.

## 2014-03-18 NOTE — Telephone Encounter (Signed)
Rx left in front office for pick up and pt is aware  

## 2014-03-18 NOTE — Telephone Encounter (Signed)
Pt has an appt tomorrow,can she get her flu shot and pneumonia shot?

## 2014-03-18 NOTE — Telephone Encounter (Signed)
Her rx has been given every month on the 26. I am not going to change the date.

## 2014-03-19 ENCOUNTER — Ambulatory Visit (INDEPENDENT_AMBULATORY_CARE_PROVIDER_SITE_OTHER): Payer: Medicare Other | Admitting: Cardiology

## 2014-03-19 ENCOUNTER — Encounter: Payer: Self-pay | Admitting: Internal Medicine

## 2014-03-19 ENCOUNTER — Encounter: Payer: Self-pay | Admitting: Cardiology

## 2014-03-19 VITALS — BP 130/90 | HR 78 | Ht 60.0 in | Wt 154.0 lb

## 2014-03-19 DIAGNOSIS — I25118 Atherosclerotic heart disease of native coronary artery with other forms of angina pectoris: Secondary | ICD-10-CM

## 2014-03-19 DIAGNOSIS — I209 Angina pectoris, unspecified: Secondary | ICD-10-CM | POA: Diagnosis not present

## 2014-03-19 DIAGNOSIS — E785 Hyperlipidemia, unspecified: Secondary | ICD-10-CM

## 2014-03-19 DIAGNOSIS — I2510 Atherosclerotic heart disease of native coronary artery without angina pectoris: Secondary | ICD-10-CM

## 2014-03-19 DIAGNOSIS — Z79899 Other long term (current) drug therapy: Secondary | ICD-10-CM | POA: Diagnosis not present

## 2014-03-19 DIAGNOSIS — I10 Essential (primary) hypertension: Secondary | ICD-10-CM | POA: Diagnosis not present

## 2014-03-19 NOTE — Patient Instructions (Signed)
Continue your current therapy  I will see you in 6 months.   

## 2014-03-19 NOTE — Progress Notes (Signed)
[name] Date of Birth: April 30, 1956 Medical Record #[id number]  History of Present Illness: [name] is seen today for a followup visit. She had remote coronary bypass surgery in 2002. She had prior stenting of the vein graft to the obtuse marginal vessel in 2009. She then had a drug-eluting stent placement to this same saphenous vein graft to the ramus in March of 2012. This subsequently occluded and she underwent angioplasty and drug-eluting stent to the ramus intermediate branch in September of 2012. Cardiac catheterization again in October of 2012 showed nonobstructive disease. She also has a history of inflammatory bowel disease and COPD. She states she is no longer smoking. Since her last visit she has had only one episode of chest pain related to stress. This was relieved with sl Ntg.  Current Outpatient Prescriptions on File Prior to Visit  Medication Sig Dispense Refill  . ALPRAZolam (XANAX) 1 MG tablet Take 1 tablet (1 mg total) by mouth 3 (three) times daily as needed for sleep.  90 tablet  0  . amLODipine (NORVASC) 5 MG tablet Take 1 tablet (5 mg total) by mouth every evening.  90 tablet  3  . clopidogrel (PLAVIX) 75 MG tablet Take 1 tablet (75 mg total) by mouth every evening.  90 tablet  1  . dexlansoprazole (DEXILANT) 60 MG capsule Take 1 capsule (60 mg total) by mouth daily.  30 capsule  2  . diphenhydramine-acetaminophen (TYLENOL PM) 25-500 MG TABS Take 1 tablet by mouth at bedtime as needed (pain, sleep).      Marland Kitchen HYDROcodone-acetaminophen (NORCO) 10-325 MG per tablet Take 1 tablet by mouth every 4 (four) hours as needed.  180 tablet  0  . levothyroxine (SYNTHROID, LEVOTHROID) 25 MCG tablet Take 1 tablet (25 mcg total) by mouth every evening.  90 tablet  3  . metoprolol succinate (TOPROL-XL) 50 MG 24 hr tablet Take 1 tablet (50 mg total) by mouth daily. Take with or immediately following a meal.  90 tablet  3  . nitroGLYCERIN (NITROSTAT) 0.4 MG SL tablet Place 1 tablet (0.4 mg  total) under the tongue every 5 (five) minutes as needed (up to 3 doses). For chest pain  25 tablet  4  . rosuvastatin (CRESTOR) 20 MG tablet Take 1 tablet (20 mg total) by mouth every evening.  90 tablet  3   Current Facility-Administered Medications on File Prior to Visit  Medication Dose Route Frequency Provider Last Rate Last Dose  . bupivacaine (MARCAINE) 0.5 % 15 mL, phenazopyridine (PYRIDIUM) 400 mg bladder mixture   Bladder Instillation Once Reece Packer, MD        Allergies  Allergen Reactions  . Ranitidine Hives  . Sulfa Antibiotics Other (See Comments)    Childhood reaction    Past Medical History  Diagnosis Date  . Chest pain, cardiac     Has chronic chest pain.   . Coronary artery disease     a) s/p CABG x 2 in [place] 2002. b)stenting to the SVG-OM in 2009. c)DES to the saphenous vein graft to the ramus in March 2012. d) s/p PTCA/DES to native ramus intermedius in September 2012. Last cath 03/2011 with minimal nonobstructive disease  e) indeterminant myoview 09/2011 but atypical CP & no perfusion defect in ramus intermediate distribution, EF 52%  . Hypertension   . Hyperlipidemia   . Tobacco abuse   . Hypothyroidism   . Hx: UTI (urinary tract infection)     multiple  . Enterocolitis   . Gastroparesis   .  Complication of anesthesia     "my blood pressure will drop out on me"  . 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 Anemia   . Blood transfusion   . GERD (gastroesophageal reflux disease)   . Hematuria 09/25/11    "lately"  . GI bleeding   . Anxiety   . Depression   . PTSD (post-traumatic stress disorder)   . Migraines     h/o    Past Surgical History  Procedure Laterality Date  . Vesicovaginal fistula closure w/ tah  1983  . Tmj arthroplasty  1980's    right side; "I've had to have it twice"  . Coronary artery bypass graft  2002    CABG X2  . Coronary angioplasty with stent placement      "lots; they've all collapsed; last  time dr took native vein &  turned it around & put stents in it before attaching"  . Abdominal hysterectomy  1980's  . Salpingoophorectomy  1980's    "2 surgeries; 1st right; then left"  . Biopsy stomach  09/25/11    "several in the last year; all negative; via endoscopy"  . Pilonidal cyst / sinus excision  1980's    "was wrapped around my spinal cord; Dr. Norville Haggard got almost all of it except a tiny bit; said it was really deep; packed fatty tissue in the hole; really bothers me alot"  . Cholecystectomy  2000's  . Breast surgery      right biopsy  . Cysto with hydrodistension  03/28/2012    Procedure: CYSTOSCOPY/HYDRODISTENSION;  Surgeon: Reece Packer, MD;  Location: [facility];  Service: Urology;  Laterality: N/A;  . Cystoscopy with injection  03/28/2012    Procedure: CYSTOSCOPY WITH INJECTION;  Surgeon: Reece Packer, MD;  Location: [facility];  Service: Urology;  Laterality: N/A;  Marcaine and Pyridium  . Cystoscopy w/ retrogrades  03/28/2012    Procedure: CYSTOSCOPY WITH RETROGRADE PYELOGRAM;  Surgeon: Reece Packer, MD;  Location: [facility];  Service: Urology;  Laterality: Bilateral;    History  Smoking status  . Former Smoker -- 0.25 packs/day for 15 years  . Types: Cigarettes  Smokeless tobacco  . Never Used    History  Alcohol Use No    Family History  Problem Relation Age of Onset  . Heart disease Father   . Emphysema Mother     smoked  . Asthma Mother   . Heart disease Paternal Grandfather   . Breast cancer Mother     Review of Systems: The review of systems is  positive for chronic pain. She also has a history of anxiety and depression.  All other systems were reviewed and are negative.  Physical Exam: BP 130/90  Pulse 78  Ht 5' (1.524 m)  Wt 154 lb (69.854 kg)  BMI 30.08 kg/m2 Patient is alert and in no acute distress. Skin is warm and dry. Color is normal.  HEENT is unremarkable. Normocephalic/atraumatic. PERRL. Sclera are nonicteric. Neck is supple.  No masses. No JVD. Lungs are coarse. Cardiac exam shows a regular rate and rhythm. Abdomen is soft. Extremities are without edema. Gait and ROM are intact. No gross neurologic deficits noted.  LABORATORY DATA:   Lab Results  Component Value Date   WBC 13.4* 12/25/2013   HGB 15.5* 12/25/2013   HCT 44.7 12/25/2013   PLT 237 12/25/2013   GLUCOSE 98 12/31/2013   CHOL 152 06/19/2012   TRIG  185.0* 06/19/2012   HDL 46.40 06/19/2012   LDLCALC 69 06/19/2012   ALT 12 12/31/2013   AST 12 12/31/2013   NA 136 12/31/2013   K 5.0 12/31/2013   CL 100 12/31/2013   CREATININE 0.7 12/31/2013   BUN 11 12/31/2013   CO2 28 12/31/2013   TSH 3.37 06/19/2012   INR 1.07 09/25/2011   HGBA1C 5.7* 03/08/2011     Assessment / Plan: 1. Coronary disease with previous bypass and multiple interventions as noted above. Symptomatically she is doing quite well. I would recommend that she remain on Plavix indefinitely. I will followup again in 6 months.  2. Hypertension, controlled.  3. Tobacco abuse. Quit since December. Encourage continued cessation.  4. Hyperlipidemia.  5. Inflammatory bowel disease.  6. COPD

## 2014-03-22 ENCOUNTER — Other Ambulatory Visit: Payer: Self-pay

## 2014-03-22 MED ORDER — CLOPIDOGREL BISULFATE 75 MG PO TABS: 75.0000 mg | ORAL_TABLET | Freq: Every evening | ORAL | Status: DC

## 2014-03-26 ENCOUNTER — Ambulatory Visit (INDEPENDENT_AMBULATORY_CARE_PROVIDER_SITE_OTHER): Payer: Medicare Other

## 2014-03-26 DIAGNOSIS — Z23 Encounter for immunization: Secondary | ICD-10-CM

## 2014-04-05 ENCOUNTER — Encounter: Payer: Self-pay | Admitting: Internal Medicine

## 2014-04-07 ENCOUNTER — Other Ambulatory Visit: Payer: Self-pay | Admitting: Internal Medicine

## 2014-04-16 ENCOUNTER — Encounter: Payer: Self-pay | Admitting: Family Medicine

## 2014-04-16 ENCOUNTER — Ambulatory Visit (INDEPENDENT_AMBULATORY_CARE_PROVIDER_SITE_OTHER): Payer: Medicare Other | Admitting: Family Medicine

## 2014-04-16 VITALS — BP 126/84 | HR 96 | Temp 98.4°F | Wt 153.0 lb

## 2014-04-16 DIAGNOSIS — F419 Anxiety disorder, unspecified: Secondary | ICD-10-CM | POA: Diagnosis not present

## 2014-04-16 DIAGNOSIS — I2510 Atherosclerotic heart disease of native coronary artery without angina pectoris: Secondary | ICD-10-CM

## 2014-04-16 DIAGNOSIS — R05 Cough: Secondary | ICD-10-CM

## 2014-04-16 DIAGNOSIS — R059 Cough, unspecified: Secondary | ICD-10-CM

## 2014-04-16 MED ORDER — ALPRAZOLAM 1 MG PO TABS: 1.0000 mg | ORAL_TABLET | Freq: Three times a day (TID) | ORAL | Status: DC | PRN

## 2014-04-16 MED ORDER — PROMETHAZINE HCL 25 MG PO TABS: 25.0000 mg | ORAL_TABLET | ORAL | Status: DC | PRN

## 2014-04-16 MED ORDER — DOXYCYCLINE HYCLATE 100 MG PO TABS: 100.0000 mg | ORAL_TABLET | Freq: Two times a day (BID) | ORAL | Status: DC

## 2014-04-16 MED ORDER — HYDROCODONE-ACETAMINOPHEN 10-325 MG PO TABS: 1.0000 | ORAL_TABLET | ORAL | Status: DC | PRN

## 2014-04-16 NOTE — Progress Notes (Signed)
Pre visit review using our clinic review tool, if applicable. No additional management support is needed unless otherwise documented below in the visit note.  No recent NTG use.  Has had f/u with cards.    She had been coughing since 02/2014.  Taking nyquil and tussin.  She was getting some better.  By the time she got to cards OV in 02/2014, it was almost resolved. "I was feeling good then."  Had a flu shot 03/26/14.  Was trying to get well before she has her PNA shot in 04/2014.  We talked about the timing of the flu shot, she was likely going to have more cough even w/o the flu shot, ie sx are likely not from the shot.  She does have cough with discolored sputum.  No fevers.  No ST, no ear pain now.  Had some R sided ear pain prev, resolved now.    Former smoker. She had PFTs 2013 with Dr. Melvyn Novas, didn't have COPD by PFTs.    She needed a refill on her hydrocodone, xanax and phenergan.  Done at [place].   Meds, vitals, and allergies reviewed.   ROS: See HPI.  Otherwise, noncontributory.  GEN: nad, alert and oriented HEENT: mucous membranes moist, tm w/o erythema, nasal exam w/o erythema, scant clear discharge noted,  OP with mild cobblestoning, sinuses not ttp NECK: supple w/o LA CV: rrr.   PULM: ctab, no inc wob, no wheeze EXT: no edema SKIN: no acute rash

## 2014-04-16 NOTE — Patient Instructions (Signed)
Start the doxycycline, try to get some rest, and the cough should improve.  Take care.  Notify Baity if the cough isn't better.

## 2014-04-18 DIAGNOSIS — R051 Acute cough: Secondary | ICD-10-CM | POA: Insufficient documentation

## 2014-04-18 DIAGNOSIS — R059 Cough, unspecified: Secondary | ICD-10-CM | POA: Insufficient documentation

## 2014-04-18 DIAGNOSIS — R05 Cough: Secondary | ICD-10-CM | POA: Insufficient documentation

## 2014-04-18 NOTE — Assessment & Plan Note (Signed)
Former smoker, no COPD on PFTs, with persistent cough with discolored sputum.  Nontoxic.  D/w pt.  Would start doxy, f/u prn if not better.  She agrees.  Okay for outpatient f/u.

## 2014-04-19 ENCOUNTER — Other Ambulatory Visit: Payer: Self-pay | Admitting: Internal Medicine

## 2014-04-19 DIAGNOSIS — R112 Nausea with vomiting, unspecified: Secondary | ICD-10-CM

## 2014-04-19 DIAGNOSIS — K219 Gastro-esophageal reflux disease without esophagitis: Secondary | ICD-10-CM

## 2014-04-19 MED ORDER — DEXLANSOPRAZOLE 60 MG PO CPDR: 60.0000 mg | DELAYED_RELEASE_CAPSULE | Freq: Every day | ORAL | Status: DC

## 2014-04-29 ENCOUNTER — Ambulatory Visit: Payer: Medicare Other

## 2014-05-17 ENCOUNTER — Telehealth: Payer: Self-pay

## 2014-05-17 DIAGNOSIS — F419 Anxiety disorder, unspecified: Secondary | ICD-10-CM

## 2014-05-17 MED ORDER — ALPRAZOLAM 1 MG PO TABS: 1.0000 mg | ORAL_TABLET | Freq: Three times a day (TID) | ORAL | Status: DC | PRN

## 2014-05-17 MED ORDER — HYDROCODONE-ACETAMINOPHEN 10-325 MG PO TABS: 1.0000 | ORAL_TABLET | ORAL | Status: DC | PRN

## 2014-05-17 NOTE — Telephone Encounter (Signed)
Pt left v/m requesting rx for hydrocodone apap and alprazolam. Call when ready for pick up.

## 2014-05-17 NOTE — Telephone Encounter (Signed)
Rx printed and signed and givento melanie- UDS reviewed

## 2014-05-18 NOTE — Telephone Encounter (Signed)
Rx left in front office for pick up and pt is aware  

## 2014-05-22 ENCOUNTER — Encounter (HOSPITAL_COMMUNITY): Payer: Self-pay | Admitting: *Deleted

## 2014-05-22 ENCOUNTER — Emergency Department (HOSPITAL_COMMUNITY)
Admission: EM | Admit: 2014-05-22 | Discharge: 2014-05-23 | Discharge status: Against Medical Advice | Payer: Medicare Other | Attending: Emergency Medicine | Admitting: Emergency Medicine

## 2014-05-22 DIAGNOSIS — I10 Essential (primary) hypertension: Secondary | ICD-10-CM | POA: Diagnosis not present

## 2014-05-22 DIAGNOSIS — R05 Cough: Secondary | ICD-10-CM | POA: Insufficient documentation

## 2014-05-22 DIAGNOSIS — M542 Cervicalgia: Secondary | ICD-10-CM | POA: Insufficient documentation

## 2014-05-22 DIAGNOSIS — M25559 Pain in unspecified hip: Secondary | ICD-10-CM | POA: Diagnosis not present

## 2014-05-22 DIAGNOSIS — I2510 Atherosclerotic heart disease of native coronary artery without angina pectoris: Secondary | ICD-10-CM | POA: Diagnosis not present

## 2014-05-22 DIAGNOSIS — R51 Headache: Secondary | ICD-10-CM | POA: Diagnosis not present

## 2014-05-22 DIAGNOSIS — M791 Myalgia: Secondary | ICD-10-CM | POA: Diagnosis present

## 2014-05-22 LAB — I-STAT CG4 LACTIC ACID, ED: LACTIC ACID, VENOUS: 1.75 mmol/L (ref 0.5–2.2)

## 2014-05-22 LAB — CBC WITH DIFFERENTIAL/PLATELET
BASOS PCT: 0 % (ref 0–1)
Basophils Absolute: 0 10*3/uL (ref 0.0–0.1)
Eosinophils Absolute: 0.1 10*3/uL (ref 0.0–0.7)
Eosinophils Relative: 1 % (ref 0–5)
HCT: 42 % (ref 36.0–46.0)
Hemoglobin: 14.1 g/dL (ref 12.0–15.0)
LYMPHS ABS: 2.5 10*3/uL (ref 0.7–4.0)
LYMPHS PCT: 23 % (ref 12–46)
MCH: 31.5 pg (ref 26.0–34.0)
MCHC: 33.6 g/dL (ref 30.0–36.0)
MCV: 94 fL (ref 78.0–100.0)
MONOS PCT: 3 % (ref 3–12)
Monocytes Absolute: 0.3 10*3/uL (ref 0.1–1.0)
Neutro Abs: 7.9 10*3/uL — ABNORMAL HIGH (ref 1.7–7.7)
Neutrophils Relative %: 73 % (ref 43–77)
Platelets: ADEQUATE 10*3/uL (ref 150–400)
RBC: 4.47 MIL/uL (ref 3.87–5.11)
RDW: 12.7 % (ref 11.5–15.5)
WBC: 10.8 10*3/uL — AB (ref 4.0–10.5)

## 2014-05-22 LAB — COMPREHENSIVE METABOLIC PANEL
ALK PHOS: 54 U/L (ref 39–117)
ALT: 14 U/L (ref 0–35)
AST: 19 U/L (ref 0–37)
Albumin: 3.8 g/dL (ref 3.5–5.2)
Anion gap: 12 (ref 5–15)
BUN: 9 mg/dL (ref 6–23)
CHLORIDE: 103 meq/L (ref 96–112)
CO2: 24 mEq/L (ref 19–32)
Calcium: 9 mg/dL (ref 8.4–10.5)
Creatinine, Ser: 0.56 mg/dL (ref 0.50–1.10)
GLUCOSE: 98 mg/dL (ref 70–99)
POTASSIUM: 4.1 meq/L (ref 3.7–5.3)
Sodium: 139 mEq/L (ref 137–147)
Total Protein: 6.5 g/dL (ref 6.0–8.3)

## 2014-05-22 NOTE — ED Notes (Signed)
Pt leaving AMA.  Does not want to stay.

## 2014-05-22 NOTE — ED Notes (Signed)
Pt in c/o generalized body aches, mild cough, also c/o neck pain and a headache, normal ROM of neck noted in triage

## 2014-05-23 ENCOUNTER — Emergency Department (HOSPITAL_COMMUNITY)
Admission: EM | Admit: 2014-05-23 | Discharge: 2014-05-23 | Discharge status: Against Medical Advice | Payer: Medicare Other | Attending: Emergency Medicine | Admitting: Emergency Medicine

## 2014-05-23 ENCOUNTER — Encounter (HOSPITAL_COMMUNITY): Payer: Self-pay

## 2014-05-23 DIAGNOSIS — I10 Essential (primary) hypertension: Secondary | ICD-10-CM | POA: Diagnosis not present

## 2014-05-23 DIAGNOSIS — I2510 Atherosclerotic heart disease of native coronary artery without angina pectoris: Secondary | ICD-10-CM | POA: Diagnosis not present

## 2014-05-23 DIAGNOSIS — M25559 Pain in unspecified hip: Secondary | ICD-10-CM | POA: Diagnosis not present

## 2014-05-23 NOTE — ED Notes (Signed)
Per Registration pt left due to wait.  Pt did not speak with RN.  Attempted to call pt's cellphone number to inform her she was next to go to treatment room and pt did not answer.

## 2014-05-23 NOTE — ED Notes (Addendum)
Lt. Shoulder pain began approximately 3 months ago.  Pt. Is not a surgical candidate and pt. Is having lt. Hip pain, which began 3 weeks ago along feeling weak on her lt. Side.  She is unable to pick any thing up with her lt. Arm.  Pt. Took her Vicodin last night but is not helping

## 2014-05-25 NOTE — Telephone Encounter (Signed)
Pt has to file a police report. No early refills will be given

## 2014-05-25 NOTE — Telephone Encounter (Addendum)
Pt left v/m; pt had several people at her home for Thanksgiving Day dinner and now pt cannot find any of her alprazolam and pt had fixed med box so pt has 82 pills pf hydrocodone apap; 98 pills of hydrocodone apap was taken . Pt cannot prove someone took her med but pt said medication is gone. Pt does not know what to do; pt said if she stops cold [place] she will be very sick, if out of xanax pt has seizures; pt wants to know if can get new rx or does pt have to file a police report or what to do? Pt request cb.[facility] [employer].

## 2014-05-26 ENCOUNTER — Telehealth: Payer: Self-pay | Admitting: Internal Medicine

## 2014-05-26 NOTE — Telephone Encounter (Signed)
Left detailed msg on VM per HIPAA letting pt know Rx will not be refilled early

## 2014-05-26 NOTE — Telephone Encounter (Signed)
Pt dropped off police [place] police report Signed medicare release form for controlled substance contract

## 2014-05-26 NOTE — Telephone Encounter (Signed)
Please call patient to let her know if her prescriptions will be refilled.

## 2014-05-26 NOTE — Telephone Encounter (Signed)
Patient Overview  Current as of: 05/26/2014 1:47 PM   Name: [name], [name]  MRN: [id number]   Birth Date: 28-Jun-1955 - 58 y.o.  Sex: Female       Demographics      Address: [place]    [place] [address]    Home Phone:  [phone]   Work Phone:  (Not on file)   Mobile:  [phone]             SSN:  [id number]   PCP:  Jearld Fenton, NP   E-mail:  [email or web address]    Ethnicity:  Not Hispanic or Latino   Race:  White or Caucasian   Religion:  Baptist   Preferred language:  English          Medical Record Comments     None       Potential Duplicate Patients     Showing results for configuration: Standard Identity Duplicate Configuration [[id number]]    No potential duplicates have been identified    Search for potential duplicates      Known Non-Duplicates     No active KNDs on file.     Medical Record Numbers     [facility] Id Number [id number]    Service Area Mrn [id number]    Initiate Enterprise Id [phone]    [place] [phone]

## 2014-05-26 NOTE — Telephone Encounter (Signed)
Left message on voicemail.

## 2014-05-26 NOTE — Telephone Encounter (Signed)
Opened in error another phone note

## 2014-06-04 ENCOUNTER — Telehealth: Payer: Self-pay

## 2014-06-04 ENCOUNTER — Ambulatory Visit: Payer: Medicare Other

## 2014-06-04 NOTE — Telephone Encounter (Signed)
Pneumovax if she has not had it within the last 5 years

## 2014-06-04 NOTE — Telephone Encounter (Addendum)
Pt scheduled nurse visit 06/09/14 for pneumonia shot; pt first said she gets pneumonia shot every year and then she said she know she got a pneumonia shot at [facility]. Pt will try to get date of pneumonia shot; which pneumonia vaccine do you want pt to have.?

## 2014-06-09 ENCOUNTER — Ambulatory Visit: Payer: Medicare Other

## 2014-06-09 NOTE — Telephone Encounter (Signed)
Spoke with pt this morning and she is not feeling well;S/T and pt cancelled nurse visit today and will cb to reschedule; pt will try to find out if had pneumonia shot before and if so when.

## 2014-06-11 ENCOUNTER — Other Ambulatory Visit: Payer: Self-pay

## 2014-06-11 DIAGNOSIS — F419 Anxiety disorder, unspecified: Secondary | ICD-10-CM

## 2014-06-11 NOTE — Telephone Encounter (Signed)
Pt left v/m requesting rx hydrocodone apap and alprazolam. Call when ready for pick up. Pt understands cannot pick up until the 23rd but was not sure of holiday office hours.

## 2014-06-14 MED ORDER — ALPRAZOLAM 1 MG PO TABS: 1.0000 mg | ORAL_TABLET | Freq: Three times a day (TID) | ORAL | Status: DC | PRN

## 2014-06-14 MED ORDER — HYDROCODONE-ACETAMINOPHEN 10-325 MG PO TABS: 1.0000 | ORAL_TABLET | ORAL | Status: DC | PRN

## 2014-06-14 NOTE — Telephone Encounter (Signed)
Rx left in front office for pick up and pt is aware  

## 2014-06-14 NOTE — Telephone Encounter (Signed)
UDS reviewed, not due. RX printed and signed and placed in melanies back

## 2014-07-15 ENCOUNTER — Other Ambulatory Visit: Payer: Self-pay | Admitting: Internal Medicine

## 2014-07-15 MED ORDER — HYDROCODONE-ACETAMINOPHEN 10-325 MG PO TABS: 1.0000 | ORAL_TABLET | ORAL | Status: DC | PRN

## 2014-07-15 NOTE — Telephone Encounter (Signed)
Left message on voicemail Rx left in front office for pick up  

## 2014-07-15 NOTE — Telephone Encounter (Signed)
RX printed and signed and placed in melanies basket

## 2014-07-15 NOTE — Telephone Encounter (Signed)
Pt left note at Triage. Pt is requesting refill of Rx and if possible to be picked up today. Pt said it's not due until 07/17/14 but she is afraid of the snow storm, and that she will not be able to get it on time. Pt request call back

## 2014-07-19 ENCOUNTER — Telehealth: Payer: Self-pay | Admitting: *Deleted

## 2014-07-19 DIAGNOSIS — F419 Anxiety disorder, unspecified: Secondary | ICD-10-CM

## 2014-07-19 MED ORDER — ALPRAZOLAM 1 MG PO TABS: 1.0000 mg | ORAL_TABLET | Freq: Three times a day (TID) | ORAL | Status: DC | PRN

## 2014-07-19 NOTE — Telephone Encounter (Signed)
RX printed and signed, put in melanies box

## 2014-07-19 NOTE — Telephone Encounter (Signed)
Pt left message requesting alprazolam rx. She request call when ready so she can pick up rx here at office

## 2014-07-20 NOTE — Telephone Encounter (Signed)
Pt is aware.Marland KitchenRx called into pharmacy placed in front office for pick up

## 2014-07-21 NOTE — Telephone Encounter (Signed)
Per Robin--pt is NOT aware and left a name who is picking it up and to call when ready to pick--person is not on HIPAA, so unable to lm--did call number on file for pt and lmovm---Rx left in front office for pick up

## 2014-07-23 NOTE — Telephone Encounter (Signed)
Pt picked up Rx 07/21/14

## 2014-08-16 ENCOUNTER — Other Ambulatory Visit: Payer: Self-pay

## 2014-08-16 DIAGNOSIS — F419 Anxiety disorder, unspecified: Secondary | ICD-10-CM

## 2014-08-16 MED ORDER — ALPRAZOLAM 1 MG PO TABS: 1.0000 mg | ORAL_TABLET | Freq: Three times a day (TID) | ORAL | Status: DC | PRN

## 2014-08-16 MED ORDER — HYDROCODONE-ACETAMINOPHEN 10-325 MG PO TABS: 1.0000 | ORAL_TABLET | ORAL | Status: DC | PRN

## 2014-08-16 NOTE — Telephone Encounter (Signed)
RX printed and signed and placed in MYD box 

## 2014-08-16 NOTE — Telephone Encounter (Signed)
Pt left v/m requesting rx for alprazolam and hydrocodone apap. Call when ready for pick up. Pt last seen 04/16/14 and no future appt scheduled. Alprazolam last printed 07/19/14 and hyrdocodone apap last printed on 07/15/14.Please advise.

## 2014-08-16 NOTE — Telephone Encounter (Signed)
Left message on voicemail Rx left in front office for pick up  

## 2014-09-15 ENCOUNTER — Other Ambulatory Visit: Payer: Self-pay

## 2014-09-15 DIAGNOSIS — F419 Anxiety disorder, unspecified: Secondary | ICD-10-CM

## 2014-09-15 MED ORDER — ALPRAZOLAM 1 MG PO TABS: 1.0000 mg | ORAL_TABLET | Freq: Three times a day (TID) | ORAL | Status: DC | PRN

## 2014-09-15 MED ORDER — HYDROCODONE-ACETAMINOPHEN 10-325 MG PO TABS: 1.0000 | ORAL_TABLET | ORAL | Status: DC | PRN

## 2014-09-15 NOTE — Telephone Encounter (Signed)
RX printed and signed, placed in MYD box, ok to phone in xanax. I want her to do a drug screen.

## 2014-09-15 NOTE — Telephone Encounter (Signed)
Pt left v/m requesting rx hydrocodone apap and alprazolam.Call when ready for pick up.  

## 2014-09-15 NOTE — Telephone Encounter (Signed)
Left message on voicemail Norco Rx placed in front office--will call in Xanax after pt comes to pick up Rx CSA renewal needed

## 2014-09-16 ENCOUNTER — Encounter: Payer: Self-pay | Admitting: Cardiology

## 2014-09-16 ENCOUNTER — Encounter: Payer: Self-pay | Admitting: Internal Medicine

## 2014-09-16 ENCOUNTER — Ambulatory Visit (INDEPENDENT_AMBULATORY_CARE_PROVIDER_SITE_OTHER): Payer: Medicare Other | Admitting: Cardiology

## 2014-09-16 VITALS — BP 108/80 | HR 79 | Ht 60.0 in | Wt 151.8 lb

## 2014-09-16 DIAGNOSIS — F17200 Nicotine dependence, unspecified, uncomplicated: Secondary | ICD-10-CM

## 2014-09-16 DIAGNOSIS — I25118 Atherosclerotic heart disease of native coronary artery with other forms of angina pectoris: Secondary | ICD-10-CM

## 2014-09-16 DIAGNOSIS — I10 Essential (primary) hypertension: Secondary | ICD-10-CM

## 2014-09-16 DIAGNOSIS — Z720 Tobacco use: Secondary | ICD-10-CM | POA: Diagnosis not present

## 2014-09-16 DIAGNOSIS — Z79891 Long term (current) use of opiate analgesic: Secondary | ICD-10-CM | POA: Diagnosis not present

## 2014-09-16 DIAGNOSIS — E785 Hyperlipidemia, unspecified: Secondary | ICD-10-CM

## 2014-09-16 DIAGNOSIS — I2510 Atherosclerotic heart disease of native coronary artery without angina pectoris: Secondary | ICD-10-CM | POA: Diagnosis not present

## 2014-09-16 DIAGNOSIS — I209 Angina pectoris, unspecified: Secondary | ICD-10-CM | POA: Diagnosis not present

## 2014-09-16 DIAGNOSIS — Z79899 Other long term (current) drug therapy: Secondary | ICD-10-CM | POA: Diagnosis not present

## 2014-09-16 LAB — LIPID PANEL
Cholesterol: 127 mg/dL (ref 0–200)
HDL: 32 mg/dL — ABNORMAL LOW (ref >=46)
LDL CALC: 46 mg/dL (ref 0–99)
TRIGLYCERIDES: 243 mg/dL — AB (ref <150)
Total CHOL/HDL Ratio: 4 Ratio
VLDL: 49 mg/dL — AB (ref 0–40)

## 2014-09-16 LAB — HEPATIC FUNCTION PANEL
ALT: 14 U/L (ref 0–35)
AST: 15 U/L (ref 0–37)
Albumin: 4.9 g/dL (ref 3.5–5.2)
Alkaline Phosphatase: 64 U/L (ref 39–117)
BILIRUBIN DIRECT: 0.1 mg/dL (ref 0.0–0.3)
Indirect Bilirubin: 0.3 mg/dL (ref 0.2–1.2)
Total Bilirubin: 0.4 mg/dL (ref 0.2–1.2)
Total Protein: 7.1 g/dL (ref 6.0–8.3)

## 2014-09-16 MED ORDER — NITROGLYCERIN 0.4 MG SL SUBL: 0.4000 mg | SUBLINGUAL_TABLET | SUBLINGUAL | Status: DC | PRN

## 2014-09-16 NOTE — Patient Instructions (Signed)
Continue your current therapy  We will check fasting lipids today  I will see you in 6 months.

## 2014-09-16 NOTE — Progress Notes (Signed)
[name] Date of Birth: Dec 18, 1955 Medical Record #[id number]  History of Present Illness: [name] is seen today for follow up CAD. She had remote coronary bypass surgery in 2002. She had prior stenting of the vein graft to the obtuse marginal vessel in 2009. She then had a drug-eluting stent placement to this same saphenous vein graft to the ramus in March of 2012. This subsequently occluded and she underwent angioplasty and drug-eluting stent to the ramus intermediate branch in September of 2012. Cardiac catheterization again in October of 2012 showed nonobstructive disease. Her last evaluation with [name] in July 2015 showed no ischemia and normal EF. She also has a history of inflammatory bowel disease and COPD. She states she is still smoking. She reports that this past Sat. And Sun. She experienced symptoms of chest pain like she has had before. This was a sharp pain like a "Charlie Horse". It was associated with nausea and SOB. She took 2 sl Ntg and it eventually went away. Felt better Monday. She is having a lot of shoulder pain and limitation of motion in left arm. She has chronic back pain and states she is trying to take less pain medication and Xanax,   Current Outpatient Prescriptions on File Prior to Visit  Medication Sig Dispense Refill  . ALPRAZolam (XANAX) 1 MG tablet Take 1 tablet (1 mg total) by mouth 3 (three) times daily as needed for sleep. 90 tablet 0  . amLODipine (NORVASC) 5 MG tablet Take 1 tablet (5 mg total) by mouth every evening. 90 tablet 3  . clopidogrel (PLAVIX) 75 MG tablet Take 1 tablet (75 mg total) by mouth every evening. 90 tablet 1  . diphenhydramine-acetaminophen (TYLENOL PM) 25-500 MG TABS Take 1 tablet by mouth at bedtime as needed (pain, sleep).    Marland Kitchen HYDROcodone-acetaminophen (NORCO) 10-325 MG per tablet Take 1 tablet by mouth every 4 (four) hours as needed. 180 tablet 0  . levothyroxine (SYNTHROID, LEVOTHROID) 25 MCG tablet Take 1 tablet (25 mcg  total) by mouth every evening. 90 tablet 3  . metoprolol succinate (TOPROL-XL) 50 MG 24 hr tablet Take 1 tablet (50 mg total) by mouth daily. Take with or immediately following a meal. 90 tablet 3  . promethazine (PHENERGAN) 25 MG tablet Take 1 tablet (25 mg total) by mouth as needed. For nausea 30 tablet 0  . rosuvastatin (CRESTOR) 20 MG tablet Take 1 tablet (20 mg total) by mouth every evening. 90 tablet 3   Current Facility-Administered Medications on File Prior to Visit  Medication Dose Route Frequency Provider Last Rate Last Dose  . bupivacaine (MARCAINE) 0.5 % 15 mL, phenazopyridine (PYRIDIUM) 400 mg bladder mixture   Bladder Instillation Once Bjorn Loser, MD        Allergies  Allergen Reactions  . Ranitidine Hives  . Sulfa Antibiotics Other (See Comments)    Childhood reaction    Past Medical History  Diagnosis Date  . Chest pain, cardiac     Has chronic chest pain.   . Coronary artery disease     a) s/p CABG x 2 in [place] 2002. b)stenting to the SVG-OM in 2009. c)DES to the saphenous vein graft to the ramus in March 2012. d) s/p PTCA/DES to native ramus intermedius in September 2012. Last cath 03/2011 with minimal nonobstructive disease  e) indeterminant myoview 09/2011 but atypical CP & no perfusion defect in ramus intermediate distribution, EF 52%  . Hypertension   . Hyperlipidemia   . Tobacco abuse   .  Hypothyroidism   . Hx: UTI (urinary tract infection)     multiple  . Enterocolitis   . Gastroparesis   . Complication of anesthesia     "my blood pressure will drop out on me"  . 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 Anemia   . Blood transfusion   . GERD (gastroesophageal reflux disease)   . Hematuria 09/25/11    "lately"  . GI bleeding   . Anxiety   . Depression   . PTSD (post-traumatic stress disorder)   . Migraines     h/o    Past Surgical History  Procedure Laterality Date  . Vesicovaginal fistula closure w/ tah  1983  . Tmj  arthroplasty  1980's    right side; "I've had to have it twice"  . Coronary artery bypass graft  2002    CABG X2  . Coronary angioplasty with stent placement      "lots; they've all collapsed; last time dr took native vein &  turned it around & put stents in it before attaching"  . Abdominal hysterectomy  1980's  . Salpingoophorectomy  1980's    "2 surgeries; 1st right; then left"  . Biopsy stomach  09/25/11    "several in the last year; all negative; via endoscopy"  . Pilonidal cyst / sinus excision  1980's    "was wrapped around my spinal cord; Dr. Norville Haggard got almost all of it except a tiny bit; said it was really deep; packed fatty tissue in the hole; really bothers me alot"  . Cholecystectomy  2000's  . Breast surgery      right biopsy  . Cysto with hydrodistension  03/28/2012    Procedure: CYSTOSCOPY/HYDRODISTENSION;  Surgeon: Reece Packer, MD;  Location: [facility];  Service: Urology;  Laterality: N/A;  . Cystoscopy with injection  03/28/2012    Procedure: CYSTOSCOPY WITH INJECTION;  Surgeon: Reece Packer, MD;  Location: [facility];  Service: Urology;  Laterality: N/A;  Marcaine and Pyridium  . Cystoscopy w/ retrogrades  03/28/2012    Procedure: CYSTOSCOPY WITH RETROGRADE PYELOGRAM;  Surgeon: Reece Packer, MD;  Location: [facility];  Service: Urology;  Laterality: Bilateral;    History  Smoking status  . Former Smoker -- 0.25 packs/day for 15 years  . Types: Cigarettes  Smokeless tobacco  . Never Used    History  Alcohol Use No    Family History  Problem Relation Age of Onset  . Heart disease Father   . Emphysema Mother     smoked  . Asthma Mother   . Heart disease Paternal Grandfather   . Breast cancer Mother     Review of Systems: The review of systems is  positive for chronic pain. She also has a history of anxiety and depression.  All other systems were reviewed and are negative.  Physical Exam: BP 108/80 mmHg  Pulse 79  Ht 5' (1.524 m)  Wt 151 lb  12.8 oz (68.856 kg)  BMI 29.65 kg/m2 Patient is alert and in no acute distress. Skin is warm and dry. Color is normal.  HEENT is unremarkable. Normocephalic/atraumatic. PERRL. Sclera are nonicteric. Neck is supple. No masses. No JVD. Lungs are clear. Cardiac exam shows a regular rate and rhythm. Normal S1-2. No gallop or murmur. Abdomen is soft. Extremities are without edema. Gait and ROM are intact. No gross neurologic deficits noted.  LABORATORY DATA:   Lab Results  Component Value Date  WBC 10.8* 05/22/2014   HGB 14.1 05/22/2014   HCT 42.0 05/22/2014   PLT  05/22/2014    PLATELET CLUMPS NOTED ON SMEAR, COUNT APPEARS ADEQUATE   GLUCOSE 98 05/22/2014   CHOL 152 06/19/2012   TRIG 185.0* 06/19/2012   HDL 46.40 06/19/2012   LDLCALC 69 06/19/2012   ALT 14 05/22/2014   AST 19 05/22/2014   NA 139 05/22/2014   K 4.1 05/22/2014   CL 103 05/22/2014   CREATININE 0.56 05/22/2014   BUN 9 05/22/2014   CO2 24 05/22/2014   TSH 3.37 06/19/2012   INR 1.07 09/25/2011   HGBA1C 5.7* 03/08/2011   Ecg today shows NSR with normal Ecg. I have personally reviewed and interpreted this study.   Assessment / Plan: 1. Coronary disease with previous bypass and multiple interventions as noted above. She did have some recent atypical chest pain similar to prior episodes. Pain resolved and Ecg is normal. Normal myoview in July 2015. Will monitor for now and continue current medication. Ntg Rx refilled.  2. Hypertension, controlled.  3. Tobacco abuse. Relapsed.  Encourage complete cessation.  4. Hyperlipidemia. Needs follow up fasting lipid panel and HFP today. Last checked in 2013. On Crestor.   5. Inflammatory bowel disease.  6. COPD  7. Shoulder pain. Most likely rotator cuff problem. Recommend she consider orthopaedic evaluation.

## 2014-09-20 ENCOUNTER — Telehealth: Payer: Self-pay | Admitting: Internal Medicine

## 2014-09-20 NOTE — Telephone Encounter (Signed)
emmi emailed °

## 2014-09-30 ENCOUNTER — Encounter: Payer: Self-pay | Admitting: Internal Medicine

## 2014-09-30 ENCOUNTER — Ambulatory Visit (INDEPENDENT_AMBULATORY_CARE_PROVIDER_SITE_OTHER): Payer: Medicare Other | Admitting: Internal Medicine

## 2014-09-30 VITALS — BP 108/76 | HR 78 | Temp 98.1°F | Wt 159.0 lb

## 2014-09-30 DIAGNOSIS — R110 Nausea: Secondary | ICD-10-CM

## 2014-09-30 DIAGNOSIS — B850 Pediculosis due to Pediculus humanus capitis: Secondary | ICD-10-CM | POA: Diagnosis not present

## 2014-09-30 DIAGNOSIS — I25118 Atherosclerotic heart disease of native coronary artery with other forms of angina pectoris: Secondary | ICD-10-CM

## 2014-09-30 MED ORDER — PROMETHAZINE HCL 25 MG PO TABS
25.0000 mg | ORAL_TABLET | ORAL | Status: DC | PRN
Start: 2014-09-30 — End: 2016-04-11

## 2014-09-30 MED ORDER — PERMETHRIN 5 % EX CREA: 1.0000 "application " | TOPICAL_CREAM | Freq: Once | CUTANEOUS | Status: DC

## 2014-09-30 NOTE — Progress Notes (Signed)
Pre visit review using our clinic review tool, if applicable. No additional management support is needed unless otherwise documented below in the visit note. 

## 2014-09-30 NOTE — Progress Notes (Signed)
Subjective:    Patient ID: [name], female    DOB: [birth year], 59 y.o.   MRN: [id number]  HPI  Pt presents to the clinic today with c/o itching of her head. She thinks she may have lice. She has had contact with her granddaughter who has just been treated for lice. This contact was 3-4 days ago. She is so stressed out about having lice that it is making her sick to her stomach. She needs to have her phenergan refilled. She has had lice in the past and has a very hard time getting it out because her hair is so long and thick.  Review of Systems      Past Medical History  Diagnosis Date  . Chest pain, cardiac     Has chronic chest pain.   . Coronary artery disease     a) s/p CABG x 2 in [place] 2002. b)stenting to the SVG-OM in 2009. c)DES to the saphenous vein graft to the ramus in March 2012. d) s/p PTCA/DES to native ramus intermedius in September 2012. Last cath 03/2011 with minimal nonobstructive disease  e) indeterminant myoview 09/2011 but atypical CP & no perfusion defect in ramus intermediate distribution, EF 52%  . Hypertension   . Hyperlipidemia   . Tobacco abuse   . Hypothyroidism   . Hx: UTI (urinary tract infection)     multiple  . Enterocolitis   . Gastroparesis   . Complication of anesthesia     "my blood pressure will drop out on me"  . 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 Anemia   . Blood transfusion   . GERD (gastroesophageal reflux disease)   . Hematuria 09/25/11    "lately"  . GI bleeding   . Anxiety   . Depression   . PTSD (post-traumatic stress disorder)   . Migraines     h/o    Current Outpatient Prescriptions  Medication Sig Dispense Refill  . ALPRAZolam (XANAX) 1 MG tablet Take 1 tablet (1 mg total) by mouth 3 (three) times daily as needed for sleep. 90 tablet 0  . amLODipine (NORVASC) 5 MG tablet Take 1 tablet (5 mg total) by mouth every evening. 90 tablet 3  . clopidogrel (PLAVIX) 75 MG tablet Take 1 tablet (75 mg  total) by mouth every evening. 90 tablet 1  . diphenhydramine-acetaminophen (TYLENOL PM) 25-500 MG TABS Take 1 tablet by mouth at bedtime as needed (pain, sleep).    Marland Kitchen HYDROcodone-acetaminophen (NORCO) 10-325 MG per tablet Take 1 tablet by mouth every 4 (four) hours as needed. 180 tablet 0  . levothyroxine (SYNTHROID, LEVOTHROID) 25 MCG tablet Take 1 tablet (25 mcg total) by mouth every evening. 90 tablet 3  . metoprolol succinate (TOPROL-XL) 50 MG 24 hr tablet Take 1 tablet (50 mg total) by mouth daily. Take with or immediately following a meal. 90 tablet 3  . nitroGLYCERIN (NITROSTAT) 0.4 MG SL tablet Place 1 tablet (0.4 mg total) under the tongue every 5 (five) minutes as needed (up to 3 doses). For chest pain 25 tablet 4  . promethazine (PHENERGAN) 25 MG tablet Take 1 tablet (25 mg total) by mouth as needed. For nausea 30 tablet 0  . rosuvastatin (CRESTOR) 20 MG tablet Take 1 tablet (20 mg total) by mouth every evening. 90 tablet 3   No current facility-administered medications for this visit.   Facility-Administered Medications Ordered in Other Visits  Medication Dose Route Frequency Provider Last  Rate Last Dose  . bupivacaine (MARCAINE) 0.5 % 15 mL, phenazopyridine (PYRIDIUM) 400 mg bladder mixture   Bladder Instillation Once Bjorn Loser, MD        Allergies  Allergen Reactions  . Ranitidine Hives  . Sulfa Antibiotics Other (See Comments)    Childhood reaction    Family History  Problem Relation Age of Onset  . Heart disease Father   . Emphysema Mother     smoked  . Asthma Mother   . Heart disease Paternal Grandfather   . Breast cancer Mother     History   Social History  . Marital Status: Widowed    Spouse Name: N/A  . Number of Children: 2  . Years of Education: N/A   Occupational History  . Disabled    Social History Main Topics  . Smoking status: Former Smoker -- 0.25 packs/day for 15 years    Types: Cigarettes  . Smokeless tobacco: Never Used  . Alcohol  Use: No  . Drug Use: Yes    Special: Marijuana     Comment: "as a teen"  . Sexual Activity: Not Currently   Other Topics Concern  . Not on file   Social History Narrative     Constitutional: Denies fever, malaise, fatigue, headache or abrupt weight changes.  Respiratory: Denies difficulty breathing, shortness of breath, cough or sputum production.   Cardiovascular: Denies chest pain, chest tightness, palpitations or swelling in the hands or feet.  Gastrointestinal: Pt reports nausea. Denies abdominal pain, bloating, constipation, diarrhea or blood in the stool.  Skin: Pt reports itching of scalp. Denies redness, rashes, lesions or ulcercations.   No other specific complaints in a complete review of systems (except as listed in HPI above).  Objective:   Physical Exam   BP 108/76 mmHg  Pulse 78  Temp(Src) 98.1 F (36.7 C) (Oral)  Wt 159 lb (72.122 kg)  SpO2 98% Wt Readings from Last 3 Encounters:  09/30/14 159 lb (72.122 kg)  09/16/14 151 lb 12.8 oz (68.856 kg)  04/16/14 153 lb (69.4 kg)    General: Appears her stated age, well developed, well nourished in NAD. Skin: Warm, dry and intact. Nits noted at the base of the hair follicles, more on the right side of the scalp. Cardiovascular: Normal rate and rhythm. S1,S2 noted.  No murmur, rubs or gallops noted.  Pulmonary/Chest: Normal effort and positive vesicular breath sounds. No respiratory distress. No wheezes, rales or ronchi noted.  Abdomen: Soft and nontender. Normal bowel sounds, no bruits noted.  Neurological: Alert and oriented.     BMET    Component Value Date/Time   NA 139 05/22/2014 2128   K 4.1 05/22/2014 2128   CL 103 05/22/2014 2128   CO2 24 05/22/2014 2128   GLUCOSE 98 05/22/2014 2128   BUN 9 05/22/2014 2128   CREATININE 0.56 05/22/2014 2128   CALCIUM 9.0 05/22/2014 2128   GFRNONAA >90 05/22/2014 2128   GFRAA >90 05/22/2014 2128    Lipid Panel     Component Value Date/Time   CHOL 127  09/16/2014 0847   TRIG 243* 09/16/2014 0847   HDL 32* 09/16/2014 0847   CHOLHDL 4.0 09/16/2014 0847   VLDL 49* 09/16/2014 0847   LDLCALC 46 09/16/2014 0847    CBC    Component Value Date/Time   WBC 10.8* 05/22/2014 2128   RBC 4.47 05/22/2014 2128   HGB 14.1 05/22/2014 2128   HCT 42.0 05/22/2014 2128   PLT  05/22/2014 2128  PLATELET CLUMPS NOTED ON SMEAR, COUNT APPEARS ADEQUATE   MCV 94.0 05/22/2014 2128   MCH 31.5 05/22/2014 2128   MCHC 33.6 05/22/2014 2128   RDW 12.7 05/22/2014 2128   LYMPHSABS 2.5 05/22/2014 2128   MONOABS 0.3 05/22/2014 2128   EOSABS 0.1 05/22/2014 2128   BASOSABS 0.0 05/22/2014 2128    Hgb A1C Lab Results  Component Value Date   HGBA1C 5.7* 03/08/2011        Assessment & Plan:   Heal lice Infestation:  Will give RX for permethrin treatment She can repeat in 10 days if needed Advised her to wash everything she can in hot water Put everything she can't wash in ziplock bags  Nausea:  Promethazine refilled today  RTC as needed or if symptoms persist or worsen

## 2014-09-30 NOTE — Patient Instructions (Signed)
Head and Pubic Lice °Lice are tiny, light brown insects with claws on the ends of their legs. They are small parasites that live on the human body. Lice often make their home in your hair. They hatch from little round eggs (nits), which are attached to the base of hairs. They spread by: °· Direct contact with an infested person. °· Infested personal items such as combs, brushes, towels, clothing, pillow cases and sheets. °The parasite that causes your condition may also live in clothes which have been worn within the week before treatment. Therefore, it is necessary to wash your clothes, bed linens, towels, combs and brushes. Any woolens can be put in an air-tight plastic bag for one week. You need to use fresh clothes, towels and sheets after your treatment is completed. Re-treatment is usually not necessary if instructions are followed. If necessary, treatment may be repeated in 7 days. The entire family may require treatment. Sexual partners should be treated if the nits are present in the pubic area. °TREATMENT °· Apply enough medicated shampoo or cream to wet hair and skin in and around the infected areas. °· Work thoroughly into hair and leave in according to instructions. °· Add a small amount of water until a good lather forms. °· Rinse thoroughly. °· Towel briskly. °· When hair is dry, any remaining nits, cream or shampoo may be removed with a fine-tooth comb or tweezers. The nits resemble dandruff; however they are glued to the hair follicle and are difficult to brush out. Frequent fine combing and shampoos are necessary. A towel soaked in white vinegar and left on the hair for 2 hours will also help soften the glue which holds the nits on the hair. °Medicated shampoo or cream should not be used on children or pregnant women without a caregiver's prescription or instructions. °SEEK MEDICAL CARE IF:  °· You or your child develops sores that look infected. °· The rash does not go away in one week. °· The  lice or nits return or persist in spite of treatment. °Document Released: 06/11/2005 Document Revised: 09/03/2011 Document Reviewed: 01/08/2007 °ExitCare® Patient Information ©2015 [employer]. This information is not intended to replace advice given to you by your health care provider. Make sure you discuss any questions you have with your health care provider. ° °

## 2014-10-06 ENCOUNTER — Telehealth: Payer: Self-pay

## 2014-10-06 NOTE — Telephone Encounter (Signed)
Pt returned call. Please call pt back

## 2014-10-06 NOTE — Telephone Encounter (Signed)
UDS collected 09/16/2014--was negative for Norco and Xanax as pt picks up Rx mthly--Xanax #90 and norco #180--per [facility], why are medications not showing up in system?  Left message on voicemail for pt to return my call

## 2014-10-15 ENCOUNTER — Other Ambulatory Visit: Payer: Self-pay

## 2014-10-15 ENCOUNTER — Telehealth: Payer: Self-pay | Admitting: Internal Medicine

## 2014-10-15 MED ORDER — AMLODIPINE BESYLATE 5 MG PO TABS: 5.0000 mg | ORAL_TABLET | Freq: Every evening | ORAL | Status: DC

## 2014-10-15 NOTE — Telephone Encounter (Signed)
Pt states she had been really sick and may have been too dehydrated during test--also had not been taking medication as much as directed---[phone] on friend's phone for return call today only

## 2014-10-15 NOTE — Telephone Encounter (Signed)
Left message on voicemail.

## 2014-10-15 NOTE — Telephone Encounter (Signed)
Patient returned your call.  Please call her back at [phone].

## 2014-10-17 NOTE — Telephone Encounter (Signed)
Even if she isn't taking them frequently as directed, she is still picking them up monthly. No more refills on controlled substances

## 2014-10-20 ENCOUNTER — Encounter: Payer: Self-pay | Admitting: Internal Medicine

## 2014-10-27 NOTE — Telephone Encounter (Signed)
Xanax and Norco has been removed from medication list

## 2014-10-27 NOTE — Telephone Encounter (Signed)
Spoke to pt and she is aware that she will not be able to get anymore controlled substance Rx prescribed from [facility] expressed understanding

## 2014-10-27 NOTE — Addendum Note (Signed)
Addended by: Lindalou Hose Y on: 10/27/2014 10:00 AM   Modules accepted: Orders, Medications

## 2014-11-29 ENCOUNTER — Telehealth: Payer: Self-pay | Admitting: *Deleted

## 2014-11-29 NOTE — Telephone Encounter (Signed)
Mammogram f/u call: pt doesn't have a mammogram on file in over 2 yrs. Called pt and no answer so left voicemail requesting pt to call office back. We need to see if pt has had a mammogram, has one scheduled, needs to schedule one or refuses to get them.

## 2014-11-30 ENCOUNTER — Telehealth: Payer: Self-pay | Admitting: Internal Medicine

## 2014-11-30 ENCOUNTER — Other Ambulatory Visit: Payer: Self-pay

## 2014-11-30 DIAGNOSIS — Z1231 Encounter for screening mammogram for malignant neoplasm of breast: Secondary | ICD-10-CM

## 2014-11-30 NOTE — Telephone Encounter (Signed)
Patient returned Shapale's call. Patient said she hasn't had her Mammogram this year.  She said she would schedule the appointment.  She said she'll ask them to send a copy of the report to [place].

## 2014-12-09 ENCOUNTER — Other Ambulatory Visit: Payer: Self-pay | Admitting: Internal Medicine

## 2014-12-22 ENCOUNTER — Telehealth: Payer: Self-pay | Admitting: Cardiology

## 2014-12-22 NOTE — Telephone Encounter (Signed)
°  1. Which medications need to be refilled? Crestor,Plavix,Metoprolol,Levothyroxine and Amlodipine  2. Which pharmacy is medication to be sent to?[facility]  3. Do they need a 30 day or 90 day supply? 90 and refills  4. Would they like a call back once the medication has been sent to the pharmacy? no

## 2014-12-23 ENCOUNTER — Other Ambulatory Visit: Payer: Self-pay

## 2014-12-23 MED ORDER — CLOPIDOGREL BISULFATE 75 MG PO TABS: 75.0000 mg | ORAL_TABLET | Freq: Every evening | ORAL | Status: DC

## 2014-12-23 MED ORDER — ROSUVASTATIN CALCIUM 20 MG PO TABS: 20.0000 mg | ORAL_TABLET | Freq: Every evening | ORAL | Status: DC

## 2014-12-23 MED ORDER — METOPROLOL SUCCINATE ER 50 MG PO TB24: 50.0000 mg | ORAL_TABLET | Freq: Every day | ORAL | Status: DC

## 2014-12-28 ENCOUNTER — Inpatient Hospital Stay: Admission: RE | Admit: 2014-12-28 | Payer: Medicare Other | Source: Ambulatory Visit

## 2014-12-29 ENCOUNTER — Other Ambulatory Visit: Payer: Self-pay

## 2014-12-29 NOTE — Telephone Encounter (Signed)
All medications have refills except Thyroid. She should get that from her PCP.

## 2015-01-18 ENCOUNTER — Telehealth: Payer: Self-pay

## 2015-01-18 NOTE — Telephone Encounter (Signed)
Called to notify patient of being due for a Mammogram. Patient would like to schedule an appointment with [facility]. I provided them with the contact information, and patient stated that she would call and set up an appointment.

## 2015-03-23 ENCOUNTER — Telehealth: Payer: Self-pay

## 2015-03-23 NOTE — Telephone Encounter (Signed)
Pt is off pain med but continues to have lower back pain. A company (pt is not sure of name of co.) is going to fax request for back brace. Pt does not have transportation and pt can not schedule appt; pt is not sure when her car will be fixed. Last f/u appt on 08/18/13 and no future appt scheduled. Pt request back brace form to be filled out without appt. Pt request cb.

## 2015-03-23 NOTE — Telephone Encounter (Signed)
I do not order back braces. This should come from PT or ortho. I can refer her if she would like.

## 2015-03-25 NOTE — Telephone Encounter (Signed)
Because, this is something a specialist does. I can refer her to an orthopedist if she would like.

## 2015-03-25 NOTE — Telephone Encounter (Signed)
Pt is aware as instructed---however pt states that she cannot even make it to her current dr appt will not be able to go somewhere else and pt wants to know why you cannot prescribe a back brace as she is not taking pain meds and believes this would help her--please advise

## 2015-04-04 ENCOUNTER — Other Ambulatory Visit: Payer: Self-pay | Admitting: Cardiology

## 2015-04-04 MED ORDER — LEVOTHYROXINE SODIUM 25 MCG PO TABS: 25.0000 ug | ORAL_TABLET | Freq: Every evening | ORAL | Status: DC

## 2015-04-27 DIAGNOSIS — Z23 Encounter for immunization: Secondary | ICD-10-CM | POA: Diagnosis not present

## 2015-06-28 ENCOUNTER — Other Ambulatory Visit: Payer: Self-pay | Admitting: *Deleted

## 2015-06-28 MED ORDER — AMLODIPINE BESYLATE 5 MG PO TABS: 5.0000 mg | ORAL_TABLET | Freq: Every evening | ORAL | Status: DC

## 2015-10-29 ENCOUNTER — Other Ambulatory Visit: Payer: Self-pay | Admitting: Nurse Practitioner

## 2015-10-29 MED ORDER — NITROGLYCERIN 0.4 MG SL SUBL: 0.4000 mg | SUBLINGUAL_TABLET | SUBLINGUAL | Status: DC | PRN

## 2015-10-31 ENCOUNTER — Other Ambulatory Visit: Payer: Self-pay | Admitting: Cardiology

## 2015-10-31 MED ORDER — NITROGLYCERIN 0.4 MG SL SUBL: 0.4000 mg | SUBLINGUAL_TABLET | SUBLINGUAL | Status: DC | PRN

## 2015-10-31 NOTE — Telephone Encounter (Signed)
Rx request sent to pharmacy.  

## 2016-04-10 ENCOUNTER — Encounter: Payer: Self-pay | Admitting: Cardiology
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
## 2016-04-11 ENCOUNTER — Ambulatory Visit (INDEPENDENT_AMBULATORY_CARE_PROVIDER_SITE_OTHER): Payer: Medicare Other | Admitting: Cardiology

## 2016-04-11 ENCOUNTER — Encounter: Payer: Self-pay | Admitting: Cardiology

## 2016-04-11 VITALS — BP 120/100 | HR 89 | Ht 60.0 in | Wt 166.8 lb

## 2016-04-11 DIAGNOSIS — E7800 Pure hypercholesterolemia, unspecified: Secondary | ICD-10-CM | POA: Diagnosis not present

## 2016-04-11 DIAGNOSIS — I10 Essential (primary) hypertension: Secondary | ICD-10-CM | POA: Diagnosis not present

## 2016-04-11 DIAGNOSIS — I2510 Atherosclerotic heart disease of native coronary artery without angina pectoris: Secondary | ICD-10-CM

## 2016-04-11 DIAGNOSIS — Z23 Encounter for immunization: Secondary | ICD-10-CM

## 2016-04-11 DIAGNOSIS — E785 Hyperlipidemia, unspecified: Secondary | ICD-10-CM

## 2016-04-11 DIAGNOSIS — E039 Hypothyroidism, unspecified: Secondary | ICD-10-CM

## 2016-04-11 DIAGNOSIS — F17200 Nicotine dependence, unspecified, uncomplicated: Secondary | ICD-10-CM

## 2016-04-11 MED ORDER — CLOPIDOGREL BISULFATE 75 MG PO TABS: 75.0000 mg | ORAL_TABLET | Freq: Every day | ORAL | 3 refills | Status: DC

## 2016-04-11 MED ORDER — LEVOTHYROXINE SODIUM 25 MCG PO TABS: 25.0000 ug | ORAL_TABLET | Freq: Every day | ORAL | 3 refills | Status: DC

## 2016-04-11 MED ORDER — ROSUVASTATIN CALCIUM 20 MG PO TABS: 20.0000 mg | ORAL_TABLET | Freq: Every day | ORAL | 3 refills | Status: DC

## 2016-04-11 MED ORDER — METOPROLOL SUCCINATE ER 50 MG PO TB24
50.0000 mg | ORAL_TABLET | Freq: Every day | ORAL | 3 refills | Status: DC
Start: 2016-04-11 — End: 2016-09-26

## 2016-04-11 MED ORDER — AMLODIPINE BESYLATE 5 MG PO TABS: 5.0000 mg | ORAL_TABLET | Freq: Every day | ORAL | 3 refills | Status: DC

## 2016-04-11 NOTE — Patient Instructions (Signed)
We will resume your prior medication.   Follow up in a couple of months with office visit and lab work  You need to quit smoking.

## 2016-04-11 NOTE — Addendum Note (Signed)
Addended by: Kathyrn Lass on: 04/11/2016 10:24 AM   Modules accepted: Orders

## 2016-05-15 ENCOUNTER — Telehealth: Payer: Self-pay | Admitting: Internal Medicine

## 2016-05-15 NOTE — Telephone Encounter (Signed)
LVM for pt to call back and schedule AWV/OV 30 with PCP. °

## 2016-06-05 DIAGNOSIS — E785 Hyperlipidemia, unspecified: Secondary | ICD-10-CM | POA: Diagnosis not present

## 2016-06-05 DIAGNOSIS — I10 Essential (primary) hypertension: Secondary | ICD-10-CM | POA: Diagnosis not present

## 2016-06-05 LAB — CBC WITH DIFFERENTIAL/PLATELET
BASOS ABS: 0 {cells}/uL (ref 0–200)
Basophils Relative: 0 %
EOS ABS: 184 {cells}/uL (ref 15–500)
Eosinophils Relative: 2 %
HEMATOCRIT: 45.3 % — AB (ref 35.0–45.0)
HEMOGLOBIN: 15.4 g/dL (ref 11.7–15.5)
LYMPHS ABS: 3128 {cells}/uL (ref 850–3900)
Lymphocytes Relative: 34 %
MCH: 32 pg (ref 27.0–33.0)
MCHC: 34 g/dL (ref 32.0–36.0)
MCV: 94 fL (ref 80.0–100.0)
MONO ABS: 460 {cells}/uL (ref 200–950)
MPV: 9.3 fL (ref 7.5–12.5)
Monocytes Relative: 5 %
NEUTROS ABS: 5428 {cells}/uL (ref 1500–7800)
Neutrophils Relative %: 59 %
Platelets: 300 10*3/uL (ref 140–400)
RBC: 4.82 MIL/uL (ref 3.80–5.10)
RDW: 13.2 % (ref 11.0–15.0)
WBC: 9.2 10*3/uL (ref 3.8–10.8)

## 2016-06-06 LAB — LIPID PANEL
Cholesterol: 145 mg/dL (ref <200)
HDL: 33 mg/dL — ABNORMAL LOW (ref >50)
LDL CALC: 48 mg/dL (ref <100)
TRIGLYCERIDES: 322 mg/dL — AB (ref <150)
Total CHOL/HDL Ratio: 4.4 Ratio (ref <5.0)
VLDL: 64 mg/dL — ABNORMAL HIGH (ref <30)

## 2016-06-06 LAB — BASIC METABOLIC PANEL
BUN: 14 mg/dL (ref 7–25)
CHLORIDE: 104 mmol/L (ref 98–110)
CO2: 26 mmol/L (ref 20–31)
CREATININE: 0.72 mg/dL (ref 0.50–1.05)
Calcium: 10.2 mg/dL (ref 8.6–10.4)
Glucose, Bld: 102 mg/dL — ABNORMAL HIGH (ref 65–99)
Potassium: 5.3 mmol/L (ref 3.5–5.3)
Sodium: 138 mmol/L (ref 135–146)

## 2016-06-06 LAB — HEPATIC FUNCTION PANEL
ALK PHOS: 67 U/L (ref 33–130)
ALT: 16 U/L (ref 6–29)
AST: 14 U/L (ref 10–35)
Albumin: 4.7 g/dL (ref 3.6–5.1)
BILIRUBIN DIRECT: 0.1 mg/dL (ref <=0.2)
BILIRUBIN INDIRECT: 0.2 mg/dL (ref 0.2–1.2)
BILIRUBIN TOTAL: 0.3 mg/dL (ref 0.2–1.2)
TOTAL PROTEIN: 7 g/dL (ref 6.1–8.1)

## 2016-06-06 LAB — TSH: TSH: 5.5 mIU/L — ABNORMAL HIGH

## 2016-06-11 ENCOUNTER — Ambulatory Visit (INDEPENDENT_AMBULATORY_CARE_PROVIDER_SITE_OTHER): Payer: Medicare Other | Admitting: Physician Assistant

## 2016-06-11 ENCOUNTER — Encounter: Payer: Self-pay | Admitting: Physician Assistant

## 2016-06-11 VITALS — BP 110/70 | HR 84 | Ht 60.0 in | Wt 169.8 lb

## 2016-06-11 DIAGNOSIS — Z720 Tobacco use: Secondary | ICD-10-CM

## 2016-06-11 DIAGNOSIS — I10 Essential (primary) hypertension: Secondary | ICD-10-CM

## 2016-06-11 DIAGNOSIS — I25708 Atherosclerosis of coronary artery bypass graft(s), unspecified, with other forms of angina pectoris: Secondary | ICD-10-CM | POA: Diagnosis not present

## 2016-06-11 DIAGNOSIS — J449 Chronic obstructive pulmonary disease, unspecified: Secondary | ICD-10-CM | POA: Diagnosis not present

## 2016-06-11 DIAGNOSIS — E781 Pure hyperglyceridemia: Secondary | ICD-10-CM

## 2016-06-11 DIAGNOSIS — E039 Hypothyroidism, unspecified: Secondary | ICD-10-CM | POA: Diagnosis not present

## 2016-06-11 DIAGNOSIS — I209 Angina pectoris, unspecified: Secondary | ICD-10-CM

## 2016-06-11 MED ORDER — OMEGA-3-ACID ETHYL ESTERS 1 G PO CAPS: 1.0000 g | ORAL_CAPSULE | Freq: Two times a day (BID) | ORAL | 11 refills | Status: DC
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
## 2016-06-11 NOTE — Patient Instructions (Signed)
Almyra Deforest, PA-C has recommended making the following medication changes: 1. START Lovaza - take 1 capsule twice daily  Your physician recommends that you schedule a follow-up appointment in 3 months with Dr Martinique.  If you need a refill on your cardiac medications before your next appointment, please call your pharmacy.   Nitroglycerin sublingual tablets What is this medicine? NITROGLYCERIN (nye troe GLI ser in) is a type of vasodilator. It relaxes blood vessels, increasing the blood and oxygen supply to your heart. This medicine is used to relieve chest pain caused by angina. It is also used to prevent chest pain before activities like climbing stairs, going outdoors in cold weather, or sexual activity. This medicine may be used for other purposes; ask your health care provider or pharmacist if you have questions. COMMON BRAND NAME(S): Nitroquick, Nitrostat, Nitrotab What should I tell my health care provider before I take this medicine? They need to know if you have any of these conditions: -anemia -head injury, recent stroke, or bleeding in the brain -liver disease -previous heart attack -an unusual or allergic reaction to nitroglycerin, other medicines, foods, dyes, or preservatives -pregnant or trying to get pregnant -breast-feeding How should I use this medicine? Take this medicine by mouth as needed. At the first sign of an angina attack (chest pain or tightness) place one tablet under your tongue. You can also take this medicine 5 to 10 minutes before an event likely to produce chest pain. Follow the directions on the prescription label. Let the tablet dissolve under the tongue. Do not swallow whole. Replace the dose if you accidentally swallow it. It will help if your mouth is not dry. Saliva around the tablet will help it to dissolve more quickly. Do not eat or drink, smoke or chew tobacco while a tablet is dissolving. If you are not better within 5 minutes after taking ONE dose of  nitroglycerin, call 9-1-1 immediately to seek emergency medical care. Do not take more than 3 nitroglycerin tablets over 15 minutes. If you take this medicine often to relieve symptoms of angina, your doctor or health care professional may provide you with different instructions to manage your symptoms. If symptoms do not go away after following these instructions, it is important to call 9-1-1 immediately. Do not take more than 3 nitroglycerin tablets over 15 minutes. Talk to your pediatrician regarding the use of this medicine in children. Special care may be needed. Overdosage: If you think you have taken too much of this medicine contact a poison control center or emergency room at once. NOTE: This medicine is only for you. Do not share this medicine with others. What if I miss a dose? This does not apply. This medicine is only used as needed. What may interact with this medicine? Do not take this medicine with any of the following medications: -certain migraine medicines like ergotamine and dihydroergotamine (DHE) -medicines used to treat erectile dysfunction like sildenafil, tadalafil, and vardenafil -riociguat This medicine may also interact with the following medications: -alteplase -aspirin -heparin -medicines for high blood pressure -medicines for mental depression -other medicines used to treat angina -phenothiazines like chlorpromazine, mesoridazine, prochlorperazine, thioridazine This list may not describe all possible interactions. Give your health care provider a list of all the medicines, herbs, non-prescription drugs, or dietary supplements you use. Also tell them if you smoke, drink alcohol, or use illegal drugs. Some items may interact with your medicine. What should I watch for while using this medicine? Tell your doctor or health  care professional if you feel your medicine is no longer working. Keep this medicine with you at all times. Sit or lie down when you take your  medicine to prevent falling if you feel dizzy or faint after using it. Try to remain calm. This will help you to feel better faster. If you feel dizzy, take several deep breaths and lie down with your feet propped up, or bend forward with your head resting between your knees. You may get drowsy or dizzy. Do not drive, use machinery, or do anything that needs mental alertness until you know how this drug affects you. Do not stand or sit up quickly, especially if you are an older patient. This reduces the risk of dizzy or fainting spells. Alcohol can make you more drowsy and dizzy. Avoid alcoholic drinks. Do not treat yourself for coughs, colds, or pain while you are taking this medicine without asking your doctor or health care professional for advice. Some ingredients may increase your blood pressure. What side effects may I notice from receiving this medicine? Side effects that you should report to your doctor or health care professional as soon as possible: -blurred vision -dry mouth -skin rash -sweating -the feeling of extreme pressure in the head -unusually weak or tired Side effects that usually do not require medical attention (report to your doctor or health care professional if they continue or are bothersome): -flushing of the face or neck -headache -irregular heartbeat, palpitations -nausea, vomiting This list may not describe all possible side effects. Call your doctor for medical advice about side effects. You may report side effects to FDA at 1-800-FDA-1088. Where should I keep my medicine? Keep out of the reach of children. Store at room temperature between 20 and 25 degrees C (68 and 77 degrees F). Store in [facility] [employer]. Protect from light and moisture. Keep tightly closed. Throw away any unused medicine after the expiration date. NOTE: This sheet is a summary. It may not cover all possible information. If you have questions about this medicine, talk to your doctor,  pharmacist, or health care provider.  2017 Elsevier/Gold Standard (2013-04-09 17:57:36)

## 2016-08-30 ENCOUNTER — Encounter: Payer: Self-pay | Admitting: Cardiology
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
## 2016-09-06 ENCOUNTER — Ambulatory Visit: Payer: Medicare Other | Admitting: Cardiology

## 2016-09-25 NOTE — Progress Notes (Signed)
[name] Date of Birth: February 06, 1956 Medical Record #[id number]  History of Present Illness: [name] is seen today for follow up CAD. She had remote coronary bypass surgery in 2002. She had prior stenting of the vein graft to the obtuse marginal vessel in 2009. She then had a drug-eluting stent placement to this same saphenous vein graft to the ramus in March of 2012. This subsequently occluded and she underwent angioplasty and drug-eluting stent to the ramus intermediate branch in September of 2012. Cardiac catheterization again in October of 2012 showed nonobstructive disease. Her last evaluation with [name] in July 2015 showed no ischemia and normal EF.   She also has a history of inflammatory bowel disease and COPD. She states she is still smoking but has cut down to 1/2 pk/day. She has a car now and has been able to procure her medication. She states she can tell that she feels much better on her medication. Her leg pain has resolved. Energy level is better. She still has a dull, constant pain in her mid sternum. Worse when lying down. Really unchanged from before.    Current Outpatient Prescriptions on File Prior to Visit  Medication Sig Dispense Refill  . clopidogrel (PLAVIX) 75 MG tablet Take 1 tablet (75 mg total) by mouth daily. 90 tablet 3  . diphenhydramine-acetaminophen (TYLENOL PM) 25-500 MG TABS Take 1 tablet by mouth at bedtime as needed (pain, sleep).    Marland Kitchen levothyroxine (SYNTHROID, LEVOTHROID) 25 MCG tablet Take 1 tablet (25 mcg total) by mouth daily before breakfast. 90 tablet 3  . omega-3 acid ethyl esters (LOVAZA) 1 g capsule Take 1 capsule (1 g total) by mouth 2 (two) times daily. 60 capsule 11  . amLODipine (NORVASC) 5 MG tablet Take 1 tablet (5 mg total) by mouth daily. 90 tablet 3   Current Facility-Administered Medications on File Prior to Visit  Medication Dose Route Frequency Provider Last Rate Last Dose  . bupivacaine (MARCAINE) 0.5 % 15 mL, phenazopyridine  (PYRIDIUM) 400 mg bladder mixture   Bladder Instillation Once Bjorn Loser, MD        Allergies  Allergen Reactions  . Ranitidine Hives  . Cimetidine Hives  . Sulfa Antibiotics Other (See Comments)    Childhood reaction    Past Medical History:  Diagnosis Date  . Anemia   . Anxiety   . Blood transfusion   . Chest pain, cardiac    Has chronic chest pain.   . Complication of anesthesia    "my blood pressure will drop out on me"  . Coronary artery disease    a) s/p CABG x 2 in [place] 2002. b)stenting to the SVG-OM in 2009. c)DES to the saphenous vein graft to the ramus in March 2012. d) s/p PTCA/DES to native ramus intermedius in September 2012. Last cath 03/2011 with minimal nonobstructive disease  e) indeterminant myoview 09/2011 but atypical CP & no perfusion defect in ramus intermediate distribution, EF 52%  . Depression   . 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 Enterocolitis   . Gastroparesis   . GERD (gastroesophageal reflux disease)   . GI bleeding   . Hematuria 09/25/11   "lately"  . Hx: UTI (urinary tract infection)    multiple  . Hyperlipidemia   . Hypertension   . Hypothyroidism   . Migraines    h/o  . PTSD (post-traumatic stress disorder)   . Tobacco abuse     Past Surgical History:  Procedure Laterality Date  . ABDOMINAL HYSTERECTOMY  1980's  . BIOPSY STOMACH  09/25/11   "several in the last year; all negative; via endoscopy"  . BREAST SURGERY     right biopsy  . CHOLECYSTECTOMY  2000's  . CORONARY ANGIOPLASTY WITH STENT PLACEMENT     "lots; they've all collapsed; last time dr took native vein &  turned it around & put stents in it before attaching"  . CORONARY ARTERY BYPASS GRAFT  2002   CABG X2  . CYSTO WITH HYDRODISTENSION  03/28/2012   Procedure: CYSTOSCOPY/HYDRODISTENSION;  Surgeon: Reece Packer, MD;  Location: [facility];  Service: Urology;  Laterality: N/A;  . CYSTOSCOPY W/ RETROGRADES  03/28/2012   Procedure: CYSTOSCOPY  WITH RETROGRADE PYELOGRAM;  Surgeon: Reece Packer, MD;  Location: [facility];  Service: Urology;  Laterality: Bilateral;  . CYSTOSCOPY WITH INJECTION  03/28/2012   Procedure: CYSTOSCOPY WITH INJECTION;  Surgeon: Reece Packer, MD;  Location: [facility];  Service: Urology;  Laterality: N/A;  Marcaine and Pyridium  . PILONIDAL CYST / SINUS EXCISION  1980's   "was wrapped around my spinal cord; Dr. Norville Haggard got almost all of it except a tiny bit; said it was really deep; packed fatty tissue in the hole; really bothers me alot"  . SALPINGOOPHORECTOMY  1980's   "2 surgeries; 1st right; then left"  . TMJ ARTHROPLASTY  1980's   right side; "I've had to have it twice"  . VESICOVAGINAL FISTULA CLOSURE W/ TAH  1983    History  Smoking Status  . Former Smoker  . Packs/day: 0.25  . Years: 15.00  . Types: Cigarettes  Smokeless Tobacco  . Never Used    History  Alcohol Use No    Family History  Problem Relation Age of Onset  . Emphysema Mother     smoked  . Asthma Mother   . Breast cancer Mother   . Heart disease Father   . Heart disease Paternal Grandfather     Review of Systems: The review of systems is  positive for chest pain as noted. She also has a history of depression.  All other systems were reviewed and are negative.  Physical Exam: BP 100/74   Pulse 86   Ht 5' (1.524 m)   Wt 169 lb (76.7 kg)   BMI 33.01 kg/m  Patient is alert and in no acute distress. Skin is warm and dry. Color is normal.  HEENT is unremarkable. Normocephalic/atraumatic. PERRL. Sclera are nonicteric. Neck is supple. No masses. No JVD. Lungs are clear. Cardiac exam shows a regular rate and rhythm. Normal S1-2. No gallop or murmur. No chest wall tenderness to palpation. Abdomen is soft. Extremities are without edema. Gait and ROM are intact. No gross neurologic deficits noted.  LABORATORY DATA:   Lab Results  Component Value Date   WBC 9.2 06/05/2016   HGB 15.4 06/05/2016   HCT 45.3 (H) 06/05/2016    PLT 300 06/05/2016   GLUCOSE 102 (H) 06/05/2016   CHOL 145 06/05/2016   TRIG 322 (H) 06/05/2016   HDL 33 (L) 06/05/2016   LDLCALC 48 06/05/2016   ALT 16 06/05/2016   AST 14 06/05/2016   NA 138 06/05/2016   K 5.3 06/05/2016   CL 104 06/05/2016   CREATININE 0.72 06/05/2016   BUN 14 06/05/2016   CO2 26 06/05/2016   TSH 5.50 (H) 06/05/2016   INR 1.07 09/25/2011   HGBA1C 5.7 (H) 03/08/2011   Ecg is not done today.  Assessment / Plan: 1. Coronary disease with previous bypass and multiple interventions as noted above. She has chronic atypical chest pain. Constant.  Normal myoview in July 2015. We discussed updating a stress test at this time but she would like to defer. Continue  Plavix, amlodipine, Toprol XL and Crestor.  2. Hypertension, well controlled on medication.  3. Tobacco abuse.  Encourage complete cessation.  4. Hyperlipidemia. LDL well controlled on Crestor. Elevated triglycerides. Needs to focus on smoking cessation, weight loss, and fish oil.   5. Inflammatory bowel disease.  6. COPD  7. Hypothyroidism. TSH 5.5. Will repeat next visit.

## 2016-09-26 ENCOUNTER — Ambulatory Visit (INDEPENDENT_AMBULATORY_CARE_PROVIDER_SITE_OTHER): Payer: Medicare Other | Admitting: Cardiology

## 2016-09-26 ENCOUNTER — Encounter: Payer: Self-pay | Admitting: Cardiology

## 2016-09-26 VITALS — BP 100/74 | HR 86 | Ht 60.0 in | Wt 169.0 lb

## 2016-09-26 DIAGNOSIS — J449 Chronic obstructive pulmonary disease, unspecified: Secondary | ICD-10-CM | POA: Diagnosis not present

## 2016-09-26 DIAGNOSIS — I25708 Atherosclerosis of coronary artery bypass graft(s), unspecified, with other forms of angina pectoris: Secondary | ICD-10-CM | POA: Diagnosis not present

## 2016-09-26 DIAGNOSIS — I209 Angina pectoris, unspecified: Secondary | ICD-10-CM

## 2016-09-26 DIAGNOSIS — I10 Essential (primary) hypertension: Secondary | ICD-10-CM | POA: Diagnosis not present

## 2016-09-26 DIAGNOSIS — Z720 Tobacco use: Secondary | ICD-10-CM | POA: Diagnosis not present

## 2016-09-26 NOTE — Patient Instructions (Addendum)
Medication Instructions: Your physician recommends that you continue on your current medications as directed. Please refer to the Current Medication list given to you today.   Labwork: Your physician recommends that you return for lab work in: 6 months - CMET, LIPID, TSH  Testing/Procedures: None   Follow-Up: Your physician wants you to follow-up in 6 months with Dr. Martinique. You will receive a reminder letter in the mail two months in advance. If you don't receive a letter, please call our office to schedule the follow-up appointment.   Any Other Special Instructions Will Be Listed Below (If Applicable).     If you need a refill on your cardiac medications before your next appointment, please call your pharmacy.

## 2016-11-05 ENCOUNTER — Encounter: Payer: Self-pay | Admitting: Internal Medicine

## 2016-11-05 ENCOUNTER — Ambulatory Visit (INDEPENDENT_AMBULATORY_CARE_PROVIDER_SITE_OTHER): Payer: Medicare Other | Admitting: Internal Medicine

## 2016-11-05 VITALS — BP 114/66 | HR 96 | Temp 98.6°F | Ht 59.5 in | Wt 168.0 lb

## 2016-11-05 DIAGNOSIS — N3000 Acute cystitis without hematuria: Secondary | ICD-10-CM

## 2016-11-05 DIAGNOSIS — Z114 Encounter for screening for human immunodeficiency virus [HIV]: Secondary | ICD-10-CM | POA: Diagnosis not present

## 2016-11-05 DIAGNOSIS — E7800 Pure hypercholesterolemia, unspecified: Secondary | ICD-10-CM

## 2016-11-05 DIAGNOSIS — R7309 Other abnormal glucose: Secondary | ICD-10-CM

## 2016-11-05 DIAGNOSIS — R350 Frequency of micturition: Secondary | ICD-10-CM | POA: Diagnosis not present

## 2016-11-05 DIAGNOSIS — K580 Irritable bowel syndrome with diarrhea: Secondary | ICD-10-CM

## 2016-11-05 DIAGNOSIS — I209 Angina pectoris, unspecified: Secondary | ICD-10-CM | POA: Diagnosis not present

## 2016-11-05 DIAGNOSIS — F419 Anxiety disorder, unspecified: Secondary | ICD-10-CM

## 2016-11-05 DIAGNOSIS — K219 Gastro-esophageal reflux disease without esophagitis: Secondary | ICD-10-CM

## 2016-11-05 DIAGNOSIS — I25708 Atherosclerosis of coronary artery bypass graft(s), unspecified, with other forms of angina pectoris: Secondary | ICD-10-CM

## 2016-11-05 DIAGNOSIS — I10 Essential (primary) hypertension: Secondary | ICD-10-CM

## 2016-11-05 DIAGNOSIS — Z0000 Encounter for general adult medical examination without abnormal findings: Secondary | ICD-10-CM

## 2016-11-05 DIAGNOSIS — Z1159 Encounter for screening for other viral diseases: Secondary | ICD-10-CM

## 2016-11-05 DIAGNOSIS — Z862 Personal history of diseases of the blood and blood-forming organs and certain disorders involving the immune mechanism: Secondary | ICD-10-CM | POA: Diagnosis not present

## 2016-11-05 DIAGNOSIS — R351 Nocturia: Secondary | ICD-10-CM

## 2016-11-05 DIAGNOSIS — E039 Hypothyroidism, unspecified: Secondary | ICD-10-CM | POA: Diagnosis not present

## 2016-11-05 DIAGNOSIS — F32A Depression, unspecified: Secondary | ICD-10-CM

## 2016-11-05 DIAGNOSIS — F329 Major depressive disorder, single episode, unspecified: Secondary | ICD-10-CM

## 2016-11-05 LAB — COMPREHENSIVE METABOLIC PANEL
ALT: 18 U/L (ref 0–35)
AST: 15 U/L (ref 0–37)
Albumin: 4.6 g/dL (ref 3.5–5.2)
Alkaline Phosphatase: 59 U/L (ref 39–117)
BUN: 14 mg/dL (ref 6–23)
CHLORIDE: 107 meq/L (ref 96–112)
CO2: 26 mEq/L (ref 19–32)
Calcium: 9.6 mg/dL (ref 8.4–10.5)
Creatinine, Ser: 0.78 mg/dL (ref 0.40–1.20)
GFR: 80.37 mL/min (ref >60.00)
GLUCOSE: 105 mg/dL — AB (ref 70–99)
POTASSIUM: 4.5 meq/L (ref 3.5–5.1)
Sodium: 138 mEq/L (ref 135–145)
TOTAL PROTEIN: 7.2 g/dL (ref 6.0–8.3)
Total Bilirubin: 0.3 mg/dL (ref 0.2–1.2)

## 2016-11-05 LAB — HEMOGLOBIN A1C: Hgb A1c MFr Bld: 5.8 % (ref 4.6–6.5)

## 2016-11-05 LAB — T4, FREE: Free T4: 0.64 ng/dL (ref 0.60–1.60)

## 2016-11-05 LAB — CBC
HEMATOCRIT: 45.2 % (ref 36.0–46.0)
Hemoglobin: 15.5 g/dL — ABNORMAL HIGH (ref 12.0–15.0)
MCHC: 34.2 g/dL (ref 30.0–36.0)
MCV: 94.3 fl (ref 78.0–100.0)
Platelets: 279 10*3/uL (ref 150.0–400.0)
RBC: 4.79 Mil/uL (ref 3.87–5.11)
RDW: 13.4 % (ref 11.5–15.5)
WBC: 10.8 10*3/uL — AB (ref 4.0–10.5)

## 2016-11-05 LAB — POC URINALSYSI DIPSTICK (AUTOMATED)
BILIRUBIN UA: NEGATIVE
GLUCOSE UA: NEGATIVE
Ketones, UA: NEGATIVE
Leukocytes, UA: NEGATIVE
NITRITE UA: POSITIVE
Protein, UA: NEGATIVE
Urobilinogen, UA: 0.2 E.U./dL
pH, UA: 5.5 (ref 5.0–8.0)

## 2016-11-05 LAB — LIPID PANEL
Cholesterol: 141 mg/dL (ref 0–200)
HDL: 33.3 mg/dL — ABNORMAL LOW (ref >39.00)
LDL CALC: 69 mg/dL (ref 0–99)
NonHDL: 108.1
TRIGLYCERIDES: 195 mg/dL — AB (ref 0.0–149.0)
Total CHOL/HDL Ratio: 4
VLDL: 39 mg/dL (ref 0.0–40.0)

## 2016-11-05 LAB — TSH: TSH: 7.54 u[IU]/mL — ABNORMAL HIGH (ref 0.35–4.50)

## 2016-11-05 MED ORDER — CIPROFLOXACIN HCL 500 MG PO TABS: 500.0000 mg | ORAL_TABLET | Freq: Two times a day (BID) | ORAL | 0 refills | Status: DC

## 2016-11-05 MED ORDER — OMEPRAZOLE 20 MG PO CPDR: 20.0000 mg | DELAYED_RELEASE_CAPSULE | Freq: Every day | ORAL | 11 refills | Status: DC

## 2016-11-05 NOTE — Patient Instructions (Signed)
Health Maintenance for Postmenopausal Women Menopause is a normal process in which your reproductive ability comes to an end. This process happens gradually over a span of months to years, usually between the ages of 33 and 38. Menopause is complete when you have missed 12 consecutive menstrual periods. It is important to talk with your health care provider about some of the most common conditions that affect postmenopausal women, such as heart disease, cancer, and bone loss (osteoporosis). Adopting a healthy lifestyle and getting preventive care can help to promote your health and wellness. Those actions can also lower your chances of developing some of these common conditions. What should I know about menopause? During menopause, you may experience a number of symptoms, such as:  Moderate-to-severe hot flashes.  Night sweats.  Decrease in sex drive.  Mood swings.  Headaches.  Tiredness.  Irritability.  Memory problems.  Insomnia. Choosing to treat or not to treat menopausal changes is an individual decision that you make with your health care provider. What should I know about hormone replacement therapy and supplements? Hormone therapy products are effective for treating symptoms that are associated with menopause, such as hot flashes and night sweats. Hormone replacement carries certain risks, especially as you become older. If you are thinking about using estrogen or estrogen with progestin treatments, discuss the benefits and risks with your health care provider. What should I know about heart disease and stroke? Heart disease, heart attack, and stroke become more likely as you age. This may be due, in part, to the hormonal changes that your body experiences during menopause. These can affect how your body processes dietary fats, triglycerides, and cholesterol. Heart attack and stroke are both medical emergencies. There are many things that you can do to help prevent heart disease  and stroke:  Have your blood pressure checked at least every 1-2 years. High blood pressure causes heart disease and increases the risk of stroke.  If you are 48-61 years old, ask your health care provider if you should take aspirin to prevent a heart attack or a stroke.  Do not use any tobacco products, including cigarettes, chewing tobacco, or electronic cigarettes. If you need help quitting, ask your health care provider.  It is important to eat a healthy diet and maintain a healthy weight.  Be sure to include plenty of vegetables, fruits, low-fat dairy products, and lean protein.  Avoid eating foods that are high in solid fats, added sugars, or salt (sodium).  Get regular exercise. This is one of the most important things that you can do for your health.  Try to exercise for at least 150 minutes each week. The type of exercise that you do should increase your heart rate and make you sweat. This is known as moderate-intensity exercise.  Try to do strengthening exercises at least twice each week. Do these in addition to the moderate-intensity exercise.  Know your numbers.Ask your health care provider to check your cholesterol and your blood glucose. Continue to have your blood tested as directed by your health care provider. What should I know about cancer screening? There are several types of cancer. Take the following steps to reduce your risk and to catch any cancer development as early as possible. Breast Cancer  Practice breast self-awareness.  This means understanding how your breasts normally appear and feel.  It also means doing regular breast self-exams. Let your health care provider know about any changes, no matter how small.  If you are 40 or older,  have a clinician do a breast exam (clinical breast exam or CBE) every year. Depending on your age, family history, and medical history, it may be recommended that you also have a yearly breast X-ray (mammogram).  If you  have a family history of breast cancer, talk with your health care provider about genetic screening.  If you are at high risk for breast cancer, talk with your health care provider about having an MRI and a mammogram every year.  Breast cancer (BRCA) gene test is recommended for women who have family members with BRCA-related cancers. Results of the assessment will determine the need for genetic counseling and BRCA1 and for BRCA2 testing. BRCA-related cancers include these types:  Breast. This occurs in males or females.  Ovarian.  Tubal. This may also be called fallopian tube cancer.  Cancer of the abdominal or pelvic lining (peritoneal cancer).  Prostate.  Pancreatic. Cervical, Uterine, and Ovarian Cancer  Your health care provider may recommend that you be screened regularly for cancer of the pelvic organs. These include your ovaries, uterus, and vagina. This screening involves a pelvic exam, which includes checking for microscopic changes to the surface of your cervix (Pap test).  For women ages 21-65, health care providers may recommend a pelvic exam and a Pap test every three years. For women ages 23-65, they may recommend the Pap test and pelvic exam, combined with testing for human papilloma virus (HPV), every five years. Some types of HPV increase your risk of cervical cancer. Testing for HPV may also be done on women of any age who have unclear Pap test results.  Other health care providers may not recommend any screening for nonpregnant women who are considered low risk for pelvic cancer and have no symptoms. Ask your health care provider if a screening pelvic exam is right for you.  If you have had past treatment for cervical cancer or a condition that could lead to cancer, you need Pap tests and screening for cancer for at least 20 years after your treatment. If Pap tests have been discontinued for you, your risk factors (such as having a new sexual partner) need to be reassessed  to determine if you should start having screenings again. Some women have medical problems that increase the chance of getting cervical cancer. In these cases, your health care provider may recommend that you have screening and Pap tests more often.  If you have a family history of uterine cancer or ovarian cancer, talk with your health care provider about genetic screening.  If you have vaginal bleeding after reaching menopause, tell your health care provider.  There are currently no reliable tests available to screen for ovarian cancer. Lung Cancer  Lung cancer screening is recommended for adults 99-83 years old who are at high risk for lung cancer because of a history of smoking. A yearly low-dose CT scan of the lungs is recommended if you:  Currently smoke.  Have a history of at least 30 pack-years of smoking and you currently smoke or have quit within the past 15 years. A pack-year is smoking an average of one pack of cigarettes per day for one year. Yearly screening should:  Continue until it has been 15 years since you quit.  Stop if you develop a health problem that would prevent you from having lung cancer treatment. Colorectal Cancer  This type of cancer can be detected and can often be prevented.  Routine colorectal cancer screening usually begins at age 72 and continues  through age 75.  If you have risk factors for colon cancer, your health care provider may recommend that you be screened at an earlier age.  If you have a family history of colorectal cancer, talk with your health care provider about genetic screening.  Your health care provider may also recommend using home test kits to check for hidden blood in your stool.  A small camera at the end of a tube can be used to examine your colon directly (sigmoidoscopy or colonoscopy). This is done to check for the earliest forms of colorectal cancer.  Direct examination of the colon should be repeated every 5-10 years until  age 75. However, if early forms of precancerous polyps or small growths are found or if you have a family history or genetic risk for colorectal cancer, you may need to be screened more often. Skin Cancer  Check your skin from head to toe regularly.  Monitor any moles. Be sure to tell your health care provider:  About any new moles or changes in moles, especially if there is a change in a mole's shape or color.  If you have a mole that is larger than the size of a pencil eraser.  If any of your family members has a history of skin cancer, especially at a young age, talk with your health care provider about genetic screening.  Always use sunscreen. Apply sunscreen liberally and repeatedly throughout the day.  Whenever you are outside, protect yourself by wearing long sleeves, pants, a wide-brimmed hat, and sunglasses. What should I know about osteoporosis? Osteoporosis is a condition in which bone destruction happens more quickly than new bone creation. After menopause, you may be at an increased risk for osteoporosis. To help prevent osteoporosis or the bone fractures that can happen because of osteoporosis, the following is recommended:  If you are 19-50 years old, get at least 1,000 mg of calcium and at least 600 mg of vitamin D per day.  If you are older than age 50 but younger than age 70, get at least 1,200 mg of calcium and at least 600 mg of vitamin D per day.  If you are older than age 70, get at least 1,200 mg of calcium and at least 800 mg of vitamin D per day. Smoking and excessive alcohol intake increase the risk of osteoporosis. Eat foods that are rich in calcium and vitamin D, and do weight-bearing exercises several times each week as directed by your health care provider. What should I know about how menopause affects my mental health? Depression may occur at any age, but it is more common as you become older. Common symptoms of depression include:  Low or sad  mood.  Changes in sleep patterns.  Changes in appetite or eating patterns.  Feeling an overall lack of motivation or enjoyment of activities that you previously enjoyed.  Frequent crying spells. Talk with your health care provider if you think that you are experiencing depression. What should I know about immunizations? It is important that you get and maintain your immunizations. These include:  Tetanus, diphtheria, and pertussis (Tdap) booster vaccine.  Influenza every year before the flu season begins.  Pneumonia vaccine.  Shingles vaccine. Your health care provider may also recommend other immunizations. This information is not intended to replace advice given to you by your health care provider. Make sure you discuss any questions you have with your health care provider. Document Released: 08/03/2005 Document Revised: 12/30/2015 Document Reviewed: 03/15/2015 Elsevier Interactive Patient   Education  2017 [employer]  

## 2016-11-05 NOTE — Progress Notes (Signed)
HPI:  Pt presents to the clinic today for her Medicare Wellness Exam. She is also due for follow up of chronic conditions.  History of Anemia: Her last H/H was 45.3/15.4. She has not noticed any s/s of bleeding.  Anxiety and Depression: Triggered by general life stress. She does have some PTSD. She is currently not taking anything for anxiety and depression, she just deals with. She denies SI/HI.  HLD with CAD: s/p CABG with stents. She does have angina. Her last LDL was 43, triglycerides 322, 05/2016. She is taking Plavix, Metoprolol, Crestor and Lovaza. ECG from 05/2016 reviewed. Echo from 12/2013 reviewed. She follows with Dr. Martinique.  GERD with Gastroparesis: She is not sure what is triggering this. She takes Tums daily with good relief.  IBS:  Mainly diarrhea. She was taking a fiber supplement but she reoprts it made it worse. She is not taking anything else for IBS at this time.  HTN: Her BP today is 114/66. She is taking Amlodipine and Metoprolol as prescribed. ECG from 05/2016 reviewed.  Hypothyroidism: Her levels were last checked 05/2016. She is taking Synthroid as prescribed.  Past Medical History:  Diagnosis Date  . Anemia   . Anxiety   . Blood transfusion   . Chest pain, cardiac    Has chronic chest pain.   . Complication of anesthesia    "my blood pressure will drop out on me"  . Coronary artery disease    a) s/p CABG x 2 in [place] 2002. b)stenting to the SVG-OM in 2009. c)DES to the saphenous vein graft to the ramus in March 2012. d) s/p PTCA/DES to native ramus intermedius in September 2012. Last cath 03/2011 with minimal nonobstructive disease  e) indeterminant myoview 09/2011 but atypical CP & no perfusion defect in ramus intermediate distribution, EF 52%  . Depression   . 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 Enterocolitis   . Gastroparesis   . GERD (gastroesophageal reflux disease)   . GI bleeding   . Hematuria 09/25/11   "lately"  . Hx:  UTI (urinary tract infection)    multiple  . Hyperlipidemia   . Hypertension   . Hypothyroidism   . Migraines    h/o  . PTSD (post-traumatic stress disorder)   . Tobacco abuse     Current Outpatient Prescriptions  Medication Sig Dispense Refill  . amLODipine (NORVASC) 5 MG tablet Take 1 tablet (5 mg total) by mouth daily. 90 tablet 3  . clopidogrel (PLAVIX) 75 MG tablet Take 1 tablet (75 mg total) by mouth daily. 90 tablet 3  . diphenhydramine-acetaminophen (TYLENOL PM) 25-500 MG TABS Take 1 tablet by mouth at bedtime as needed (pain, sleep).    Marland Kitchen levothyroxine (SYNTHROID, LEVOTHROID) 25 MCG tablet Take 1 tablet (25 mcg total) by mouth daily before breakfast. 90 tablet 3  . metoprolol succinate (TOPROL-XL) 50 MG 24 hr tablet Take 50 mg by mouth daily. Take with or immediately following a meal.    . omega-3 acid ethyl esters (LOVAZA) 1 g capsule Take 1 capsule (1 g total) by mouth 2 (two) times daily. 60 capsule 11  . rosuvastatin (CRESTOR) 20 MG tablet Take 20 mg by mouth daily.     No current facility-administered medications for this visit.    Facility-Administered Medications Ordered in Other Visits  Medication Dose Route Frequency Provider Last Rate Last Dose  . bupivacaine (MARCAINE) 0.5 % 15 mL, phenazopyridine (PYRIDIUM) 400 mg bladder mixture  Bladder Instillation Once Bjorn Loser, MD        Allergies  Allergen Reactions  . Ranitidine Hives  . Cimetidine Hives  . Sulfa Antibiotics Other (See Comments)    Childhood reaction    Family History  Problem Relation Age of Onset  . Emphysema Mother        smoked  . Asthma Mother   . Breast cancer Mother   . Heart disease Father   . Heart disease Paternal Grandfather     Social History   Social History  . Marital status: Widowed    Spouse name: N/A  . Number of children: 2  . Years of education: N/A   Occupational History  . Disabled    Social History Main Topics  . Smoking status: Former Smoker     Packs/day: 0.25    Years: 15.00    Types: Cigarettes  . Smokeless tobacco: Never Used  . Alcohol use No  . Drug use: Yes    Types: Marijuana     Comment: "as a teen"  . Sexual activity: Not Currently   Other Topics Concern  . Not on file   Social History Narrative  . No narrative on file    Hospitiliaztions: None  Health Maintenance:    Flu: 03/2016  Tetanus: 05/2012  Mammogram: > 5 years ago  Pap Smear: Hysterectomy, total  Colon Screening: about 4 years ago, was due to go back last year, but reports she can not afford it.  Eye Doctor: as needed  Dental Exam: as needed   Providers:   PCP: [name], NP-C  Cardiologist: Dr. Martinique   I have personally reviewed and have noted:  1. The patient's medical and social history 2. Their use of alcohol, tobacco or illicit drugs 3. Their current medications and supplements 4. The patient's functional ability including ADL's, fall risks, home safety risks and hearing or visual impairment. 5. Diet and physical activities 6. Evidence for depression or mood disorder  Subjective:   Review of Systems:   Constitutional: Denies fever, malaise, fatigue, headache or abrupt weight changes.  HEENT: Denies eye pain, eye redness, ear pain, ringing in the ears, wax buildup, runny nose, nasal congestion, bloody nose, or sore throat. Respiratory: Denies difficulty breathing, shortness of breath, cough or sputum production.   Cardiovascular: Denies chest pain, chest tightness, palpitations or swelling in the hands or feet.  Gastrointestinal: Pt reports reflux., loose stools. Denies abdominal pain, bloating, constipation, or blood in the stool.  GU: Pt reports urinary urgency and nocturia. Denies frequency, pain with urination, burning sensation, blood in urine, odor or discharge. Musculoskeletal: Denies decrease in range of motion, difficulty with gait, muscle pain or joint pain and swelling.  Skin: Denies redness, rashes, lesions or  ulcercations.  Neurological: Denies dizziness, difficulty with memory, difficulty with speech or problems with balance and coordination.  Psych: Pt has history of anxiety and depression. Denies SI/HI.  No other specific complaints in a complete review of systems (except as listed in HPI above).  Objective:  PE:   BP 114/66   Pulse 96   Temp 98.6 F (37 C) (Oral)   Ht 4' 11.5" (1.511 m)   Wt 168 lb (76.2 kg)   SpO2 96%   BMI 33.36 kg/m   Wt Readings from Last 3 Encounters:  09/26/16 169 lb (76.7 kg)  06/11/16 169 lb 12.8 oz (77 kg)  04/11/16 166 lb 12.8 oz (75.7 kg)    General: Appears her stated age, in  NAD. Cardiovascular: Normal rate and rhythm. S1,S2 noted.  No murmur, rubs or gallops noted. No JVD or BLE edema. No carotid bruits noted. Pulmonary/Chest: Normal effort and positive vesicular breath sounds. No respiratory distress. No wheezes, rales or ronchi noted.  Abdomen: Soft and nontender. Normal bowel sounds. No distention or masses noted. No CVA tenderness noted. Neurological: Alert and oriented.  Psychiatric: Mood and affect flat. Behavior is normal. Judgment and thought content normal.    BMET    Component Value Date/Time   NA 138 06/05/2016 0814   K 5.3 06/05/2016 0814   CL 104 06/05/2016 0814   CO2 26 06/05/2016 0814   GLUCOSE 102 (H) 06/05/2016 0814   BUN 14 06/05/2016 0814   CREATININE 0.72 06/05/2016 0814   CALCIUM 10.2 06/05/2016 0814   GFRNONAA >90 05/22/2014 2128   GFRAA >90 05/22/2014 2128    Lipid Panel     Component Value Date/Time   CHOL 145 06/05/2016 0814   TRIG 322 (H) 06/05/2016 0814   HDL 33 (L) 06/05/2016 0814   CHOLHDL 4.4 06/05/2016 0814   VLDL 64 (H) 06/05/2016 0814   LDLCALC 48 06/05/2016 0814    CBC    Component Value Date/Time   WBC 9.2 06/05/2016 0814   RBC 4.82 06/05/2016 0814   HGB 15.4 06/05/2016 0814   HCT 45.3 (H) 06/05/2016 0814   PLT 300 06/05/2016 0814   MCV 94.0 06/05/2016 0814   MCH 32.0 06/05/2016 0814    MCHC 34.0 06/05/2016 0814   RDW 13.2 06/05/2016 0814   LYMPHSABS 3,128 06/05/2016 0814   MONOABS 460 06/05/2016 0814   EOSABS 184 06/05/2016 0814   BASOSABS 0 06/05/2016 0814    Hgb A1C Lab Results  Component Value Date   HGBA1C 5.7 (H) 03/08/2011      Assessment and Plan:   Medicare Annual Wellness Visit:  Diet: She does eat meat. She consumes more veggies, no fruits. She does eat some fried foods. She drinks mostly water, coffee. Physical activity: Sedentary Depression/mood screen: Positive, refuses treatment. Hearing: Intact to whispered voice Visual acuity: Grossly normal ADLs: Capable Fall risk: None Home safety: Good Cognitive evaluation: Intact to orientation, naming, recall and repetition. EOL planning: No adv directives, full code/ I agree  Preventative Medicine: Flu and tetanus UTD. She declines mammogram, pap smear, or colon cancer screening. Encouraged her to consume a balanced diet and exercise regimen. Advised her to see an eye doctor and dentist annually. Will check CBC, CMET, Lipid, TSH, T4, A1C, HIV and Hep C today.  Urinary Frequency, Nocturia, secondary to UTI:  Urinalysis: + nitrites, 3+ blood Will send urine culture Push fluids eRx for Cipro 500 mg BID x 5 days  Next appointment: 1 year, sooner if needed BAITY, REGINA, NP    [name], NP

## 2016-11-06 DIAGNOSIS — Z862 Personal history of diseases of the blood and blood-forming organs and certain disorders involving the immune mechanism: Secondary | ICD-10-CM | POA: Insufficient documentation

## 2016-11-06 DIAGNOSIS — K589 Irritable bowel syndrome without diarrhea: Secondary | ICD-10-CM | POA: Insufficient documentation

## 2016-11-06 LAB — HIV ANTIBODY (ROUTINE TESTING W REFLEX): HIV 1&2 Ab, 4th Generation: NONREACTIVE

## 2016-11-06 LAB — HEPATITIS C ANTIBODY: HCV AB: NEGATIVE

## 2016-11-06 NOTE — Assessment & Plan Note (Signed)
Encouraged her to consume a low fat diet CMET and Lipid profile today Continue Crestor and Lovaza for now

## 2016-11-06 NOTE — Assessment & Plan Note (Signed)
Controlled on Amlodipine and Metoprolol CMET today

## 2016-11-06 NOTE — Assessment & Plan Note (Signed)
Will start PPI given daily symptoms eRx for Prilosec 20 mg daily She reports she was on Dexilant in the pasat

## 2016-11-06 NOTE — Assessment & Plan Note (Signed)
TSH and T4 today Will adjust Synthroid if needed based on labs

## 2016-11-06 NOTE — Assessment & Plan Note (Signed)
CBC today.  

## 2016-11-06 NOTE — Assessment & Plan Note (Signed)
Advised her to try a probiotic and/or Imodium OTC as needed Will monitor

## 2016-11-06 NOTE — Assessment & Plan Note (Signed)
Chronic She refuses treatment Support offered today Will monitor

## 2016-11-06 NOTE — Assessment & Plan Note (Signed)
Chronic chest pain CMET and Lipid profile today She will continue Plavix, Metoprolol, Crestor and Lovaza She will continue to follow with Dr. Martinique.

## 2016-11-08 LAB — URINE CULTURE

## 2016-11-08 MED ORDER — LEVOTHYROXINE SODIUM 50 MCG PO TABS: 50.0000 ug | ORAL_TABLET | Freq: Every day | ORAL | 1 refills | Status: DC

## 2016-11-08 NOTE — Addendum Note (Signed)
Addended by: Lurlean Nanny on: 11/08/2016 11:56 AM   Modules accepted: Orders

## 2016-11-30 ENCOUNTER — Telehealth: Payer: Self-pay

## 2016-11-30 NOTE — Telephone Encounter (Addendum)
pt had annual exam on 11/05/16 and pt was given omeprazole 20 mg taking one daily. Pt request a stronger med or Dexilant be given instead of Omeprazole; the omeprazole is not taking care of the indigestion. [facility] [employer]. Pt wants to wait on R Baity NP return next week for answer.

## 2016-12-03 MED ORDER — DEXLANSOPRAZOLE 30 MG PO CPDR: 30.0000 mg | DELAYED_RELEASE_CAPSULE | Freq: Every day | ORAL | 11 refills | Status: DC

## 2016-12-03 NOTE — Telephone Encounter (Signed)
Dexilent sent to pharmacy

## 2016-12-03 NOTE — Addendum Note (Signed)
Addended by: Jearld Fenton on: 12/03/2016 09:10 AM   Modules accepted: Orders

## 2016-12-07 ENCOUNTER — Other Ambulatory Visit (INDEPENDENT_AMBULATORY_CARE_PROVIDER_SITE_OTHER): Payer: Medicare Other

## 2016-12-07 DIAGNOSIS — E039 Hypothyroidism, unspecified: Secondary | ICD-10-CM

## 2016-12-07 DIAGNOSIS — Z1212 Encounter for screening for malignant neoplasm of rectum: Secondary | ICD-10-CM | POA: Diagnosis not present

## 2016-12-07 DIAGNOSIS — Z1211 Encounter for screening for malignant neoplasm of colon: Secondary | ICD-10-CM | POA: Diagnosis not present

## 2016-12-07 LAB — COLOGUARD

## 2016-12-07 LAB — TSH: TSH: 3.46 u[IU]/mL (ref 0.35–4.50)

## 2016-12-10 MED ORDER — LEVOTHYROXINE SODIUM 50 MCG PO TABS: 50.0000 ug | ORAL_TABLET | Freq: Every day | ORAL | 5 refills | Status: DC

## 2016-12-10 NOTE — Addendum Note (Signed)
Addended by: Lurlean Nanny on: 12/10/2016 01:50 PM   Modules accepted: Orders

## 2017-04-08 DIAGNOSIS — Z23 Encounter for immunization: Secondary | ICD-10-CM | POA: Diagnosis not present

## 2017-04-16 ENCOUNTER — Encounter: Payer: Self-pay | Admitting: Cardiology

## 2017-05-01 NOTE — Progress Notes (Signed)
[name] Main Date of Birth: 1955-09-05 Medical Record #[id number]  History of Present Illness: [name] is seen today for follow up CAD. She had remote coronary bypass surgery in 2002. She had prior stenting of the vein graft to the obtuse marginal vessel in 2009. She then had a drug-eluting stent placement to this same saphenous vein graft to the ramus in March of 2012. This subsequently occluded and she underwent angioplasty and drug-eluting stent to the ramus intermediate branch in September of 2012. Cardiac catheterization again in October of 2012 showed nonobstructive disease. Her last evaluation with [name] in July 2015 showed no ischemia and normal EF.   She also has a history of inflammatory bowel disease and COPD. She states she is depressed and is frustrated that she has been unable to quit. She has tried nicotine replacement products without success.   She does have periodic chest pain. It begins in her right jaw then develops a cramp in her chest. Relieved with sl Ntg. Occurs 1-2 x /month. She is on dexilant now with improvement in IBS.   Current Outpatient Medications on File Prior to Visit  Medication Sig Dispense Refill  . amLODipine (NORVASC) 5 MG tablet Take 5 mg daily by mouth.    . clopidogrel (PLAVIX) 75 MG tablet Take 1 tablet (75 mg total) by mouth daily. 90 tablet 3  . Dexlansoprazole 30 MG capsule Take 1 capsule (30 mg total) by mouth daily. 30 capsule 11  . diphenhydramine-acetaminophen (TYLENOL PM) 25-500 MG TABS Take 1 tablet by mouth at bedtime as needed (pain, sleep).    Marland Kitchen levothyroxine (SYNTHROID, LEVOTHROID) 50 MCG tablet Take 1 tablet (50 mcg total) by mouth daily. 30 tablet 5  . metoprolol succinate (TOPROL-XL) 50 MG 24 hr tablet Take 50 mg by mouth daily. Take with or immediately following a meal.    . omega-3 acid ethyl esters (LOVAZA) 1 g capsule Take 1 capsule (1 g total) by mouth 2 (two) times daily. 60 capsule 11  . rosuvastatin (CRESTOR) 20 MG  tablet Take 20 mg by mouth daily.    Marland Kitchen amLODipine (NORVASC) 5 MG tablet Take 1 tablet (5 mg total) by mouth daily. 90 tablet 3   Current Facility-Administered Medications on File Prior to Visit  Medication Dose Route Frequency Provider Last Rate Last Dose  . bupivacaine (MARCAINE) 0.5 % 15 mL, phenazopyridine (PYRIDIUM) 400 mg bladder mixture   Bladder Instillation Once Bjorn Loser, MD        Allergies  Allergen Reactions  . Ranitidine Hives  . Cimetidine Hives  . Sulfa Antibiotics Other (See Comments)    Childhood reaction    Past Medical History:  Diagnosis Date  . Anemia   . Anxiety   . Blood transfusion   . Chest pain, cardiac    Has chronic chest pain.   . Complication of anesthesia    "my blood pressure will drop out on me"  . Coronary artery disease    a) s/p CABG x 2 in [place] 2002. b)stenting to the SVG-OM in 2009. c)DES to the saphenous vein graft to the ramus in March 2012. d) s/p PTCA/DES to native ramus intermedius in September 2012. Last cath 03/2011 with minimal nonobstructive disease  e) indeterminant myoview 09/2011 but atypical CP & no perfusion defect in ramus intermediate distribution, EF 52%  . Depression   . 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 Enterocolitis   . Gastroparesis   . GERD (gastroesophageal  reflux disease)   . GI bleeding   . Hematuria 09/25/11   "lately"  . Hx: UTI (urinary tract infection)    multiple  . Hyperlipidemia   . Hypertension   . Hypothyroidism   . Migraines    h/o  . PTSD (post-traumatic stress disorder)   . Tobacco abuse     Past Surgical History:  Procedure Laterality Date  . ABDOMINAL HYSTERECTOMY  1980's  . BIOPSY STOMACH  09/25/11   "several in the last year; all negative; via endoscopy"  . BREAST SURGERY     right biopsy  . CHOLECYSTECTOMY  2000's  . CORONARY ANGIOPLASTY WITH STENT PLACEMENT     "lots; they've all collapsed; last time dr took native vein &  turned it around & put  stents in it before attaching"  . CORONARY ARTERY BYPASS GRAFT  2002   CABG X2  . PILONIDAL CYST / SINUS EXCISION  1980's   "was wrapped around my spinal cord; Dr. Norville Haggard got almost all of it except a tiny bit; said it was really deep; packed fatty tissue in the hole; really bothers me alot"  . SALPINGOOPHORECTOMY  1980's   "2 surgeries; 1st right; then left"  . TMJ ARTHROPLASTY  1980's   right side; "I've had to have it twice"  . VESICOVAGINAL FISTULA CLOSURE W/ TAH  1983    Social History   Tobacco Use  Smoking Status Former Smoker  . Packs/day: 0.25  . Years: 15.00  . Pack years: 3.75  . Types: Cigarettes  Smokeless Tobacco Never Used    Social History   Substance and Sexual Activity  Alcohol Use No    Family History  Problem Relation Age of Onset  . Emphysema Mother        smoked  . Asthma Mother   . Breast cancer Mother   . Heart disease Father   . Heart disease Paternal Grandfather     Review of Systems: The review of systems is as noted in HPI.   All other systems were reviewed and are negative.  Physical Exam: BP 106/78   Pulse 77   Ht 5' (1.524 m)   Wt 166 lb (75.3 kg)   BMI 32.42 kg/m  GENERAL:  Well appearing WF in NAD HEENT:  PERRL, EOMI, sclera are clear. Oropharynx is clear. NECK:  No jugular venous distention, carotid upstroke brisk and symmetric, no bruits, no thyromegaly or adenopathy LUNGS:  Clear to auscultation bilaterally CHEST:  Unremarkable HEART:  RRR,  PMI not displaced or sustained,S1 and S2 within normal limits, no S3, no S4: no clicks, no rubs, no murmurs ABD:  Soft, nontender. BS +, no masses or bruits. No hepatomegaly, no splenomegaly EXT:  2 + pulses throughout, no edema, no cyanosis no clubbing SKIN:  Warm and dry.  No rashes NEURO:  Alert and oriented x 3. Cranial nerves II through XII intact. PSYCH:  Cognitively intact    LABORATORY DATA:   Lab Results  Component Value Date   WBC 10.8 (H) 11/05/2016   HGB 15.5  (H) 11/05/2016   HCT 45.2 11/05/2016   PLT 279.0 11/05/2016   GLUCOSE 105 (H) 11/05/2016   CHOL 141 11/05/2016   TRIG 195.0 (H) 11/05/2016   HDL 33.30 (L) 11/05/2016   LDLCALC 69 11/05/2016   ALT 18 11/05/2016   AST 15 11/05/2016   NA 138 11/05/2016   K 4.5 11/05/2016   CL 107 11/05/2016   CREATININE 0.78 11/05/2016   BUN 14  11/05/2016   CO2 26 11/05/2016   TSH 3.46 12/07/2016   INR 1.07 09/25/2011   HGBA1C 5.8 11/05/2016   Ecg is  done today. NSR with normal Ecg. I have personally reviewed and interpreted this study.    Assessment / Plan: 1. Coronary disease with previous bypass and multiple interventions as noted above. She has chronic chest pain with atypical features.   Normal myoview in July 2015. We discussed updating a stress test but she has deferred.  Continue  Plavix, amlodipine, Toprol XL and Crestor.  2. Hypertension, well controlled on medication.  3. Tobacco abuse.  Encourage complete cessation. We discussed further treatment strategies including Chantix or Wellbutrin. She is afraid of Chantix but is willing to try Wellbutrin. Will start 150 mg daily for 3 days then increased to bid. Plan quit date in 1-2 weeks.  4. Hyperlipidemia. LDL well controlled on Crestor. Elevated triglycerides. Repeat fasting labs today.   5. COPD  6. Hypothyroidism. TSH 5.5. Will repeat next visit.

## 2017-05-02 ENCOUNTER — Other Ambulatory Visit: Payer: Self-pay | Admitting: *Deleted

## 2017-05-02 ENCOUNTER — Encounter: Payer: Self-pay | Admitting: Cardiology

## 2017-05-02 ENCOUNTER — Ambulatory Visit (INDEPENDENT_AMBULATORY_CARE_PROVIDER_SITE_OTHER): Payer: Medicare Other | Admitting: Cardiology

## 2017-05-02 VITALS — BP 106/78 | HR 77 | Ht 60.0 in | Wt 166.0 lb

## 2017-05-02 DIAGNOSIS — Z720 Tobacco use: Secondary | ICD-10-CM | POA: Diagnosis not present

## 2017-05-02 DIAGNOSIS — Z79899 Other long term (current) drug therapy: Secondary | ICD-10-CM | POA: Diagnosis not present

## 2017-05-02 DIAGNOSIS — I10 Essential (primary) hypertension: Secondary | ICD-10-CM | POA: Diagnosis not present

## 2017-05-02 DIAGNOSIS — E039 Hypothyroidism, unspecified: Secondary | ICD-10-CM

## 2017-05-02 DIAGNOSIS — E7800 Pure hypercholesterolemia, unspecified: Secondary | ICD-10-CM

## 2017-05-02 DIAGNOSIS — I209 Angina pectoris, unspecified: Secondary | ICD-10-CM

## 2017-05-02 DIAGNOSIS — I25708 Atherosclerosis of coronary artery bypass graft(s), unspecified, with other forms of angina pectoris: Secondary | ICD-10-CM

## 2017-05-02 LAB — COMPREHENSIVE METABOLIC PANEL
ALBUMIN: 4.7 g/dL (ref 3.5–5.5)
ALT: 15 IU/L (ref 0–32)
AST: 14 IU/L (ref 0–40)
Albumin/Globulin Ratio: 1.9 (ref 1.2–2.2)
Alkaline Phosphatase: 71 IU/L (ref 39–117)
BUN / CREAT RATIO: 17 (ref 9–23)
BUN: 12 mg/dL (ref 6–24)
Bilirubin Total: 0.3 mg/dL (ref 0.0–1.2)
CO2: 23 mmol/L (ref 20–29)
CREATININE: 0.72 mg/dL (ref 0.57–1.00)
Calcium: 10.1 mg/dL (ref 8.7–10.2)
Chloride: 100 mmol/L (ref 96–106)
GFR calc non Af Amer: 92 mL/min/{1.73_m2} (ref >59)
GFR, EST AFRICAN AMERICAN: 106 mL/min/{1.73_m2} (ref >59)
Globulin, Total: 2.5 g/dL (ref 1.5–4.5)
Glucose: 95 mg/dL (ref 65–99)
Potassium: 5.1 mmol/L (ref 3.5–5.2)
Sodium: 140 mmol/L (ref 134–144)
TOTAL PROTEIN: 7.2 g/dL (ref 6.0–8.5)

## 2017-05-02 LAB — LIPID PANEL
CHOL/HDL RATIO: 4.4 ratio (ref 0.0–4.4)
CHOLESTEROL TOTAL: 155 mg/dL (ref 100–199)
HDL: 35 mg/dL — AB (ref >39)
LDL CALC: 73 mg/dL (ref 0–99)
TRIGLYCERIDES: 236 mg/dL — AB (ref 0–149)
VLDL Cholesterol Cal: 47 mg/dL — ABNORMAL HIGH (ref 5–40)

## 2017-05-02 MED ORDER — OMEGA-3-ACID ETHYL ESTERS 1 G PO CAPS: 1.0000 g | ORAL_CAPSULE | Freq: Two times a day (BID) | ORAL | 3 refills | Status: DC

## 2017-05-02 MED ORDER — METOPROLOL SUCCINATE ER 50 MG PO TB24: 50.0000 mg | ORAL_TABLET | Freq: Every day | ORAL | 3 refills | Status: DC

## 2017-05-02 MED ORDER — BUPROPION HCL ER (SR) 150 MG PO TB12: 150.0000 mg | ORAL_TABLET | Freq: Two times a day (BID) | ORAL | 3 refills | Status: DC

## 2017-05-02 MED ORDER — ROSUVASTATIN CALCIUM 20 MG PO TABS: 20.0000 mg | ORAL_TABLET | Freq: Every day | ORAL | 3 refills | Status: DC

## 2017-05-02 MED ORDER — AMLODIPINE BESYLATE 5 MG PO TABS: 5.0000 mg | ORAL_TABLET | Freq: Every day | ORAL | 3 refills | Status: DC

## 2017-05-02 MED ORDER — CLOPIDOGREL BISULFATE 75 MG PO TABS: 75.0000 mg | ORAL_TABLET | Freq: Every day | ORAL | 3 refills | Status: DC

## 2017-05-02 NOTE — Addendum Note (Signed)
Addended by: Kathyrn Lass on: 05/02/2017 09:21 AM   Modules accepted: Orders

## 2017-05-02 NOTE — Patient Instructions (Addendum)
Start Wellbutrin 150 mg daily for three days and then 150 mg twice a day  Pick a quit date 1-2 weeks after starting medication then quit smoking.   Continue your other therapy

## 2017-05-04 LAB — TSH: TSH: 5.77 u[IU]/mL — AB (ref 0.450–4.500)

## 2017-05-08 MED ORDER — LEVOTHYROXINE SODIUM 75 MCG PO TABS: 75.0000 ug | ORAL_TABLET | Freq: Every day | ORAL | 0 refills | Status: DC

## 2017-06-06 ENCOUNTER — Other Ambulatory Visit: Payer: Medicare Other

## 2017-07-08 ENCOUNTER — Other Ambulatory Visit (INDEPENDENT_AMBULATORY_CARE_PROVIDER_SITE_OTHER): Payer: Medicare Other

## 2017-07-08 DIAGNOSIS — E039 Hypothyroidism, unspecified: Secondary | ICD-10-CM | POA: Diagnosis not present

## 2017-07-09 LAB — TSH: TSH: 4.27 u[IU]/mL (ref 0.450–4.500)

## 2017-07-09 LAB — SPECIMEN STATUS REPORT

## 2017-07-16 ENCOUNTER — Telehealth: Payer: Self-pay

## 2017-07-16 DIAGNOSIS — E039 Hypothyroidism, unspecified: Secondary | ICD-10-CM

## 2017-07-16 MED ORDER — LEVOTHYROXINE SODIUM 75 MCG PO TABS: 75.0000 ug | ORAL_TABLET | Freq: Every day | ORAL | 1 refills | Status: DC

## 2017-07-16 NOTE — Telephone Encounter (Signed)
Copied from [place] [id number]. Topic: General - Other >> Jul 16, 2017  9:18 AM [name] wrote: Reason for CRM: Patient calling to find out if she is supposed to keep taking levothyroxine 57mcg tab, if so she needs a refill. She says her labs will determine what medication she needs to take or change doses on, but she has not received a call back about her lab results from 07/08/2017 with Ba[facility]. Please advise.

## 2017-07-16 NOTE — Telephone Encounter (Signed)
Pt advised lab results of 07/08/17 as instructed. Pt voiced understanding and request refill levothyroxine 75 mcg to [facility] on [address]. pt last annual 11/05/16.  Refill done per protocol and pt will ck with pharmacy.

## 2017-08-28 ENCOUNTER — Telehealth: Payer: Self-pay

## 2017-08-28 NOTE — Telephone Encounter (Signed)
Spoke to patient we received a letter from your pharmacy your insurance will not cover [place].Dr.Jordan advised take fish oil 2 gms daily.

## 2017-09-05 ENCOUNTER — Telehealth: Payer: Self-pay | Admitting: Cardiology

## 2017-09-05 NOTE — Telephone Encounter (Signed)
New Message    *STAT* If patient is at the pharmacy, call can be transferred to refill team.   1. Which medications need to be refilled? (please list name of each medication and dose if known) clopidogrel (PLAVIX) 75 MG tablet, metoprolol succinate (TOPROL-XL) 50 MG 24 hr tablet, amLODipine (NORVASC) 5 MG tablet, rosuvastatin (CRESTOR) 20 MG tablet, ion (WELLBUTRIN SR) 150 MG 12 hr tablet 2. Which pharmacy/location (including street and city if local pharmacy) is medication to be sent to? [facility]  3. Do they need a 30 day or 90 day supply? 90 day

## 2017-09-06 MED ORDER — BUPROPION HCL ER (SR) 150 MG PO TB12: 150.0000 mg | ORAL_TABLET | Freq: Two times a day (BID) | ORAL | 1 refills | Status: DC

## 2017-09-06 MED ORDER — AMLODIPINE BESYLATE 5 MG PO TABS: 5.0000 mg | ORAL_TABLET | Freq: Every day | ORAL | 1 refills | Status: DC

## 2017-09-06 MED ORDER — CLOPIDOGREL BISULFATE 75 MG PO TABS: 75.0000 mg | ORAL_TABLET | Freq: Every day | ORAL | 1 refills | Status: DC

## 2017-09-06 MED ORDER — METOPROLOL SUCCINATE ER 50 MG PO TB24: 50.0000 mg | ORAL_TABLET | Freq: Every day | ORAL | 1 refills | Status: DC

## 2017-09-06 MED ORDER — ROSUVASTATIN CALCIUM 20 MG PO TABS: 20.0000 mg | ORAL_TABLET | Freq: Every day | ORAL | 1 refills | Status: DC

## 2017-10-29 ENCOUNTER — Ambulatory Visit: Payer: Medicare Other | Admitting: Cardiology

## 2017-11-03 NOTE — Progress Notes (Signed)
[name] Date of Birth: 07/20/1955 Medical Record #[id number]  History of Present Illness: [name] is seen today for follow up CAD. She had remote coronary bypass surgery in 2002. She had prior stenting of the vein graft to the obtuse marginal vessel in 2009. She then had a drug-eluting stent placement to this same saphenous vein graft to the ramus in March of 2012. This subsequently occluded and she underwent angioplasty and drug-eluting stent to the ramus intermediate branch in September of 2012. Cardiac catheterization again in October of 2012 showed nonobstructive disease. Her last evaluation with [name] in July 2015 showed no ischemia and normal EF.   She also has a history of inflammatory bowel disease and COPD. On her prior visit we placed her on Wellbutrin. She has not bee able to quit smoking but notes a marked improvement in her mood and energy level.  She has tried nicotine replacement products without success.   Today she states she is not having any chest pain. She has lost 4 lbs. She does clean house and plays with her granddaughter in the pool.   Current Outpatient Medications on File Prior to Visit  Medication Sig Dispense Refill  . amLODipine (NORVASC) 5 MG tablet Take 1 tablet (5 mg total) by mouth daily. 90 tablet 1  . buPROPion (WELLBUTRIN SR) 150 MG 12 hr tablet Take 1 tablet (150 mg total) by mouth 2 (two) times daily. 180 tablet 1  . clopidogrel (PLAVIX) 75 MG tablet Take 1 tablet (75 mg total) by mouth daily. 90 tablet 1  . Dexlansoprazole 30 MG capsule Take 1 capsule (30 mg total) by mouth daily. 30 capsule 11  . diphenhydramine-acetaminophen (TYLENOL PM) 25-500 MG TABS Take 1 tablet by mouth at bedtime as needed (pain, sleep).    Marland Kitchen levothyroxine (SYNTHROID, LEVOTHROID) 75 MCG tablet Take 1 tablet (75 mcg total) by mouth daily. 90 tablet 1  . metoprolol succinate (TOPROL-XL) 50 MG 24 hr tablet Take 1 tablet (50 mg total) by mouth daily. Take with or  immediately following a meal. 90 tablet 1  . rosuvastatin (CRESTOR) 20 MG tablet Take 1 tablet (20 mg total) by mouth daily. 90 tablet 1   Current Facility-Administered Medications on File Prior to Visit  Medication Dose Route Frequency Provider Last Rate Last Dose  . bupivacaine (MARCAINE) 0.5 % 15 mL, phenazopyridine (PYRIDIUM) 400 mg bladder mixture   Bladder Instillation Once Bjorn Loser, MD        Allergies  Allergen Reactions  . Ranitidine Hives  . Cimetidine Hives    Pt reported  . Sulfamethoxazole-Trimethoprim     Patient couldn't give specific allergy  . Sulfa Antibiotics Other (See Comments)    Childhood reaction    Past Medical History:  Diagnosis Date  . Anemia   . Anxiety   . Blood transfusion   . Chest pain, cardiac    Has chronic chest pain.   . Complication of anesthesia    "my blood pressure will drop out on me"  . Coronary artery disease    a) s/p CABG x 2 in [place] 2002. b)stenting to the SVG-OM in 2009. c)DES to the saphenous vein graft to the ramus in March 2012. d) s/p PTCA/DES to native ramus intermedius in September 2012. Last cath 03/2011 with minimal nonobstructive disease  e) indeterminant myoview 09/2011 but atypical CP & no perfusion defect in ramus intermediate distribution, EF 52%  . Depression   . Embolism - blood clot 2002   "in my  heart after bypass"  . Enterocolitis   . Gastroparesis   . GERD (gastroesophageal reflux disease)   . GI bleeding   . Hematuria 09/25/11   "lately"  . Hx: UTI (urinary tract infection)    multiple  . Hyperlipidemia   . Hypertension   . Hypothyroidism   . Migraines    h/o  . PTSD (post-traumatic stress disorder)   . Tobacco abuse     Past Surgical History:  Procedure Laterality Date  . ABDOMINAL HYSTERECTOMY  1980's  . BIOPSY STOMACH  09/25/11   "several in the last year; all negative; via endoscopy"  . BREAST SURGERY     right biopsy  . CHOLECYSTECTOMY  2000's  . CORONARY ANGIOPLASTY WITH STENT  PLACEMENT     "lots; they've all collapsed; last time dr took native vein &  turned it around & put stents in it before attaching"  . CORONARY ARTERY BYPASS GRAFT  2002   CABG X2  . CYSTO WITH HYDRODISTENSION  03/28/2012   Procedure: CYSTOSCOPY/HYDRODISTENSION;  Surgeon: Reece Packer, MD;  Location: [facility];  Service: Urology;  Laterality: N/A;  . CYSTOSCOPY W/ RETROGRADES  03/28/2012   Procedure: CYSTOSCOPY WITH RETROGRADE PYELOGRAM;  Surgeon: Reece Packer, MD;  Location: [facility];  Service: Urology;  Laterality: Bilateral;  . CYSTOSCOPY WITH INJECTION  03/28/2012   Procedure: CYSTOSCOPY WITH INJECTION;  Surgeon: Reece Packer, MD;  Location: [facility];  Service: Urology;  Laterality: N/A;  Marcaine and Pyridium  . PILONIDAL CYST / SINUS EXCISION  1980's   "was wrapped around my spinal cord; Dr. Norville Haggard got almost all of it except a tiny bit; said it was really deep; packed fatty tissue in the hole; really bothers me alot"  . SALPINGOOPHORECTOMY  1980's   "2 surgeries; 1st right; then left"  . TMJ ARTHROPLASTY  1980's   right side; "I've had to have it twice"  . VESICOVAGINAL FISTULA CLOSURE W/ TAH  1983    Social History   Tobacco Use  Smoking Status Former Smoker  . Packs/day: 0.25  . Years: 15.00  . Pack years: 3.75  . Types: Cigarettes  Smokeless Tobacco Never Used    Social History   Substance and Sexual Activity  Alcohol Use No    Family History  Problem Relation Age of Onset  . Emphysema Mother        smoked  . Asthma Mother   . Breast cancer Mother   . Heart disease Father   . Heart disease Paternal Grandfather     Review of Systems: The review of systems is as noted in HPI.   All other systems were reviewed and are negative.  Physical Exam: BP 120/74   Pulse 82   Ht 5' (1.524 m)   Wt 162 lb 6.4 oz (73.7 kg)   BMI 31.72 kg/m  GENERAL:  Well appearing WF in NAD HEENT:  PERRL, EOMI, sclera are clear. Oropharynx is clear. NECK:  No jugular  venous distention, carotid upstroke brisk and symmetric, no bruits, no thyromegaly or adenopathy LUNGS:  Clear to auscultation bilaterally CHEST:  Unremarkable HEART:  RRR,  PMI not displaced or sustained,S1 and S2 within normal limits, no S3, no S4: no clicks, no rubs, no murmurs ABD:  Soft, nontender. BS +, no masses or bruits. No hepatomegaly, no splenomegaly EXT:  2 + pulses throughout, no edema, no cyanosis no clubbing SKIN:  Warm and dry.  No rashes NEURO:  Alert and oriented x 3.  Cranial nerves II through XII intact. PSYCH:  Cognitively intact       LABORATORY DATA:   Lab Results  Component Value Date   WBC 10.8 (H) 11/05/2016   HGB 15.5 (H) 11/05/2016   HCT 45.2 11/05/2016   PLT 279.0 11/05/2016   GLUCOSE 95 05/02/2017   CHOL 155 05/02/2017   TRIG 236 (H) 05/02/2017   HDL 35 (L) 05/02/2017   LDLCALC 73 05/02/2017   ALT 15 05/02/2017   AST 14 05/02/2017   NA 140 05/02/2017   K 5.1 05/02/2017   CL 100 05/02/2017   CREATININE 0.72 05/02/2017   BUN 12 05/02/2017   CO2 23 05/02/2017   TSH 4.270 07/08/2017   INR 1.07 09/25/2011   HGBA1C 5.8 11/05/2016    Assessment / Plan: 1. Coronary disease with previous bypass and multiple interventions as noted above. She has chronic chest pain.  This has improved significantly since starting Wellbutrin.  Normal myoview in July 2015.   Continue  Plavix, amlodipine, Toprol XL and Crestor.  2. Hypertension, well controlled on medication.  3. Tobacco abuse.  Encourage complete cessation. She has not quit with Wellbutrin but actually feels like she will be able to.   4. Hyperlipidemia. LDL well controlled on Crestor. Elevated triglycerides. States she cannot afford fish oil. Eat a healthy diet.   5. COPD  6. Hypothyroidism. Followed by primary care.

## 2017-11-06 ENCOUNTER — Encounter: Payer: Self-pay | Admitting: Cardiology

## 2017-11-06 ENCOUNTER — Ambulatory Visit (INDEPENDENT_AMBULATORY_CARE_PROVIDER_SITE_OTHER): Payer: Medicare Other | Admitting: Cardiology

## 2017-11-06 VITALS — BP 120/74 | HR 82 | Ht 60.0 in | Wt 162.4 lb

## 2017-11-06 DIAGNOSIS — E7800 Pure hypercholesterolemia, unspecified: Secondary | ICD-10-CM | POA: Diagnosis not present

## 2017-11-06 DIAGNOSIS — Z720 Tobacco use: Secondary | ICD-10-CM

## 2017-11-06 DIAGNOSIS — I25708 Atherosclerosis of coronary artery bypass graft(s), unspecified, with other forms of angina pectoris: Secondary | ICD-10-CM

## 2017-11-06 DIAGNOSIS — I10 Essential (primary) hypertension: Secondary | ICD-10-CM

## 2017-11-06 NOTE — Patient Instructions (Addendum)
  Continue your current therapy  Keep trying to quit smoking.  I will see you in 6 months.

## 2017-12-24 ENCOUNTER — Other Ambulatory Visit: Payer: Self-pay | Admitting: Internal Medicine

## 2017-12-25 ENCOUNTER — Telehealth: Payer: Self-pay | Admitting: Internal Medicine

## 2017-12-25 NOTE — Telephone Encounter (Signed)
Copied from [place] [id number]. Topic: Quick Communication - See Telephone Encounter >> Dec 25, 2017  9:46 AM Conception Chancy, NT wrote: CRM for notification. See Telephone encounter for: 12/25/17.  Patient is calling and requesting a refill on Dexlansoprazole 30 MG capsule . Her next appointment is 02/15/18.   [facility] [place] - [address] [place] [address] Phone: [phone] Fax: [phone]

## 2017-12-25 NOTE — Telephone Encounter (Signed)
Per protocol will refill dexlansoprazole 30 mg # 30 x1that was requested by [facility]. Pt has AWV on 02/13/18.

## 2017-12-25 NOTE — Telephone Encounter (Signed)
Dexlansoprazole 30 MG refill Last Refill:12/03/16, 30 caps #11 refills Last OV:11/05/16 Next OV 02/13/18 PCP: [name], FNP Pharmacy: [facility] and routing for further consideration

## 2017-12-25 NOTE — Telephone Encounter (Signed)
Dexlansoprazole 30 MG refill Last Refill:12/03/16, 30 caps #11 refills Last OV:11/05/16 Next OV 02/13/18 PCP: [name], FNP Pharmacy: [facility] and routing for further consideration.

## 2018-01-13 ENCOUNTER — Other Ambulatory Visit: Payer: Self-pay

## 2018-01-13 MED ORDER — METOPROLOL SUCCINATE ER 50 MG PO TB24: 50.0000 mg | ORAL_TABLET | Freq: Every day | ORAL | 1 refills | Status: DC

## 2018-01-21 ENCOUNTER — Other Ambulatory Visit: Payer: Self-pay | Admitting: Internal Medicine

## 2018-01-21 DIAGNOSIS — E039 Hypothyroidism, unspecified: Secondary | ICD-10-CM

## 2018-02-13 ENCOUNTER — Encounter: Payer: Medicare Other | Admitting: Internal Medicine

## 2018-02-25 ENCOUNTER — Other Ambulatory Visit: Payer: Self-pay | Admitting: Internal Medicine

## 2018-02-25 DIAGNOSIS — E039 Hypothyroidism, unspecified: Secondary | ICD-10-CM

## 2018-03-27 ENCOUNTER — Ambulatory Visit (INDEPENDENT_AMBULATORY_CARE_PROVIDER_SITE_OTHER): Payer: Medicare Other | Admitting: Internal Medicine

## 2018-03-27 ENCOUNTER — Encounter: Payer: Self-pay | Admitting: Internal Medicine

## 2018-03-27 VITALS — BP 120/76 | HR 93 | Temp 98.2°F | Wt 159.0 lb

## 2018-03-27 DIAGNOSIS — Z23 Encounter for immunization: Secondary | ICD-10-CM | POA: Diagnosis not present

## 2018-03-27 DIAGNOSIS — J449 Chronic obstructive pulmonary disease, unspecified: Secondary | ICD-10-CM | POA: Diagnosis not present

## 2018-03-27 DIAGNOSIS — F32A Depression, unspecified: Secondary | ICD-10-CM

## 2018-03-27 DIAGNOSIS — I10 Essential (primary) hypertension: Secondary | ICD-10-CM

## 2018-03-27 DIAGNOSIS — E039 Hypothyroidism, unspecified: Secondary | ICD-10-CM | POA: Diagnosis not present

## 2018-03-27 DIAGNOSIS — E559 Vitamin D deficiency, unspecified: Secondary | ICD-10-CM | POA: Diagnosis not present

## 2018-03-27 DIAGNOSIS — F329 Major depressive disorder, single episode, unspecified: Secondary | ICD-10-CM | POA: Diagnosis not present

## 2018-03-27 DIAGNOSIS — E7800 Pure hypercholesterolemia, unspecified: Secondary | ICD-10-CM | POA: Diagnosis not present

## 2018-03-27 DIAGNOSIS — I25708 Atherosclerosis of coronary artery bypass graft(s), unspecified, with other forms of angina pectoris: Secondary | ICD-10-CM | POA: Diagnosis not present

## 2018-03-27 DIAGNOSIS — Z0000 Encounter for general adult medical examination without abnormal findings: Secondary | ICD-10-CM

## 2018-03-27 DIAGNOSIS — F419 Anxiety disorder, unspecified: Secondary | ICD-10-CM

## 2018-03-27 DIAGNOSIS — K580 Irritable bowel syndrome with diarrhea: Secondary | ICD-10-CM | POA: Diagnosis not present

## 2018-03-27 DIAGNOSIS — K219 Gastro-esophageal reflux disease without esophagitis: Secondary | ICD-10-CM | POA: Diagnosis not present

## 2018-03-27 LAB — CBC
HCT: 43.7 % (ref 36.0–46.0)
Hemoglobin: 15 g/dL (ref 12.0–15.0)
MCHC: 34.4 g/dL (ref 30.0–36.0)
MCV: 92.6 fl (ref 78.0–100.0)
PLATELETS: 269 10*3/uL (ref 150.0–400.0)
RBC: 4.72 Mil/uL (ref 3.87–5.11)
RDW: 13.1 % (ref 11.5–15.5)
WBC: 9.9 10*3/uL (ref 4.0–10.5)

## 2018-03-27 LAB — LDL CHOLESTEROL, DIRECT: Direct LDL: 91 mg/dL

## 2018-03-27 LAB — COMPREHENSIVE METABOLIC PANEL
ALT: 10 U/L (ref 0–35)
AST: 12 U/L (ref 0–37)
Albumin: 4.3 g/dL (ref 3.5–5.2)
Alkaline Phosphatase: 65 U/L (ref 39–117)
BUN: 13 mg/dL (ref 6–23)
CHLORIDE: 104 meq/L (ref 96–112)
CO2: 30 meq/L (ref 19–32)
CREATININE: 0.72 mg/dL (ref 0.40–1.20)
Calcium: 9.8 mg/dL (ref 8.4–10.5)
GFR: 87.73 mL/min (ref >60.00)
GLUCOSE: 105 mg/dL — AB (ref 70–99)
Potassium: 4.4 mEq/L (ref 3.5–5.1)
SODIUM: 138 meq/L (ref 135–145)
Total Bilirubin: 0.3 mg/dL (ref 0.2–1.2)
Total Protein: 7.1 g/dL (ref 6.0–8.3)

## 2018-03-27 LAB — LIPID PANEL
Cholesterol: 150 mg/dL (ref 0–200)
HDL: 31.9 mg/dL — AB (ref >39.00)
NONHDL: 118.47
Total CHOL/HDL Ratio: 5
Triglycerides: 303 mg/dL — ABNORMAL HIGH (ref 0.0–149.0)
VLDL: 60.6 mg/dL — AB (ref 0.0–40.0)

## 2018-03-27 LAB — TSH: TSH: 3.22 u[IU]/mL (ref 0.35–4.50)

## 2018-03-27 LAB — VITAMIN D 25 HYDROXY (VIT D DEFICIENCY, FRACTURES): VITD: 13.47 ng/mL — ABNORMAL LOW (ref 30.00–100.00)

## 2018-03-27 LAB — T4, FREE: Free T4: 0.82 ng/dL (ref 0.60–1.60)

## 2018-03-27 NOTE — Patient Instructions (Signed)
Health Maintenance for Postmenopausal Women Menopause is a normal process in which your reproductive ability comes to an end. This process happens gradually over a span of months to years, usually between the ages of 22 and 9. Menopause is complete when you have missed 12 consecutive menstrual periods. It is important to talk with your health care provider about some of the most common conditions that affect postmenopausal women, such as heart disease, cancer, and bone loss (osteoporosis). Adopting a healthy lifestyle and getting preventive care can help to promote your health and wellness. Those actions can also lower your chances of developing some of these common conditions. What should I know about menopause? During menopause, you may experience a number of symptoms, such as:  Moderate-to-severe hot flashes.  Night sweats.  Decrease in sex drive.  Mood swings.  Headaches.  Tiredness.  Irritability.  Memory problems.  Insomnia.  Choosing to treat or not to treat menopausal changes is an individual decision that you make with your health care provider. What should I know about hormone replacement therapy and supplements? Hormone therapy products are effective for treating symptoms that are associated with menopause, such as hot flashes and night sweats. Hormone replacement carries certain risks, especially as you become older. If you are thinking about using estrogen or estrogen with progestin treatments, discuss the benefits and risks with your health care provider. What should I know about heart disease and stroke? Heart disease, heart attack, and stroke become more likely as you age. This may be due, in part, to the hormonal changes that your body experiences during menopause. These can affect how your body processes dietary fats, triglycerides, and cholesterol. Heart attack and stroke are both medical emergencies. There are many things that you can do to help prevent heart disease  and stroke:  Have your blood pressure checked at least every 1-2 years. High blood pressure causes heart disease and increases the risk of stroke.  If you are 53-22 years old, ask your health care provider if you should take aspirin to prevent a heart attack or a stroke.  Do not use any tobacco products, including cigarettes, chewing tobacco, or electronic cigarettes. If you need help quitting, ask your health care provider.  It is important to eat a healthy diet and maintain a healthy weight. ? Be sure to include plenty of vegetables, fruits, low-fat dairy products, and lean protein. ? Avoid eating foods that are high in solid fats, added sugars, or salt (sodium).  Get regular exercise. This is one of the most important things that you can do for your health. ? Try to exercise for at least 150 minutes each week. The type of exercise that you do should increase your heart rate and make you sweat. This is known as moderate-intensity exercise. ? Try to do strengthening exercises at least twice each week. Do these in addition to the moderate-intensity exercise.  Know your numbers.Ask your health care provider to check your cholesterol and your blood glucose. Continue to have your blood tested as directed by your health care provider.  What should I know about cancer screening? There are several types of cancer. Take the following steps to reduce your risk and to catch any cancer development as early as possible. Breast Cancer  Practice breast self-awareness. ? This means understanding how your breasts normally appear and feel. ? It also means doing regular breast self-exams. Let your health care provider know about any changes, no matter how small.  If you are 40  or older, have a clinician do a breast exam (clinical breast exam or CBE) every year. Depending on your age, family history, and medical history, it may be recommended that you also have a yearly breast X-ray (mammogram).  If you  have a family history of breast cancer, talk with your health care provider about genetic screening.  If you are at high risk for breast cancer, talk with your health care provider about having an MRI and a mammogram every year.  Breast cancer (BRCA) gene test is recommended for women who have family members with BRCA-related cancers. Results of the assessment will determine the need for genetic counseling and BRCA1 and for BRCA2 testing. BRCA-related cancers include these types: ? Breast. This occurs in males or females. ? Ovarian. ? Tubal. This may also be called fallopian tube cancer. ? Cancer of the abdominal or pelvic lining (peritoneal cancer). ? Prostate. ? Pancreatic.  Cervical, Uterine, and Ovarian Cancer Your health care provider may recommend that you be screened regularly for cancer of the pelvic organs. These include your ovaries, uterus, and vagina. This screening involves a pelvic exam, which includes checking for microscopic changes to the surface of your cervix (Pap test).  For women ages 21-65, health care providers may recommend a pelvic exam and a Pap test every three years. For women ages 79-65, they may recommend the Pap test and pelvic exam, combined with testing for human papilloma virus (HPV), every five years. Some types of HPV increase your risk of cervical cancer. Testing for HPV may also be done on women of any age who have unclear Pap test results.  Other health care providers may not recommend any screening for nonpregnant women who are considered low risk for pelvic cancer and have no symptoms. Ask your health care provider if a screening pelvic exam is right for you.  If you have had past treatment for cervical cancer or a condition that could lead to cancer, you need Pap tests and screening for cancer for at least 20 years after your treatment. If Pap tests have been discontinued for you, your risk factors (such as having a new sexual partner) need to be  reassessed to determine if you should start having screenings again. Some women have medical problems that increase the chance of getting cervical cancer. In these cases, your health care provider may recommend that you have screening and Pap tests more often.  If you have a family history of uterine cancer or ovarian cancer, talk with your health care provider about genetic screening.  If you have vaginal bleeding after reaching menopause, tell your health care provider.  There are currently no reliable tests available to screen for ovarian cancer.  Lung Cancer Lung cancer screening is recommended for adults 69-62 years old who are at high risk for lung cancer because of a history of smoking. A yearly low-dose CT scan of the lungs is recommended if you:  Currently smoke.  Have a history of at least 30 pack-years of smoking and you currently smoke or have quit within the past 15 years. A pack-year is smoking an average of one pack of cigarettes per day for one year.  Yearly screening should:  Continue until it has been 15 years since you quit.  Stop if you develop a health problem that would prevent you from having lung cancer treatment.  Colorectal Cancer  This type of cancer can be detected and can often be prevented.  Routine colorectal cancer screening usually begins at  age 42 and continues through age 45.  If you have risk factors for colon cancer, your health care provider may recommend that you be screened at an earlier age.  If you have a family history of colorectal cancer, talk with your health care provider about genetic screening.  Your health care provider may also recommend using home test kits to check for hidden blood in your stool.  A small camera at the end of a tube can be used to examine your colon directly (sigmoidoscopy or colonoscopy). This is done to check for the earliest forms of colorectal cancer.  Direct examination of the colon should be repeated every  5-10 years until age 71. However, if early forms of precancerous polyps or small growths are found or if you have a family history or genetic risk for colorectal cancer, you may need to be screened more often.  Skin Cancer  Check your skin from head to toe regularly.  Monitor any moles. Be sure to tell your health care provider: ? About any new moles or changes in moles, especially if there is a change in a mole's shape or color. ? If you have a mole that is larger than the size of a pencil eraser.  If any of your family members has a history of skin cancer, especially at a young age, talk with your health care provider about genetic screening.  Always use sunscreen. Apply sunscreen liberally and repeatedly throughout the day.  Whenever you are outside, protect yourself by wearing long sleeves, pants, a wide-brimmed hat, and sunglasses.  What should I know about osteoporosis? Osteoporosis is a condition in which bone destruction happens more quickly than new bone creation. After menopause, you may be at an increased risk for osteoporosis. To help prevent osteoporosis or the bone fractures that can happen because of osteoporosis, the following is recommended:  If you are 46-71 years old, get at least 1,000 mg of calcium and at least 600 mg of vitamin D per day.  If you are older than age 55 but younger than age 65, get at least 1,200 mg of calcium and at least 600 mg of vitamin D per day.  If you are older than age 54, get at least 1,200 mg of calcium and at least 800 mg of vitamin D per day.  Smoking and excessive alcohol intake increase the risk of osteoporosis. Eat foods that are rich in calcium and vitamin D, and do weight-bearing exercises several times each week as directed by your health care provider. What should I know about how menopause affects my mental health? Depression may occur at any age, but it is more common as you become older. Common symptoms of depression  include:  Low or sad mood.  Changes in sleep patterns.  Changes in appetite or eating patterns.  Feeling an overall lack of motivation or enjoyment of activities that you previously enjoyed.  Frequent crying spells.  Talk with your health care provider if you think that you are experiencing depression. What should I know about immunizations? It is important that you get and maintain your immunizations. These include:  Tetanus, diphtheria, and pertussis (Tdap) booster vaccine.  Influenza every year before the flu season begins.  Pneumonia vaccine.  Shingles vaccine.  Your health care provider may also recommend other immunizations. This information is not intended to replace advice given to you by your health care provider. Make sure you discuss any questions you have with your health care provider. Document Released: 08/03/2005  Document Revised: 12/30/2015 Document Reviewed: 03/15/2015 Elsevier Interactive Patient Education  2018 [employer]  

## 2018-03-27 NOTE — Assessment & Plan Note (Signed)
CMET and Lipid profile today Encouraged her to consume a low fat diet Continue Rosuvastatin for now 

## 2018-03-27 NOTE — Assessment & Plan Note (Signed)
Controlled on Dexilant CBC and CMET today Will monitor

## 2018-03-27 NOTE — Assessment & Plan Note (Signed)
With angina Continue Plavix, Metoprolol and Rosuvastatin She will continue to follow with cardiology

## 2018-03-27 NOTE — Assessment & Plan Note (Signed)
Encouraged smoking cessation- she declines

## 2018-03-27 NOTE — Assessment & Plan Note (Signed)
TSH and Free T4 today Will adjust Levothyroxine if needed based on labs 

## 2018-03-27 NOTE — Assessment & Plan Note (Signed)
Stable on Wellbutrin Support offered today

## 2018-03-27 NOTE — Progress Notes (Signed)
HPI:  Pt presents to the clinic today for her Medicare Wellness Exam. She is also due to follow up chronic conditions.  History of Anemia: Her last H/H were 15.5/45.2. She denies s/s of bleeding. She is not taking an iron supplement OTC.  Anxiety and Depression: Triggered by general life stress. She has mild PTSD. She is taking Wellbutrin as prescribed. She denies SI/HI.  HLD with CAD: s/p CABG with stents. Intermittent angina. Her last LDL was 73, triglycerides 236, 04/2017. She is taking Plavix, Metoprolol, Rosuvastatin as prescribed. ECG from 04/2017 reviewed. Echo from 7/2015reviewed.  GERD with Gastroparesis: She is not sure what triggers this. She denies breakthrough on Dexilant. There is no upper GI to review.  COPD: Controlled without inhalers. She does continue to smoke. PFT's from 2013 reviewed.  IBS: Mainly diarrhea. She is not taking anything OTC or prescription for this but just tries to manage with diet.  HTN: Her BP today is 120/76. She is taking Amlodipine and Metoprolol as prescribed. ECG from  04/2017 reviewed.  Hypothyroidism: She denies any issues on her current dose of Levothyroxine. She is due to have her labs repeated today.  Past Medical History:  Diagnosis Date  . Anemia   . Anxiety   . Blood transfusion   . Chest pain, cardiac    Has chronic chest pain.   . Complication of anesthesia    "my blood pressure will drop out on me"  . Coronary artery disease    a) s/p CABG x 2 in [place] 2002. b)stenting to the SVG-OM in 2009. c)DES to the saphenous vein graft to the ramus in March 2012. d) s/p PTCA/DES to native ramus intermedius in September 2012. Last cath 03/2011 with minimal nonobstructive disease  e) indeterminant myoview 09/2011 but atypical CP & no perfusion defect in ramus intermediate distribution, EF 52%  . Depression   . 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 Enterocolitis   . Gastroparesis   . GERD (gastroesophageal reflux  disease)   . GI bleeding   . Hematuria 09/25/11   "lately"  . Hx: UTI (urinary tract infection)    multiple  . Hyperlipidemia   . Hypertension   . Hypothyroidism   . Migraines    h/o  . PTSD (post-traumatic stress disorder)   . Tobacco abuse     Current Outpatient Medications  Medication Sig Dispense Refill  . amLODipine (NORVASC) 5 MG tablet Take 1 tablet (5 mg total) by mouth daily. 90 tablet 1  . buPROPion (WELLBUTRIN SR) 150 MG 12 hr tablet Take 1 tablet (150 mg total) by mouth 2 (two) times daily. 180 tablet 1  . clopidogrel (PLAVIX) 75 MG tablet Take 1 tablet (75 mg total) by mouth daily. 90 tablet 1  . DEXILANT 30 MG capsule TAKE ONE CAPSULE BY MOUTH ONCE DAILY 30 capsule 1  . diphenhydramine-acetaminophen (TYLENOL PM) 25-500 MG TABS Take 1 tablet by mouth at bedtime as needed (pain, sleep).    Marland Kitchen levothyroxine (SYNTHROID, LEVOTHROID) 75 MCG tablet TAKE 1 TABLET BY MOUTH ONCE DAILY 90 tablet 0  . metoprolol succinate (TOPROL-XL) 50 MG 24 hr tablet Take 1 tablet (50 mg total) by mouth daily. Take with or immediately following a meal. 90 tablet 1  . rosuvastatin (CRESTOR) 20 MG tablet Take 1 tablet (20 mg total) by mouth daily. 90 tablet 1   No current facility-administered medications for this visit.    Facility-Administered Medications Ordered in Other Visits  Medication Dose Route Frequency Provider Last Rate Last Dose  . bupivacaine (MARCAINE) 0.5 % 15 mL, phenazopyridine (PYRIDIUM) 400 mg bladder mixture   Bladder Instillation Once Bjorn Loser, MD        Allergies  Allergen Reactions  . Ranitidine Hives  . Cimetidine Hives    Pt reported  . Sulfamethoxazole-Trimethoprim     Patient couldn't give specific allergy  . Sulfa Antibiotics Other (See Comments)    Childhood reaction    Family History  Problem Relation Age of Onset  . Emphysema Mother        smoked  . Asthma Mother   . Breast cancer Mother   . Heart disease Father   . Heart disease Paternal  Grandfather     Social History   Socioeconomic History  . Marital status: Widowed    Spouse name: Not on file  . Number of children: 2  . Years of education: Not on file  . Highest education level: Not on file  Occupational History  . Occupation: Disabled  Social Needs  . Financial resource strain: Not on file  . Food insecurity:    Worry: Not on file    Inability: Not on file  . Transportation needs:    Medical: Not on file    Non-medical: Not on file  Tobacco Use  . Smoking status: Former Smoker    Packs/day: 0.25    Years: 15.00    Pack years: 3.75    Types: Cigarettes  . Smokeless tobacco: Never Used  Substance and Sexual Activity  . Alcohol use: No  . Drug use: Yes    Types: Marijuana    Comment: "as a teen"  . Sexual activity: Not Currently  Lifestyle  . Physical activity:    Days per week: Not on file    Minutes per session: Not on file  . Stress: Not on file  Relationships  . Social connections:    Talks on phone: Not on file    Gets together: Not on file    Attends religious service: Not on file    Active member of club or organization: Not on file    Attends meetings of clubs or organizations: Not on file    Relationship status: Not on file  . Intimate partner violence:    Fear of current or ex partner: Not on file    Emotionally abused: Not on file    Physically abused: Not on file    Forced sexual activity: Not on file  Other Topics Concern  . Not on file  Social History Narrative  . Not on file    Hospitiliaztions: None  Health Maintenance:    Flu: 03/2017  Tetanus: 05/2012  Pneumovax: 03/2017  Prevnar: 03/2015  Shingrix: never  Mammogram: more than 5 years  Pap Smear: hysterectomy  Bone Density: never  Colon Screening: 11/2016  Eye Doctor: as needed  Dental Exam: as needed   Providers:   PCP: [name], NP-C  Cardiologist: Dr. [place]   I have personally reviewed and have noted:  1. The patient's medical and social  history 2. Their use of alcohol, tobacco or illicit drugs 3. Their current medications and supplements 4. The patient's functional ability including ADL's, fall risks, home safety risks and hearing or visual impairment. 5. Diet and physical activities 6. Evidence for depression or mood disorder  Subjective:   Review of Systems:   Constitutional: Denies fever, malaise, fatigue, headache or abrupt weight changes.  HEENT: Denies eye pain,  eye redness, ear pain, ringing in the ears, wax buildup, runny nose, nasal congestion, bloody nose, or sore throat. Respiratory: Denies difficulty breathing, shortness of breath, cough or sputum production.   Cardiovascular: Denies chest pain, chest tightness, palpitations or swelling in the hands or feet.  Gastrointestinal: Pt reports diarrhea. Denies abdominal pain, bloating, constipation, or blood in the stool.  GU: Denies urgency, frequency, pain with urination, burning sensation, blood in urine, odor or discharge. Musculoskeletal: Denies decrease in range of motion, difficulty with gait, muscle pain or joint pain and swelling.  Skin: Denies redness, rashes, lesions or ulcercations.  Neurological: Denies dizziness, difficulty with memory, difficulty with speech or problems with balance and coordination.  Psych: Pt has a history of anxiety and depression. Denies SI/HI.  No other specific complaints in a complete review of systems (except as listed in HPI above).  Objective:  PE:   BP 120/76   Pulse 93   Temp 98.2 F (36.8 C) (Oral)   Wt 159 lb (72.1 kg)   SpO2 97%   BMI 31.05 kg/m   Wt Readings from Last 3 Encounters:  11/06/17 162 lb 6.4 oz (73.7 kg)  05/02/17 166 lb (75.3 kg)  11/05/16 168 lb (76.2 kg)    General: Appears her stated age, well developed, well nourished in NAD. Skin: Warm, dry and intact.  HEENT: Head: normal shape and size; Eyes: sclera white, no icterus, conjunctiva pink, PERRLA and EOMs intact; Ears: Tm's gray and  intact, normal light reflex; Throat/Mouth: Teeth present, mucosa pink and moist, no exudate, lesions or ulcerations noted.  Neck: Neck supple, trachea midline. No masses, lumps or thyromegaly present.  Cardiovascular: Normal rate and rhythm. S1,S2 noted.  No murmur, rubs or gallops noted. No JVD or BLE edema. No carotid bruits noted. Pulmonary/Chest: Normal effort and positive vesicular breath sounds. No respiratory distress. No wheezes, rales or ronchi noted.  Abdomen: Soft and nontender. Normal bowel sounds. No distention or masses noted. Liver, spleen and kidneys non palpable. Musculoskeletal: Strength 5/5 BUE/BLE. No signs of joint swelling.  Neurological: Alert and oriented. Cranial nerves II-XII grossly intact. Coordination normal.  Psychiatric: Mood and affect normal. Behavior is normal. Judgment and thought content normal.    BMET    Component Value Date/Time   NA 140 05/02/2017 0835   K 5.1 05/02/2017 0835   CL 100 05/02/2017 0835   CO2 23 05/02/2017 0835   GLUCOSE 95 05/02/2017 0835   GLUCOSE 105 (H) 11/05/2016 1026   BUN 12 05/02/2017 0835   CREATININE 0.72 05/02/2017 0835   CREATININE 0.72 06/05/2016 0814   CALCIUM 10.1 05/02/2017 0835   GFRNONAA 92 05/02/2017 0835   GFRAA 106 05/02/2017 0835    Lipid Panel     Component Value Date/Time   CHOL 155 05/02/2017 0833   TRIG 236 (H) 05/02/2017 0833   HDL 35 (L) 05/02/2017 0833   CHOLHDL 4.4 05/02/2017 0833   CHOLHDL 4 11/05/2016 1026   VLDL 39.0 11/05/2016 1026   LDLCALC 73 05/02/2017 0833    CBC    Component Value Date/Time   WBC 10.8 (H) 11/05/2016 1026   RBC 4.79 11/05/2016 1026   HGB 15.5 (H) 11/05/2016 1026   HCT 45.2 11/05/2016 1026   PLT 279.0 11/05/2016 1026   MCV 94.3 11/05/2016 1026   MCH 32.0 06/05/2016 0814   MCHC 34.2 11/05/2016 1026   RDW 13.4 11/05/2016 1026   LYMPHSABS 3,128 06/05/2016 0814   MONOABS 460 06/05/2016 0814   EOSABS 184 06/05/2016 6812  BASOSABS 0 06/05/2016 0814    Hgb  A1C Lab Results  Component Value Date   HGBA1C 5.8 11/05/2016      Assessment and Plan:   Medicare Annual Wellness Visit:  Diet: She does eat meat. She consumes fruits and veggies daily. She does eat some fried foods. She drinks mostly soda, tea, water and coffee. Physical activity: cleaning, watching grandkids Depression/mood screen: Positive, but stable on meds Hearing: Intact to whispered voice Visual acuity: Grossly normal ADLs: Capable Fall risk: None Home safety: Good Cognitive evaluation: Intact to orientation, naming, recall and repetition EOL planning: No adv directives, full code/ I agree  Preventative Medicine: Flu shot today. Tetanus, pneumovax and prevnar UTD. She will check with insurance company re: Shingrix. She declines mammogram and bone density. No longer needs pap smears. Colon screening UTD. Encouraged her to consume a balanced diet and exercise regimen. Advised her to see an eye doctor and dentist annually. Will check CBC, CMET, Lipid, TSH, Free T4, and Vit D today.   Next appointment: 1 year, Medicare Wellness Exam   [name], NP

## 2018-03-27 NOTE — Assessment & Plan Note (Signed)
Continue FODMAP diet °

## 2018-03-27 NOTE — Assessment & Plan Note (Signed)
Controlled on Amlodipine and Metoprolol Reinforced DASH diet CBC and CMET today

## 2018-04-02 MED ORDER — VITAMIN D (ERGOCALCIFEROL) 1.25 MG (50000 UNIT) PO CAPS: 50000.0000 [IU] | ORAL_CAPSULE | ORAL | 0 refills | Status: DC

## 2018-04-02 MED ORDER — FENOFIBRATE 54 MG PO TABS: 54.0000 mg | ORAL_TABLET | Freq: Every day | ORAL | 2 refills | Status: DC

## 2018-04-02 NOTE — Addendum Note (Signed)
Addended by: Lurlean Nanny on: 04/02/2018 10:04 AM   Modules accepted: Orders

## 2018-04-20 ENCOUNTER — Other Ambulatory Visit: Payer: Self-pay | Admitting: Cardiology

## 2018-04-29 ENCOUNTER — Other Ambulatory Visit: Payer: Self-pay | Admitting: Internal Medicine

## 2018-05-16 NOTE — Progress Notes (Signed)
[name] Date of Birth: 1955/06/30 Medical Record #[id number]  History of Present Illness: [name] is seen today for follow up CAD. She had remote coronary bypass surgery in 2002. She had prior stenting of the vein graft to the obtuse marginal vessel in 2009. She then had a drug-eluting stent placement to this same saphenous vein graft to the ramus in March of 2012. This subsequently occluded and she underwent angioplasty and drug-eluting stent to the ramus intermediate branch in September of 2012. Cardiac catheterization again in October of 2012 showed nonobstructive disease. Her last evaluation with [name] in July 2015 showed no ischemia and normal EF.  On follow up today she notes that the past month she has had more generalized aches including leg and feet cramps. She has had some chest pain and generally hurts all over. One day she had some achy chest pain lasting all day. States this is different from her cardiac pain. She has used Ntg a few occasions without benefit. She was started on fenofibrate by Dr. Garnette Gunner for elevated triglycerides. She notes her younger sister had an MI and had a stent placed.   Current Outpatient Medications on File Prior to Visit  Medication Sig Dispense Refill  . amLODipine (NORVASC) 5 MG tablet Take 1 tablet (5 mg total) by mouth daily. 90 tablet 1  . buPROPion (WELLBUTRIN SR) 150 MG 12 hr tablet Take 1 tablet (150 mg total) by mouth 2 (two) times daily. 180 tablet 1  . clopidogrel (PLAVIX) 75 MG tablet TAKE 1 TABLET BY MOUTH DAILY 90 tablet 1  . DEXILANT 30 MG capsule TAKE ONE CAPSULE BY MOUTH ONCE DAILY 30 capsule 5  . diphenhydramine-acetaminophen (TYLENOL PM) 25-500 MG TABS Take 1 tablet by mouth at bedtime as needed (pain, sleep).    . fenofibrate 54 MG tablet Take 1 tablet (54 mg total) by mouth daily. 30 tablet 2  . levothyroxine (SYNTHROID, LEVOTHROID) 75 MCG tablet TAKE 1 TABLET BY MOUTH ONCE DAILY 90 tablet 0  . metoprolol succinate  (TOPROL-XL) 50 MG 24 hr tablet Take 1 tablet (50 mg total) by mouth daily. Take with or immediately following a meal. 90 tablet 1  . rosuvastatin (CRESTOR) 20 MG tablet Take 1 tablet (20 mg total) by mouth daily. 90 tablet 1   Current Facility-Administered Medications on File Prior to Visit  Medication Dose Route Frequency Provider Last Rate Last Dose  . bupivacaine (MARCAINE) 0.5 % 15 mL, phenazopyridine (PYRIDIUM) 400 mg bladder mixture   Bladder Instillation Once Bjorn Loser, MD        Allergies  Allergen Reactions  . Ranitidine Hives  . Cimetidine Hives    Pt reported  . Sulfamethoxazole-Trimethoprim     Patient couldn't give specific allergy  . Sulfa Antibiotics Other (See Comments)    Childhood reaction    Past Medical History:  Diagnosis Date  . Anemia   . Anxiety   . Blood transfusion   . Chest pain, cardiac    Has chronic chest pain.   . Complication of anesthesia    "my blood pressure will drop out on me"  . Coronary artery disease    a) s/p CABG x 2 in [place] 2002. b)stenting to the SVG-OM in 2009. c)DES to the saphenous vein graft to the ramus in March 2012. d) s/p PTCA/DES to native ramus intermedius in September 2012. Last cath 03/2011 with minimal nonobstructive disease  e) indeterminant myoview 09/2011 but atypical CP & no perfusion defect in ramus intermediate distribution,  EF 52%  . Depression   . 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 Enterocolitis   . Gastroparesis   . GERD (gastroesophageal reflux disease)   . GI bleeding   . Hematuria 09/25/11   "lately"  . Hx: UTI (urinary tract infection)    multiple  . Hyperlipidemia   . Hypertension   . Hypothyroidism   . Migraines    h/o  . PTSD (post-traumatic stress disorder)   . Tobacco abuse     Past Surgical History:  Procedure Laterality Date  . ABDOMINAL HYSTERECTOMY  1980's  . BIOPSY STOMACH  09/25/11   "several in the last year; all negative; via endoscopy"  . BREAST  SURGERY     right biopsy  . CHOLECYSTECTOMY  2000's  . CORONARY ANGIOPLASTY WITH STENT PLACEMENT     "lots; they've all collapsed; last time dr took native vein &  turned it around & put stents in it before attaching"  . CORONARY ARTERY BYPASS GRAFT  2002   CABG X2  . CYSTO WITH HYDRODISTENSION  03/28/2012   Procedure: CYSTOSCOPY/HYDRODISTENSION;  Surgeon: Reece Packer, MD;  Location: [facility];  Service: Urology;  Laterality: N/A;  . CYSTOSCOPY W/ RETROGRADES  03/28/2012   Procedure: CYSTOSCOPY WITH RETROGRADE PYELOGRAM;  Surgeon: Reece Packer, MD;  Location: [facility];  Service: Urology;  Laterality: Bilateral;  . CYSTOSCOPY WITH INJECTION  03/28/2012   Procedure: CYSTOSCOPY WITH INJECTION;  Surgeon: Reece Packer, MD;  Location: [facility];  Service: Urology;  Laterality: N/A;  Marcaine and Pyridium  . PILONIDAL CYST / SINUS EXCISION  1980's   "was wrapped around my spinal cord; Dr. Norville Haggard got almost all of it except a tiny bit; said it was really deep; packed fatty tissue in the hole; really bothers me alot"  . SALPINGOOPHORECTOMY  1980's   "2 surgeries; 1st right; then left"  . TMJ ARTHROPLASTY  1980's   right side; "I've had to have it twice"  . VESICOVAGINAL FISTULA CLOSURE W/ TAH  1983    Social History   Tobacco Use  Smoking Status Former Smoker  . Packs/day: 0.25  . Years: 15.00  . Pack years: 3.75  . Types: Cigarettes  Smokeless Tobacco Never Used    Social History   Substance and Sexual Activity  Alcohol Use No    Family History  Problem Relation Age of Onset  . Emphysema Mother        smoked  . Asthma Mother   . Breast cancer Mother   . Heart disease Father   . Heart disease Paternal Grandfather     Review of Systems: The review of systems is as noted in HPI.   All other systems were reviewed and are negative.  Physical Exam: BP 118/80   Pulse 71   Ht 5' (1.524 m)   Wt 160 lb 12.8 oz (72.9 kg)   BMI 31.40 kg/m  GENERAL:  Well appearing  WF in NAD HEENT:  PERRL, EOMI, sclera are clear. Oropharynx is clear. NECK:  No jugular venous distention, carotid upstroke brisk and symmetric, no bruits, no thyromegaly or adenopathy LUNGS:  Clear to auscultation bilaterally CHEST:  Unremarkable HEART:  RRR,  PMI not displaced or sustained,S1 and S2 within normal limits, no S3, no S4: no clicks, no rubs, no murmurs ABD:  Soft, nontender. BS +, no masses or bruits. No hepatomegaly, no splenomegaly EXT:  2 + pulses throughout, no edema, no cyanosis  no clubbing SKIN:  Warm and dry.  No rashes NEURO:  Alert and oriented x 3. Cranial nerves II through XII intact. PSYCH:  Cognitively intact         LABORATORY DATA:   Lab Results  Component Value Date   WBC 9.9 03/27/2018   HGB 15.0 03/27/2018   HCT 43.7 03/27/2018   PLT 269.0 03/27/2018   GLUCOSE 105 (H) 03/27/2018   CHOL 150 03/27/2018   TRIG 303.0 (H) 03/27/2018   HDL 31.90 (L) 03/27/2018   LDLDIRECT 91.0 03/27/2018   LDLCALC 73 05/02/2017   ALT 10 03/27/2018   AST 12 03/27/2018   NA 138 03/27/2018   K 4.4 03/27/2018   CL 104 03/27/2018   CREATININE 0.72 03/27/2018   BUN 13 03/27/2018   CO2 30 03/27/2018   TSH 3.22 03/27/2018   INR 1.07 09/25/2011   HGBA1C 5.8 11/05/2016   Ecg today shows NSR with normal Ecg. I have personally reviewed and interpreted this study.  Assessment / Plan: 1. Coronary disease with previous bypass and multiple interventions as noted above. She has chronic and atypical chest pain.  Her symptoms don't really sound angina. We discussed follow up stress testing but she does not want to pursue this. Will let me know if symptoms change  Continue  Plavix, amlodipine, Toprol XL and Crestor.  2. Hypertension, well controlled on medication.  3. Tobacco abuse.  Encourage complete cessation.   4. Hyperlipidemia. LDL well controlled on Crestor. Elevated triglycerides. Now on fenofibrate.  5. COPD  6. Hypothyroidism. Followed by primary care.

## 2018-05-21 ENCOUNTER — Encounter: Payer: Self-pay | Admitting: Cardiology

## 2018-05-21 ENCOUNTER — Ambulatory Visit (INDEPENDENT_AMBULATORY_CARE_PROVIDER_SITE_OTHER): Payer: Medicare Other | Admitting: Cardiology

## 2018-05-21 VITALS — BP 118/80 | HR 71 | Ht 60.0 in | Wt 160.8 lb

## 2018-05-21 DIAGNOSIS — E7800 Pure hypercholesterolemia, unspecified: Secondary | ICD-10-CM | POA: Diagnosis not present

## 2018-05-21 DIAGNOSIS — Z720 Tobacco use: Secondary | ICD-10-CM

## 2018-05-21 DIAGNOSIS — I25708 Atherosclerosis of coronary artery bypass graft(s), unspecified, with other forms of angina pectoris: Secondary | ICD-10-CM | POA: Diagnosis not present

## 2018-05-21 DIAGNOSIS — I10 Essential (primary) hypertension: Secondary | ICD-10-CM

## 2018-05-21 MED ORDER — NITROGLYCERIN 0.4 MG SL SUBL: 0.4000 mg | SUBLINGUAL_TABLET | SUBLINGUAL | 3 refills | Status: DC | PRN

## 2018-05-25 ENCOUNTER — Other Ambulatory Visit: Payer: Self-pay | Admitting: Cardiology

## 2018-06-23 ENCOUNTER — Other Ambulatory Visit: Payer: Self-pay | Admitting: Cardiology

## 2018-06-23 ENCOUNTER — Other Ambulatory Visit: Payer: Self-pay | Admitting: Internal Medicine

## 2018-06-23 DIAGNOSIS — E7800 Pure hypercholesterolemia, unspecified: Secondary | ICD-10-CM

## 2018-07-10 ENCOUNTER — Other Ambulatory Visit: Payer: Medicare Other

## 2018-07-23 ENCOUNTER — Other Ambulatory Visit: Payer: Self-pay | Admitting: Cardiology

## 2018-07-29 ENCOUNTER — Other Ambulatory Visit (INDEPENDENT_AMBULATORY_CARE_PROVIDER_SITE_OTHER): Payer: Medicare Other

## 2018-07-29 DIAGNOSIS — E559 Vitamin D deficiency, unspecified: Secondary | ICD-10-CM

## 2018-07-29 DIAGNOSIS — E7800 Pure hypercholesterolemia, unspecified: Secondary | ICD-10-CM | POA: Diagnosis not present

## 2018-07-29 LAB — LIPID PANEL
Cholesterol: 159 mg/dL (ref 0–200)
HDL: 29.3 mg/dL — ABNORMAL LOW (ref >39.00)
NonHDL: 129.94
TRIGLYCERIDES: 228 mg/dL — AB (ref 0.0–149.0)
Total CHOL/HDL Ratio: 5
VLDL: 45.6 mg/dL — ABNORMAL HIGH (ref 0.0–40.0)

## 2018-07-29 LAB — VITAMIN D 25 HYDROXY (VIT D DEFICIENCY, FRACTURES): VITD: 16.82 ng/mL — ABNORMAL LOW (ref 30.00–100.00)

## 2018-07-29 LAB — LDL CHOLESTEROL, DIRECT: Direct LDL: 100 mg/dL

## 2018-07-30 ENCOUNTER — Other Ambulatory Visit: Payer: Self-pay | Admitting: Internal Medicine

## 2018-07-30 DIAGNOSIS — E782 Mixed hyperlipidemia: Secondary | ICD-10-CM

## 2018-07-30 DIAGNOSIS — E559 Vitamin D deficiency, unspecified: Secondary | ICD-10-CM

## 2018-07-30 MED ORDER — VITAMIN D (ERGOCALCIFEROL) 1.25 MG (50000 UNIT) PO CAPS: 50000.0000 [IU] | ORAL_CAPSULE | ORAL | 0 refills | Status: DC

## 2018-07-30 MED ORDER — FENOFIBRATE 67 MG PO CAPS: 67.0000 mg | ORAL_CAPSULE | Freq: Every day | ORAL | 0 refills | Status: DC

## 2018-09-29 ENCOUNTER — Other Ambulatory Visit: Payer: Self-pay | Admitting: Internal Medicine

## 2018-09-29 DIAGNOSIS — E039 Hypothyroidism, unspecified: Secondary | ICD-10-CM

## 2018-10-16 ENCOUNTER — Telehealth: Payer: Self-pay

## 2018-10-16 NOTE — Telephone Encounter (Signed)
Starting on 09/25/18 pt began with pain and burning upon urination,frequency and feeling of urgency also.low back pain on rt side and both legs ache (legs usually ache with fever). No abd pain.Temp 99.5. Yesterday also started with diarrhea, no mucus or blood. Pt drinking more water today. Pt request abx to [facility] [address]. Pt said does not have transportation and does not have any one to bring urine to [facility]. Daughter goes to work at 5 AM and gets off at [employer]. Please advise. Pt did not want to schedule virtual visit but wanted note sent to Avie Echevaria NP.

## 2018-10-16 NOTE — Telephone Encounter (Signed)
Left message on voicemail Nothing ill be prescribed w/o being able to have some type of evaluation... pt needs to schedule video visit

## 2018-10-16 NOTE — Telephone Encounter (Signed)
She needs to set up a virtual visit.

## 2018-10-22 ENCOUNTER — Other Ambulatory Visit: Payer: Self-pay | Admitting: Cardiology

## 2018-10-22 NOTE — Telephone Encounter (Signed)
Clopidogrel refilled

## 2018-11-21 ENCOUNTER — Other Ambulatory Visit: Payer: Self-pay | Admitting: Internal Medicine

## 2018-12-21 ENCOUNTER — Other Ambulatory Visit: Payer: Self-pay | Admitting: Internal Medicine

## 2018-12-21 DIAGNOSIS — E039 Hypothyroidism, unspecified: Secondary | ICD-10-CM

## 2019-01-03 ENCOUNTER — Other Ambulatory Visit: Payer: Self-pay | Admitting: Internal Medicine

## 2019-01-03 DIAGNOSIS — E782 Mixed hyperlipidemia: Secondary | ICD-10-CM

## 2019-01-20 ENCOUNTER — Other Ambulatory Visit: Payer: Self-pay | Admitting: Cardiology

## 2019-02-19 ENCOUNTER — Other Ambulatory Visit: Payer: Self-pay | Admitting: Internal Medicine

## 2019-03-23 ENCOUNTER — Other Ambulatory Visit: Payer: Self-pay | Admitting: Internal Medicine

## 2019-03-23 DIAGNOSIS — E039 Hypothyroidism, unspecified: Secondary | ICD-10-CM

## 2019-05-19 ENCOUNTER — Other Ambulatory Visit: Payer: Self-pay | Admitting: Internal Medicine

## 2019-05-19 DIAGNOSIS — E782 Mixed hyperlipidemia: Secondary | ICD-10-CM

## 2019-06-23 ENCOUNTER — Ambulatory Visit (HOSPITAL_COMMUNITY)
Admission: EM | Admit: 2019-06-23 | Discharge: 2019-06-23 | Disposition: A | Discharge status: Home / Self Care | Payer: Medicare Other | Attending: Urgent Care | Admitting: Urgent Care

## 2019-06-23 ENCOUNTER — Encounter (HOSPITAL_COMMUNITY): Payer: Self-pay | Admitting: Emergency Medicine

## 2019-06-23 ENCOUNTER — Other Ambulatory Visit: Payer: Self-pay

## 2019-06-23 DIAGNOSIS — Z9889 Other specified postprocedural states: Secondary | ICD-10-CM | POA: Diagnosis not present

## 2019-06-23 DIAGNOSIS — Z8739 Personal history of other diseases of the musculoskeletal system and connective tissue: Secondary | ICD-10-CM

## 2019-06-23 DIAGNOSIS — R519 Headache, unspecified: Secondary | ICD-10-CM | POA: Diagnosis not present

## 2019-06-23 DIAGNOSIS — R6884 Jaw pain: Secondary | ICD-10-CM

## 2019-06-23 LAB — SEDIMENTATION RATE: Sed Rate: 2 mm/hr (ref 0–22)

## 2019-06-23 MED ORDER — TRAMADOL HCL 50 MG PO TABS: 50.0000 mg | ORAL_TABLET | Freq: Four times a day (QID) | ORAL | 0 refills | Status: DC | PRN

## 2019-06-23 NOTE — ED Triage Notes (Signed)
TMJ surgery x 2.  Patient reports significant pain and some swelling noted yesterday.

## 2019-06-23 NOTE — Discharge Instructions (Signed)
You may take 500mg -650mg  Tylenol every 6 hours for pain. If this does not help with your pain, you can take 1,000mg  of Tylenol once every 8 hours or use Tramadol.  If Tylenol is not controlling your pain, then using tramadol every 6-8 hours is appropriate. Once your pain is better, go back to Tylenol. Do not use Tramadol if you don't need it.

## 2019-06-23 NOTE — ED Provider Notes (Signed)
[facility]   MRN: [id number] DOB: 1955-11-03  Subjective:   [name] is a 63 y.o. female presenting for 1 day history of acute onset recurrent moderate to severe sharp mostly constant right jaw pain with swelling that radiates down into her neck and at times into her temporal area.  She has used Tylenol with mild to moderate relief.  However, she is concerned about having a recurrence of her TMJ pain.  She is status post 2 TMJ arthroplasties, last one was about 30 years ago.  She generally has not had issues with this until yesterday.  Symptoms started from chewing a hard chip.  Denies fever, sinus pain, runny or stuffy nose, ear pain, ear drainage, sore throat, cough.  Patient admits longstanding history of dental issues including gingivitis but has not made a priority to have her teeth work done by Pharmacist, community.  No current facility-administered medications for this encounter.  Current Outpatient Medications:  .  amLODipine (NORVASC) 5 MG tablet, TAKE 1 TABLET BY MOUTH DAILY, Disp: 90 tablet, Rfl: 3 .  buPROPion (WELLBUTRIN SR) 150 MG 12 hr tablet, TAKE 1 TABLET BY MOUTH TWICE DAILY, Disp: 180 tablet, Rfl: 2 .  clopidogrel (PLAVIX) 75 MG tablet, TAKE 1 TABLET BY MOUTH DAILY, Disp: 90 tablet, Rfl: 2 .  DEXILANT 30 MG capsule, TAKE ONE CAPSULE BY MOUTH ONCE DAILY, Disp: 30 capsule, Rfl: 2 .  diphenhydramine-acetaminophen (TYLENOL PM) 25-500 MG TABS, Take 1 tablet by mouth at bedtime as needed (pain, sleep)., Disp: , Rfl:  .  fenofibrate micronized (LOFIBRA) 67 MG capsule, Take 1 capsule (67 mg total) by mouth daily before breakfast. MUST SCHEDULE PHYSICAL, Disp: 30 capsule, Rfl: 0 .  levothyroxine (SYNTHROID) 75 MCG tablet, Take 1 tablet (75 mcg total) by mouth daily. MUST SCHEDULE PHYSICAL, Disp: 90 tablet, Rfl: 0 .  metoprolol succinate (TOPROL-XL) 50 MG 24 hr tablet, TAKE 1 TABLET BY MOUTH EVERY DAY WITH OR IMMEDIATELY FOLLOWING A MEAL, Disp: 90 tablet, Rfl: 1 .  rosuvastatin  (CRESTOR) 20 MG tablet, TAKE 1 TABLET BY MOUTH ONCE DAILY, Disp: 90 tablet, Rfl: 1 .  nitroGLYCERIN (NITROSTAT) 0.4 MG SL tablet, Place 1 tablet (0.4 mg total) under the tongue every 5 (five) minutes as needed for chest pain., Disp: 90 tablet, Rfl: 3 .  Vitamin D, Ergocalciferol, (DRISDOL) 1.25 MG (50000 UT) CAPS capsule, Take 1 capsule (50,000 Units total) by mouth every 7 (seven) days., Disp: 12 capsule, Rfl: 0  Facility-Administered Medications Ordered in Other Encounters:  .  bupivacaine (MARCAINE) 0.5 % 15 mL, phenazopyridine (PYRIDIUM) 400 mg bladder mixture, , Bladder Instillation, Once, Bjorn Loser, MD   Allergies  Allergen Reactions  . Ranitidine Hives  . Cimetidine Hives    Pt reported  . Sulfamethoxazole-Trimethoprim     Patient couldn't give specific allergy  . Sulfa Antibiotics Other (See Comments)    Childhood reaction    Past Medical History:  Diagnosis Date  . Anemia   . Anxiety   . Blood transfusion   . Chest pain, cardiac    Has chronic chest pain.   . Complication of anesthesia    "my blood pressure will drop out on me"  . Coronary artery disease    a) s/p CABG x 2 in [place] 2002. b)stenting to the SVG-OM in 2009. c)DES to the saphenous vein graft to the ramus in March 2012. d) s/p PTCA/DES to native ramus intermedius in September 2012. Last cath 03/2011 with minimal nonobstructive disease  e) indeterminant myoview 09/2011  but atypical CP & no perfusion defect in ramus intermediate distribution, EF 52%  . Depression   . 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 Enterocolitis   . Gastroparesis   . GERD (gastroesophageal reflux disease)   . GI bleeding   . Hematuria 09/25/11   "lately"  . Hx: UTI (urinary tract infection)    multiple  . Hyperlipidemia   . Hypertension   . Hypothyroidism   . Migraines    h/o  . PTSD (post-traumatic stress disorder)   . Tobacco abuse      Past Surgical History:  Procedure Laterality Date  .  ABDOMINAL HYSTERECTOMY  1980's  . BIOPSY STOMACH  09/25/11   "several in the last year; all negative; via endoscopy"  . BREAST SURGERY     right biopsy  . CHOLECYSTECTOMY  2000's  . CORONARY ANGIOPLASTY WITH STENT PLACEMENT     "lots; they've all collapsed; last time dr took native vein &  turned it around & put stents in it before attaching"  . CORONARY ARTERY BYPASS GRAFT  2002   CABG X2  . CYSTO WITH HYDRODISTENSION  03/28/2012   Procedure: CYSTOSCOPY/HYDRODISTENSION;  Surgeon: Reece Packer, MD;  Location: [facility];  Service: Urology;  Laterality: N/A;  . CYSTOSCOPY W/ RETROGRADES  03/28/2012   Procedure: CYSTOSCOPY WITH RETROGRADE PYELOGRAM;  Surgeon: Reece Packer, MD;  Location: [facility];  Service: Urology;  Laterality: Bilateral;  . CYSTOSCOPY WITH INJECTION  03/28/2012   Procedure: CYSTOSCOPY WITH INJECTION;  Surgeon: Reece Packer, MD;  Location: [facility];  Service: Urology;  Laterality: N/A;  Marcaine and Pyridium  . PILONIDAL CYST / SINUS EXCISION  1980's   "was wrapped around my spinal cord; Dr. Norville Haggard got almost all of it except a tiny bit; said it was really deep; packed fatty tissue in the hole; really bothers me alot"  . SALPINGOOPHORECTOMY  1980's   "2 surgeries; 1st right; then left"  . TMJ ARTHROPLASTY  1980's   right side; "I've had to have it twice"  . VESICOVAGINAL FISTULA CLOSURE W/ TAH  1983    Family History  Problem Relation Age of Onset  . Emphysema Mother        smoked  . Asthma Mother   . Breast cancer Mother   . Heart disease Father   . Heart disease Paternal Grandfather     Social History   Tobacco Use  . Smoking status: Current Every Day Smoker    Packs/day: 0.25    Years: 15.00    Pack years: 3.75    Types: Cigarettes  . Smokeless tobacco: Never Used  Substance Use Topics  . Alcohol use: No  . Drug use: Not Currently    Types: Marijuana    Comment: "as a teen"    ROS   Objective:   Vitals: BP 131/83 (BP Location: Right  Arm)   Pulse 85   Temp 98.8 F (37.1 C) (Oral)   Resp 20   SpO2 98%   Physical Exam Constitutional:      General: She is not in acute distress.    Appearance: Normal appearance. She is well-developed. She is not ill-appearing, toxic-appearing or diaphoretic.  HENT:     Head: Normocephalic and atraumatic.     Jaw: Tenderness (over area outlined), swelling (trace) and pain on movement present.     Salivary Glands: Right salivary gland is not diffusely enlarged or tender. Left salivary gland is  not diffusely enlarged or tender.      Right Ear: Tympanic membrane and ear canal normal. No drainage or tenderness. No middle ear effusion. Tympanic membrane is not erythematous.     Left Ear: Tympanic membrane and ear canal normal. No drainage or tenderness.  No middle ear effusion. Tympanic membrane is not erythematous.     Nose: Nose normal. No congestion or rhinorrhea.     Mouth/Throat:     Mouth: Mucous membranes are moist. No oral lesions.     Pharynx: No pharyngeal swelling, oropharyngeal exudate, posterior oropharyngeal erythema or uvula swelling.     Tonsils: No tonsillar exudate or tonsillar abscesses.     Comments: Poor dentition, multiple caries over molars with gingival recession and mild erythema.  No distinct areas of dental abscess. Eyes:     General: No scleral icterus.       Right eye: No discharge.        Left eye: No discharge.     Extraocular Movements: Extraocular movements intact.     Right eye: Normal extraocular motion.     Left eye: Normal extraocular motion.     Conjunctiva/sclera: Conjunctivae normal.     Pupils: Pupils are equal, round, and reactive to light.  Cardiovascular:     Rate and Rhythm: Normal rate.  Pulmonary:     Effort: Pulmonary effort is normal.  Musculoskeletal:     Cervical back: Normal range of motion and neck supple.  Lymphadenopathy:     Cervical: No cervical adenopathy.  Skin:    General: Skin is warm and dry.  Neurological:      General: No focal deficit present.     Mental Status: She is alert and oriented to person, place, and time.  Psychiatric:        Mood and Affect: Mood normal.        Behavior: Behavior normal.        Thought Content: Thought content normal.        Judgment: Judgment normal.     Assessment and Plan :   1. Jaw pain   2. Right facial pain   3. History of TMJ disorder   4. 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  Discussed differential including trigeminal neuralgia, dental infection, parotitis, GCA all of which I suspect are lower likelihood given her extensive history.  Will recommend pain control with scheduled Tylenol, tramadol for breakthrough pain.  Patient reports this is helped her significantly in the past.  Will refer her to [facility] for consult on her jaw pain. Counseled patient on potential for adverse effects with medications prescribed/recommended today, ER and return-to-clinic precautions discussed, patient verbalized understanding.    Jaynee Eagles, [place] 06/23/19 1812

## 2019-06-30 ENCOUNTER — Other Ambulatory Visit: Payer: Self-pay | Admitting: Cardiology

## 2019-07-02 NOTE — Telephone Encounter (Signed)
We can renew amlodipine but her Wellbutrin should be filled by primary care.  Azahel Belcastro [place] MD, [facility]

## 2019-07-03 ENCOUNTER — Other Ambulatory Visit: Payer: Self-pay

## 2019-07-03 MED ORDER — NITROGLYCERIN 0.4 MG SL SUBL: 0.4000 mg | SUBLINGUAL_TABLET | SUBLINGUAL | 3 refills | Status: DC | PRN

## 2019-08-19 ENCOUNTER — Other Ambulatory Visit: Payer: Self-pay | Admitting: Internal Medicine

## 2019-08-19 DIAGNOSIS — E039 Hypothyroidism, unspecified: Secondary | ICD-10-CM

## 2019-08-19 DIAGNOSIS — E782 Mixed hyperlipidemia: Secondary | ICD-10-CM

## 2019-08-20 ENCOUNTER — Other Ambulatory Visit: Payer: Self-pay | Admitting: Internal Medicine

## 2019-08-20 DIAGNOSIS — E039 Hypothyroidism, unspecified: Secondary | ICD-10-CM

## 2019-08-20 DIAGNOSIS — E782 Mixed hyperlipidemia: Secondary | ICD-10-CM

## 2019-08-28 ENCOUNTER — Other Ambulatory Visit: Payer: Self-pay | Admitting: Internal Medicine

## 2019-08-28 DIAGNOSIS — E039 Hypothyroidism, unspecified: Secondary | ICD-10-CM

## 2019-08-28 DIAGNOSIS — E782 Mixed hyperlipidemia: Secondary | ICD-10-CM

## 2019-09-16 ENCOUNTER — Other Ambulatory Visit: Payer: Self-pay | Admitting: Internal Medicine

## 2019-09-25 ENCOUNTER — Other Ambulatory Visit: Payer: Self-pay

## 2019-09-25 MED ORDER — ROSUVASTATIN CALCIUM 20 MG PO TABS: 20.0000 mg | ORAL_TABLET | Freq: Every day | ORAL | 0 refills | Status: DC

## 2019-10-15 DIAGNOSIS — Z03818 Encounter for observation for suspected exposure to other biological agents ruled out: Secondary | ICD-10-CM | POA: Diagnosis not present

## 2019-10-16 ENCOUNTER — Other Ambulatory Visit: Payer: Self-pay | Admitting: Internal Medicine

## 2019-10-16 DIAGNOSIS — E782 Mixed hyperlipidemia: Secondary | ICD-10-CM

## 2019-10-22 ENCOUNTER — Other Ambulatory Visit: Payer: Self-pay | Admitting: Internal Medicine

## 2019-11-30 ENCOUNTER — Other Ambulatory Visit: Payer: Self-pay | Admitting: Internal Medicine

## 2019-11-30 DIAGNOSIS — E039 Hypothyroidism, unspecified: Secondary | ICD-10-CM

## 2019-12-17 ENCOUNTER — Telehealth: Payer: Self-pay | Admitting: Internal Medicine

## 2019-12-17 ENCOUNTER — Other Ambulatory Visit: Payer: Self-pay | Admitting: Cardiology

## 2019-12-17 DIAGNOSIS — E782 Mixed hyperlipidemia: Secondary | ICD-10-CM

## 2019-12-22 ENCOUNTER — Other Ambulatory Visit: Payer: Self-pay | Admitting: Internal Medicine

## 2019-12-22 DIAGNOSIS — E039 Hypothyroidism, unspecified: Secondary | ICD-10-CM

## 2019-12-22 DIAGNOSIS — E782 Mixed hyperlipidemia: Secondary | ICD-10-CM

## 2019-12-22 MED ORDER — DEXILANT 30 MG PO CPDR: 1.0000 | DELAYED_RELEASE_CAPSULE | Freq: Every day | ORAL | 1 refills | Status: DC

## 2019-12-22 MED ORDER — FENOFIBRATE 67 MG PO CAPS: ORAL_CAPSULE | ORAL | 1 refills | Status: DC

## 2019-12-22 NOTE — Telephone Encounter (Signed)
Both meds refilled until her appt

## 2019-12-22 NOTE — Addendum Note (Signed)
Addended by: Jearld Fenton on: 12/22/2019 07:12 PM   Modules accepted: Orders

## 2019-12-22 NOTE — Telephone Encounter (Signed)
Patient called about refill request.  Advised she needed to schedule her annual physical. She is scheduled 8/12 and would like to know what she should do about her medication. She is completely out and stated she can tell she has been without the medication

## 2019-12-23 NOTE — Telephone Encounter (Signed)
Rx sent through [facility]  

## 2019-12-23 NOTE — Telephone Encounter (Signed)
Patient called back.  She said she's been out of her Levothyroxine for 2 weeks.  Please call in refill to [facility].

## 2019-12-25 ENCOUNTER — Other Ambulatory Visit: Payer: Self-pay | Admitting: Internal Medicine

## 2019-12-25 DIAGNOSIS — E782 Mixed hyperlipidemia: Secondary | ICD-10-CM

## 2020-01-17 ENCOUNTER — Other Ambulatory Visit: Payer: Self-pay | Admitting: Internal Medicine

## 2020-01-17 DIAGNOSIS — E782 Mixed hyperlipidemia: Secondary | ICD-10-CM

## 2020-02-04 ENCOUNTER — Encounter: Payer: Self-pay | Admitting: Internal Medicine

## 2020-02-04 ENCOUNTER — Ambulatory Visit (INDEPENDENT_AMBULATORY_CARE_PROVIDER_SITE_OTHER): Payer: Medicare Other | Admitting: Internal Medicine

## 2020-02-04 ENCOUNTER — Other Ambulatory Visit: Payer: Self-pay

## 2020-02-04 VITALS — BP 118/80 | HR 99 | Temp 98.1°F | Ht 59.75 in | Wt 169.0 lb

## 2020-02-04 DIAGNOSIS — E039 Hypothyroidism, unspecified: Secondary | ICD-10-CM | POA: Diagnosis not present

## 2020-02-04 DIAGNOSIS — I10 Essential (primary) hypertension: Secondary | ICD-10-CM | POA: Diagnosis not present

## 2020-02-04 DIAGNOSIS — F329 Major depressive disorder, single episode, unspecified: Secondary | ICD-10-CM

## 2020-02-04 DIAGNOSIS — K580 Irritable bowel syndrome with diarrhea: Secondary | ICD-10-CM

## 2020-02-04 DIAGNOSIS — E782 Mixed hyperlipidemia: Secondary | ICD-10-CM

## 2020-02-04 DIAGNOSIS — F419 Anxiety disorder, unspecified: Secondary | ICD-10-CM

## 2020-02-04 DIAGNOSIS — Z0000 Encounter for general adult medical examination without abnormal findings: Secondary | ICD-10-CM | POA: Diagnosis not present

## 2020-02-04 DIAGNOSIS — K219 Gastro-esophageal reflux disease without esophagitis: Secondary | ICD-10-CM

## 2020-02-04 DIAGNOSIS — R6889 Other general symptoms and signs: Secondary | ICD-10-CM | POA: Diagnosis not present

## 2020-02-04 DIAGNOSIS — J449 Chronic obstructive pulmonary disease, unspecified: Secondary | ICD-10-CM | POA: Diagnosis not present

## 2020-02-04 DIAGNOSIS — I25708 Atherosclerosis of coronary artery bypass graft(s), unspecified, with other forms of angina pectoris: Secondary | ICD-10-CM

## 2020-02-04 DIAGNOSIS — F32A Depression, unspecified: Secondary | ICD-10-CM

## 2020-02-04 DIAGNOSIS — Z1211 Encounter for screening for malignant neoplasm of colon: Secondary | ICD-10-CM | POA: Diagnosis not present

## 2020-02-04 DIAGNOSIS — Z862 Personal history of diseases of the blood and blood-forming organs and certain disorders involving the immune mechanism: Secondary | ICD-10-CM

## 2020-02-04 MED ORDER — VENLAFAXINE HCL ER 37.5 MG PO CP24: 37.5000 mg | ORAL_CAPSULE | Freq: Every day | ORAL | 1 refills | Status: DC

## 2020-02-04 NOTE — Assessment & Plan Note (Signed)
C met and lipid profile today Encouraged her to consume a low-fat diet Continue Rosuvastatin

## 2020-02-04 NOTE — Patient Instructions (Signed)

## 2020-02-04 NOTE — Assessment & Plan Note (Signed)
TSH and free T4 today We will adjust Levothyroxine if needed based on labs 

## 2020-02-04 NOTE — Assessment & Plan Note (Signed)
Encourage smoking cessation

## 2020-02-04 NOTE — Assessment & Plan Note (Signed)
Deteriorated secondary to pandemic stress Rx for Effexor 37.5 mg p.o. nightly Support offered today  Update me in 1 month and let me know how you are doing

## 2020-02-04 NOTE — Progress Notes (Signed)
HPI:  Patient presents the clinic today for her subsequent annual Medicare wellness exam.  She is also due to follow-up chronic conditions.  History of Anemia: Her last H/H was 15/43.7, 03/2018.  She denies signs and symptoms of bleeding.  She is not currently taking an iron supplement OTC.  Anxiety and Depression: Triggered by general life stress, PTSD.  She is no longer taking Wellbutrin. She has taken Venlafaxine in the past and would like to get started back on this. She is not currently seeing a therapist.  She denies SI/HI.  HLD with CAD status post CABG with stents: Intermittent angina.  Her last LDL was 100, triglycerides 228, 07/2018.  She is taking Plavix, Metoprolol and Rosuvastatin as prescribed.  ECG from 04/2018 reviewed.  GERD with Gastroparesis: She denies breakthrough on Dexlansoprazole.  There is no upper GI on file.  COPD: She denies chronic cough or shortness of breath.  She is not using any inhalers at this time.  She does continue to smoke.  PFTs from 08/2011 reviewed.  IBS: Mainly diarrhea.  She manages this with diet.  She is not following with GI.  HTN: Her BP today is 118/80.  She is taking Amlodipine and Metoprolol as prescribed.  ECG from 04/2018 reviewed.  Hypothyroidism: She denies any issues on her current dose of Levothyroxine.  She is not following with endocrinology.  Past Medical History:  Diagnosis Date  . Anemia   . Anxiety   . Blood transfusion   . Chest pain, cardiac    Has chronic chest pain.   . Complication of anesthesia    "my blood pressure will drop out on me"  . Coronary artery disease    a) s/p CABG x 2 in [place] 2002. b)stenting to the SVG-OM in 2009. c)DES to the saphenous vein graft to the ramus in March 2012. d) s/p PTCA/DES to native ramus intermedius in September 2012. Last cath 03/2011 with minimal nonobstructive disease  e) indeterminant myoview 09/2011 but atypical CP & no perfusion defect in ramus intermediate distribution, EF 52%   . Depression   . 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 Enterocolitis   . Gastroparesis   . GERD (gastroesophageal reflux disease)   . GI bleeding   . Hematuria 09/25/11   "lately"  . Hx: UTI (urinary tract infection)    multiple  . Hyperlipidemia   . Hypertension   . Hypothyroidism   . Migraines    h/o  . PTSD (post-traumatic stress disorder)   . Tobacco abuse     Current Outpatient Medications  Medication Sig Dispense Refill  . amLODipine (NORVASC) 5 MG tablet TAKE 1 TABLET BY MOUTH DAILY 90 tablet 3  . buPROPion (WELLBUTRIN SR) 150 MG 12 hr tablet TAKE 1 TABLET BY MOUTH TWICE DAILY 180 tablet 2  . clopidogrel (PLAVIX) 75 MG tablet Take 1 tablet (75 mg total) by mouth daily. NEED OV. 90 tablet 0  . Dexlansoprazole (DEXILANT) 30 MG capsule Take 1 capsule (30 mg total) by mouth daily. MUST SCHEDULE PHYSICAL EXAM 30 capsule 1  . diphenhydramine-acetaminophen (TYLENOL PM) 25-500 MG TABS Take 1 tablet by mouth at bedtime as needed (pain, sleep).    . fenofibrate micronized (LOFIBRA) 67 MG capsule TAKE 1 CAPSULE BY MOUTH DAILY BEFORE BREAKFAST 90 capsule 0  . levothyroxine (SYNTHROID) 75 MCG tablet TAKE 1 TABLET BY MOUTH EVERY DAY 30 tablet 1  . metoprolol succinate (TOPROL-XL) 50 MG 24 hr tablet TAKE  1 TABLET BY MOUTH EVERY DAY WITH OR IMMEDIATELY FOLLOWING A MEAL 90 tablet 1  . nitroGLYCERIN (NITROSTAT) 0.4 MG SL tablet Place 1 tablet (0.4 mg total) under the tongue every 5 (five) minutes as needed for chest pain. 90 tablet 3  . rosuvastatin (CRESTOR) 20 MG tablet Take 1 tablet (20 mg total) by mouth daily. 90 tablet 0  . traMADol (ULTRAM) 50 MG tablet Take 1 tablet (50 mg total) by mouth every 6 (six) hours as needed for severe pain. 20 tablet 0  . Vitamin D, Ergocalciferol, (DRISDOL) 1.25 MG (50000 UT) CAPS capsule Take 1 capsule (50,000 Units total) by mouth every 7 (seven) days. 12 capsule 0   No current facility-administered medications for this visit.    Facility-Administered Medications Ordered in Other Visits  Medication Dose Route Frequency Provider Last Rate Last Admin  . bupivacaine (MARCAINE) 0.5 % 15 mL, phenazopyridine (PYRIDIUM) 400 mg bladder mixture   Bladder Instillation Once Bjorn Loser, MD        Allergies  Allergen Reactions  . Ranitidine Hives  . Cimetidine Hives    Pt reported  . Sulfamethoxazole-Trimethoprim     Patient couldn't give specific allergy  . Sulfa Antibiotics Other (See Comments)    Childhood reaction    Family History  Problem Relation Age of Onset  . Emphysema Mother        smoked  . Asthma Mother   . Breast cancer Mother   . Heart disease Father   . Heart disease Paternal Grandfather     Social History   Socioeconomic History  . Marital status: Widowed    Spouse name: Not on file  . Number of children: 2  . Years of education: Not on file  . Highest education level: Not on file  Occupational History  . Occupation: Disabled  Tobacco Use  . Smoking status: Current Every Day Smoker    Packs/day: 0.25    Years: 15.00    Pack years: 3.75    Types: Cigarettes  . Smokeless tobacco: Never Used  Vaping Use  . Vaping Use: Never used  Substance and Sexual Activity  . Alcohol use: No  . Drug use: Not Currently    Types: Marijuana    Comment: "as a teen"  . Sexual activity: Not Currently  Other Topics Concern  . Not on file  Social History Narrative  . Not on file   Social Determinants of Health   Financial Resource Strain:   . Difficulty of Paying Living Expenses:   Food Insecurity:   . Worried About Charity fundraiser in the Last Year:   . Arboriculturist in the Last Year:   Transportation Needs:   . Film/video editor (Medical):   Marland Kitchen Lack of Transportation (Non-Medical):   Physical Activity:   . Days of Exercise per Week:   . Minutes of Exercise per Session:   Stress:   . Feeling of Stress :   Social Connections:   . Frequency of Communication with Friends  and Family:   . Frequency of Social Gatherings with Friends and Family:   . Attends Religious Services:   . Active Member of Clubs or Organizations:   . Attends Archivist Meetings:   Marland Kitchen Marital Status:   Intimate Partner Violence:   . Fear of Current or Ex-Partner:   . Emotionally Abused:   Marland Kitchen Physically Abused:   . Sexually Abused:     Hospitiliaztions: None  Health Maintenance:  Flu: 03/2019  Tetanus: 05/2012  Pneumovax: 03/2017  Prevnar: 04/2015  Zostavax: never  Shingrix: never  Covid: never  Mammogram: > 5 years ago  Pap Smear: > 5 years ago  Bone Density: never  Colon Screening: 11/2016, Cologuard  Eye Doctor: as needed  Dental Exam: as needed   Providers:   PCP: [name], NP  Cardiologist: Dr. Martinique.     I have personally reviewed and have noted:  1. The patient's medical and social history 2. Their use of alcohol, tobacco or illicit drugs 3. Their current medications and supplements 4. The patient's functional ability including ADL's, fall risks, home safety risks and hearing or visual impairment. 5. Diet and physical activities 6. Evidence for depression or mood disorder  Subjective:   Review of Systems:   Constitutional: Denies fever, malaise, fatigue, headache or abrupt weight changes.  HEENT: Denies eye pain, eye redness, ear pain, ringing in the ears, wax buildup, runny nose, nasal congestion, bloody nose, or sore throat. Respiratory: Denies difficulty breathing, shortness of breath, cough or sputum production.   Cardiovascular: Pt reports intermittent chest pain. Denies chest tightness, palpitations or swelling in the hands or feet.  Gastrointestinal: Pt reports intermittent reflux, diarrhea. Denies abdominal pain, bloating, constipation, or blood in the stool.  GU: Denies urgency, frequency, pain with urination, burning sensation, blood in urine, odor or discharge. Musculoskeletal: Denies decrease in range of motion, difficulty  with gait, muscle pain or joint pain and swelling.  Skin: Denies redness, rashes, lesions or ulcercations.  Neurological: Denies dizziness, difficulty with memory, difficulty with speech or problems with balance and coordination.  Psych: Pt has a history of anxiety and depression. Denies SI/HI.  No other specific complaints in a complete review of systems (except as listed in HPI above).  Objective:  PE:  BP 118/80   Pulse 99   Temp 98.1 F (36.7 C) (Temporal)   Ht 4' 11.75" (1.518 m)   Wt 169 lb (76.7 kg)   SpO2 96%   BMI 33.28 kg/m   Wt Readings from Last 3 Encounters:  05/21/18 160 lb 12.8 oz (72.9 kg)  03/27/18 159 lb (72.1 kg)  11/06/17 162 lb 6.4 oz (73.7 kg)    General: Appears her stated age, obese, in NAD. Skin: Warm, dry and intact. No rashes noted. HEENT: Head: normal shape and size; Eyes: sclera white, no icterus, conjunctiva pink, PERRLA and EOMs intact; Ears: Tm's gray and intact, normal light reflex;  Neck: Neck supple, trachea midline. No masses, lumps or thyromegaly present.  Cardiovascular: Normal rate and rhythm. S1,S2 noted.  No murmur, rubs or gallops noted. No JVD or BLE edema. No carotid bruits noted. Pulmonary/Chest: Normal effort and positive vesicular breath sounds. No respiratory distress. No wheezes, rales or ronchi noted.  Abdomen: Soft and nontender. Normal bowel sounds. No distention or masses noted. Liver, spleen and kidneys non palpable. Musculoskeletal:  Strength 5/5 BUE/BLE. No signs of joint swelling.  Neurological: Alert and oriented. Cranial nerves II-XII grossly intact. Coordination normal.  Psychiatric: Mood and affect normal. Behavior is normal. Judgment and thought content normal.     BMET    Component Value Date/Time   NA 138 03/27/2018 1456   NA 140 05/02/2017 0835   K 4.4 03/27/2018 1456   CL 104 03/27/2018 1456   CO2 30 03/27/2018 1456   GLUCOSE 105 (H) 03/27/2018 1456   BUN 13 03/27/2018 1456   BUN 12 05/02/2017 0835    CREATININE 0.72 03/27/2018 1456   CREATININE 0.72  06/05/2016 0814   CALCIUM 9.8 03/27/2018 1456   GFRNONAA 92 05/02/2017 0835   GFRAA 106 05/02/2017 0835    Lipid Panel     Component Value Date/Time   CHOL 159 07/29/2018 0817   CHOL 155 05/02/2017 0833   TRIG 228.0 (H) 07/29/2018 0817   HDL 29.30 (L) 07/29/2018 0817   HDL 35 (L) 05/02/2017 0833   CHOLHDL 5 07/29/2018 0817   VLDL 45.6 (H) 07/29/2018 0817   LDLCALC 73 05/02/2017 0833    CBC    Component Value Date/Time   WBC 9.9 03/27/2018 1456   RBC 4.72 03/27/2018 1456   HGB 15.0 03/27/2018 1456   HCT 43.7 03/27/2018 1456   PLT 269.0 03/27/2018 1456   MCV 92.6 03/27/2018 1456   MCH 32.0 06/05/2016 0814   MCHC 34.4 03/27/2018 1456   RDW 13.1 03/27/2018 1456   LYMPHSABS 3,128 06/05/2016 0814   MONOABS 460 06/05/2016 0814   EOSABS 184 06/05/2016 0814   BASOSABS 0 06/05/2016 0814    Hgb A1C Lab Results  Component Value Date   HGBA1C 5.8 11/05/2016      Assessment and Plan:   Medicare Annual Wellness Visit:  Diet: She does eat meat. She consumes more veggies than fruits. She tries to avoid fried foods. She drinks mostly coffee, soda, water, tea. Physical activity: Sedentary Depression/mood screen: Negative, PHQ 9 score of 6 Hearing: Intact to whispered voice Visual acuity: Grossly normal, performs annual eye exam  ADLs: Capable Fall risk: None Home safety: Good Cognitive evaluation: Intact to orientation, naming, recall and repetition EOL planning: No adv directives, full code/ I agree  Preventative Medicine: Encouraged her to get a flu shot in the fall. Tetanus UTD. Pneumovax due 2022. She should not have had a Prevnar yet. She will contact her insurance company about a Shingrix vaccine. She declines Covid vaccine at this time. She no longer needs Pap smear due to hysterectomy. She declines mammogram or bone density at this time. Cologuard ordered. Encouraged her to consume a balanced diet and exercise  regimen. Advised her to see an eye doctor and dentist annually. Will check CBC, C met, TSH, free T4, lipid and vitamin D today.  Next appointment: 1 year, Medicare wellness exam.   [name], NP This visit occurred during the SARS-CoV-2 public health emergency.  Safety protocols were in place, including screening questions prior to the visit, additional usage of staff PPE, and extensive cleaning of exam room while observing appropriate contact time as indicated for disinfecting solutions.

## 2020-02-04 NOTE — Assessment & Plan Note (Signed)
CBC today.  

## 2020-02-04 NOTE — Assessment & Plan Note (Signed)
Continue FODMAP diet

## 2020-02-04 NOTE — Assessment & Plan Note (Signed)
CBC and C met today °Continue Dexlansoprazole °We will monitor °

## 2020-02-04 NOTE — Assessment & Plan Note (Signed)
Intermittent angina C met and lipid profile today Encouraged her to consume a low-fat diet, encourage smoking cessation Continue Rosuvastatin, Metoprolol and Plavix

## 2020-02-04 NOTE — Assessment & Plan Note (Signed)
Continue Metoprolol and Amlodipine C met today Reinforced DASH diet

## 2020-02-05 LAB — CBC
HCT: 44.5 % (ref 36.0–46.0)
Hemoglobin: 15.1 g/dL — ABNORMAL HIGH (ref 12.0–15.0)
MCHC: 33.9 g/dL (ref 30.0–36.0)
MCV: 93.5 fl (ref 78.0–100.0)
Platelets: 282 10*3/uL (ref 150.0–400.0)
RBC: 4.76 Mil/uL (ref 3.87–5.11)
RDW: 13.4 % (ref 11.5–15.5)
WBC: 12.6 10*3/uL — ABNORMAL HIGH (ref 4.0–10.5)

## 2020-02-05 LAB — COMPREHENSIVE METABOLIC PANEL
ALT: 23 U/L (ref 0–35)
AST: 18 U/L (ref 0–37)
Albumin: 4.7 g/dL (ref 3.5–5.2)
Alkaline Phosphatase: 57 U/L (ref 39–117)
BUN: 13 mg/dL (ref 6–23)
CO2: 27 mEq/L (ref 19–32)
Calcium: 10.1 mg/dL (ref 8.4–10.5)
Chloride: 103 mEq/L (ref 96–112)
Creatinine, Ser: 0.78 mg/dL (ref 0.40–1.20)
GFR: 74.79 mL/min (ref >60.00)
Glucose, Bld: 92 mg/dL (ref 70–99)
Potassium: 4.7 mEq/L (ref 3.5–5.1)
Sodium: 139 mEq/L (ref 135–145)
Total Bilirubin: 0.3 mg/dL (ref 0.2–1.2)
Total Protein: 7.1 g/dL (ref 6.0–8.3)

## 2020-02-05 LAB — T4, FREE: Free T4: 0.73 ng/dL (ref 0.60–1.60)

## 2020-02-05 LAB — LIPID PANEL
Cholesterol: 154 mg/dL (ref 0–200)
HDL: 35.6 mg/dL — ABNORMAL LOW (ref >39.00)
LDL Cholesterol: 86 mg/dL (ref 0–99)
NonHDL: 118.52
Total CHOL/HDL Ratio: 4
Triglycerides: 162 mg/dL — ABNORMAL HIGH (ref 0.0–149.0)
VLDL: 32.4 mg/dL (ref 0.0–40.0)

## 2020-02-05 LAB — TSH: TSH: 3.58 u[IU]/mL (ref 0.35–4.50)

## 2020-02-05 LAB — VITAMIN D 25 HYDROXY (VIT D DEFICIENCY, FRACTURES): VITD: 44.91 ng/mL (ref 30.00–100.00)

## 2020-02-11 ENCOUNTER — Telehealth: Payer: Self-pay | Admitting: Internal Medicine

## 2020-02-11 NOTE — Telephone Encounter (Signed)
Pt checking on lab results

## 2020-02-15 ENCOUNTER — Other Ambulatory Visit: Payer: Self-pay | Admitting: Internal Medicine

## 2020-02-15 ENCOUNTER — Telehealth: Payer: Self-pay

## 2020-02-15 DIAGNOSIS — E039 Hypothyroidism, unspecified: Secondary | ICD-10-CM

## 2020-02-15 DIAGNOSIS — E782 Mixed hyperlipidemia: Secondary | ICD-10-CM

## 2020-02-15 NOTE — Telephone Encounter (Signed)
Cologuard form has been faxed w/ demographics

## 2020-02-16 MED ORDER — LEVOTHYROXINE SODIUM 75 MCG PO TABS: 75.0000 ug | ORAL_TABLET | Freq: Every day | ORAL | 2 refills | Status: DC

## 2020-02-16 MED ORDER — ROSUVASTATIN CALCIUM 20 MG PO TABS: 20.0000 mg | ORAL_TABLET | Freq: Every day | ORAL | 2 refills | Status: DC

## 2020-02-16 MED ORDER — FENOFIBRATE 67 MG PO CAPS: 67.0000 mg | ORAL_CAPSULE | Freq: Every day | ORAL | 2 refills | Status: DC

## 2020-03-11 ENCOUNTER — Encounter: Payer: Self-pay | Admitting: Internal Medicine

## 2020-03-11 DIAGNOSIS — Z1211 Encounter for screening for malignant neoplasm of colon: Secondary | ICD-10-CM | POA: Diagnosis not present

## 2020-03-11 LAB — COLOGUARD: Cologuard: NEGATIVE

## 2020-03-15 LAB — COLOGUARD: COLOGUARD: NEGATIVE

## 2020-03-16 ENCOUNTER — Other Ambulatory Visit: Payer: Self-pay | Admitting: Cardiology

## 2020-03-24 ENCOUNTER — Telehealth: Payer: Self-pay | Admitting: *Deleted

## 2020-03-24 NOTE — Telephone Encounter (Signed)
Pt called Triage with a few questions. Pt said she got a letter saying her cologuard was complete and her PCP should be calling to f/u with her, pt wanted to know if we have received cologuard back. Don't see it so will send message to assistant also so she is aware.   Also pt said that the venlafaxine PCP prescribed at her CPE isn't working she seems to be more "snappy" and her temper is worse. Pt wants to know what should she do but she doesn't think she should be on effexor.  Pt asked if she could get her flu and covid shot if she is "sick" I asked pt what sxs is she having ? Pt said last week she had a fever of 101 but that has resolved but now she has a really bad cough that's worse at night, she has congestion and wheezing. I asked pt is she was SOB and she said only when she "plays with granddaughter" or doing something. Pt said she has been using mucinex with little relief. I asked pt if she has had a covid test due to sxs. Pt said no because she doesn't have transportation and doesn't think its covid she thinks it's a "cold or sinus". I advise pt given sxs she should get a covid test. Pt said she thinks she will "wait it out" since she has no transportation, but wanted to see what PCP thought of her sxs. Given multiple issues with med and URI sxs I scheduled a virtual appt (30 min) tomorrow at 3:15 to discuss this with PCP

## 2020-03-24 NOTE — Telephone Encounter (Signed)
Will discuss at upcoming appt.

## 2020-03-24 NOTE — Telephone Encounter (Signed)
Cologuard was negative.

## 2020-03-25 ENCOUNTER — Other Ambulatory Visit: Payer: Self-pay

## 2020-03-25 ENCOUNTER — Encounter: Payer: Self-pay | Admitting: Internal Medicine

## 2020-03-25 ENCOUNTER — Telehealth (INDEPENDENT_AMBULATORY_CARE_PROVIDER_SITE_OTHER): Payer: Medicare Other | Admitting: Internal Medicine

## 2020-03-25 VITALS — HR 99 | Temp 99.4°F | Ht 59.0 in | Wt 166.0 lb

## 2020-03-25 DIAGNOSIS — R0989 Other specified symptoms and signs involving the circulatory and respiratory systems: Secondary | ICD-10-CM | POA: Diagnosis not present

## 2020-03-25 DIAGNOSIS — R509 Fever, unspecified: Secondary | ICD-10-CM

## 2020-03-25 DIAGNOSIS — R059 Cough, unspecified: Secondary | ICD-10-CM | POA: Diagnosis not present

## 2020-03-25 DIAGNOSIS — F32A Depression, unspecified: Secondary | ICD-10-CM

## 2020-03-25 DIAGNOSIS — R490 Dysphonia: Secondary | ICD-10-CM | POA: Diagnosis not present

## 2020-03-25 DIAGNOSIS — F419 Anxiety disorder, unspecified: Secondary | ICD-10-CM

## 2020-03-25 MED ORDER — AZITHROMYCIN 250 MG PO TABS: ORAL_TABLET | ORAL | 0 refills | Status: DC

## 2020-03-25 MED ORDER — HYDROCODONE-HOMATROPINE 5-1.5 MG/5ML PO SYRP: 5.0000 mL | ORAL_SOLUTION | Freq: Three times a day (TID) | ORAL | 0 refills | Status: DC | PRN

## 2020-03-25 NOTE — Patient Instructions (Signed)

## 2020-03-25 NOTE — Progress Notes (Signed)
Virtual Visit via Video Note  I connected with [name] on 03/25/20 at  3:15 PM EDT by a video enabled telemedicine application and verified that I am speaking with the correct person using two identifiers.  Location: Patient: Home Provider: Office  Person's participating in this virtual call: [name], NP and [name].   I discussed the limitations of evaluation and management by telemedicine and the availability of in person appointments. The patient expressed understanding and agreed to proceed.  History of Present Illness:  Pt reports fever, runny nose, hoarseness, cough. This started 1 week ago. She is blowing clear mucous out of her nose. The cough is productive of yellow mucous, worse at night. She denies headache, visual changes, nasal congestion, ear pain, loss of taste/smell or SOB. She reports fever up to 101 but denies chills or body aches. She has had sick contacts with similar symptoms but denies exposure to Covid that she is aware of. She has not had her Covid vaccine. She does smoke but has no documented history of COPD.  She reports she is no longer taking the Venlafaxine because it made her "snappy".  She has depression which is chronic but denies anxiety, SI/HI.   She would also like to know the results of her recent Cologuard test.  Past Medical History:  Diagnosis Date  . Anemia   . Anxiety   . Blood transfusion   . Chest pain, cardiac    Has chronic chest pain.   . Complication of anesthesia    "my blood pressure will drop out on me"  . Coronary artery disease    a) s/p CABG x 2 in [place] 2002. b)stenting to the SVG-OM in 2009. c)DES to the saphenous vein graft to the ramus in March 2012. d) s/p PTCA/DES to native ramus intermedius in September 2012. Last cath 03/2011 with minimal nonobstructive disease  e) indeterminant myoview 09/2011 but atypical CP & no perfusion defect in ramus intermediate distribution, EF 52%  . Depression   . Embolism -  blood clot 2002   "in my heart after bypass"  . Enterocolitis   . Gastroparesis   . GERD (gastroesophageal reflux disease)   . GI bleeding   . Hematuria 09/25/11   "lately"  . Hx: UTI (urinary tract infection)    multiple  . Hyperlipidemia   . Hypertension   . Hypothyroidism   . Migraines    h/o  . PTSD (post-traumatic stress disorder)   . Tobacco abuse     Current Outpatient Medications  Medication Sig Dispense Refill  . amLODipine (NORVASC) 5 MG tablet TAKE 1 TABLET BY MOUTH DAILY 90 tablet 3  . Cholecalciferol (VITAMIN D3) 125 MCG (5000 UT) CAPS Take 10,000 Units by mouth.    . clopidogrel (PLAVIX) 75 MG tablet Take 1 tablet (75 mg total) by mouth daily. NEED OV. 90 tablet 0  . DEXILANT 30 MG capsule TAKE 1 CAPSULE(30 MG) BY MOUTH DAILY 90 capsule 2  . diphenhydramine-acetaminophen (TYLENOL PM) 25-500 MG TABS Take 1 tablet by mouth at bedtime as needed (pain, sleep).    . fenofibrate micronized (LOFIBRA) 67 MG capsule Take 1 capsule (67 mg total) by mouth daily before breakfast. 90 capsule 2  . levothyroxine (SYNTHROID) 75 MCG tablet Take 1 tablet (75 mcg total) by mouth daily. 90 tablet 2  . metoprolol succinate (TOPROL-XL) 50 MG 24 hr tablet TAKE 1 TABLET BY MOUTH EVERY DAY WITH OR IMMEDIATELY FOLLOWING A MEAL 90 tablet 0  . Multiple  Vitamin (MULTIVITAMIN) tablet Take 1 tablet by mouth daily.    . nitroGLYCERIN (NITROSTAT) 0.4 MG SL tablet Place 1 tablet (0.4 mg total) under the tongue every 5 (five) minutes as needed for chest pain. 90 tablet 3  . rosuvastatin (CRESTOR) 20 MG tablet Take 1 tablet (20 mg total) by mouth daily. 90 tablet 2  . venlafaxine XR (EFFEXOR XR) 37.5 MG 24 hr capsule Take 1 capsule (37.5 mg total) by mouth daily with breakfast. 30 capsule 1   No current facility-administered medications for this visit.   Facility-Administered Medications Ordered in Other Visits  Medication Dose Route Frequency Provider Last Rate Last Admin  . bupivacaine (MARCAINE) 0.5  % 15 mL, phenazopyridine (PYRIDIUM) 400 mg bladder mixture   Bladder Instillation Once Bjorn Loser, MD        Allergies  Allergen Reactions  . Ranitidine Hives  . Cimetidine Hives    Pt reported  . Sulfamethoxazole-Trimethoprim     Patient couldn't give specific allergy  . Sulfa Antibiotics Other (See Comments)    Childhood reaction    Family History  Problem Relation Age of Onset  . Emphysema Mother        smoked  . Asthma Mother   . Breast cancer Mother   . Heart disease Father   . Heart disease Paternal Grandfather     Social History   Socioeconomic History  . Marital status: Widowed    Spouse name: Not on file  . Number of children: 2  . Years of education: Not on file  . Highest education level: Not on file  Occupational History  . Occupation: Disabled  Tobacco Use  . Smoking status: Current Every Day Smoker    Packs/day: 1.00    Years: 15.00    Pack years: 15.00    Types: Cigarettes  . Smokeless tobacco: Never Used  Vaping Use  . Vaping Use: Never used  Substance and Sexual Activity  . Alcohol use: No  . Drug use: Not Currently    Types: Marijuana    Comment: "as a teen"  . Sexual activity: Not Currently  Other Topics Concern  . Not on file  Social History Narrative  . Not on file   Social Determinants of Health   Financial Resource Strain:   . Difficulty of Paying Living Expenses: Not on file  Food Insecurity:   . Worried About Charity fundraiser in the Last Year: Not on file  . Ran Out of Food in the Last Year: Not on file  Transportation Needs:   . Lack of Transportation (Medical): Not on file  . Lack of Transportation (Non-Medical): Not on file  Physical Activity:   . Days of Exercise per Week: Not on file  . Minutes of Exercise per Session: Not on file  Stress:   . Feeling of Stress : Not on file  Social Connections:   . Frequency of Communication with Friends and Family: Not on file  . Frequency of Social Gatherings with  Friends and Family: Not on file  . Attends Religious Services: Not on file  . Active Member of Clubs or Organizations: Not on file  . Attends Archivist Meetings: Not on file  . Marital Status: Not on file  Intimate Partner Violence:   . Fear of Current or Ex-Partner: Not on file  . Emotionally Abused: Not on file  . Physically Abused: Not on file  . Sexually Abused: Not on file     Constitutional: Pt reports  fever. Denies malaise, fatigue, headache or abrupt weight changes.  HEENT: Pt reports runny nose. Denies eye pain, eye redness, ear pain, ringing in the ears, wax buildup, nasal congestion, bloody nose, or sore throat. Respiratory: Pt reports cough. Denies difficulty breathing, shortness of breath.   Cardiovascular: Denies chest pain, chest tightness, palpitations or swelling in the hands or feet.  Psych: Pt reports depression. Denies anxiety, SI/HI.  No other specific complaints in a complete review of systems (except as listed in HPI above).  Observations/Objective: Pulse 99   Temp 99.4 F (37.4 C)   Ht 4\' 11"  (1.499 m)   Wt 166 lb (75.3 kg)   BMI 33.53 kg/m   Wt Readings from Last 3 Encounters:  02/04/20 169 lb (76.7 kg)  05/21/18 160 lb 12.8 oz (72.9 kg)  03/27/18 159 lb (72.1 kg)    General: Appears her stated age, in NAD. HEENT: Head: normal shape and size; Nose: congestion noted; Throat/Mouth: hoarsenss noted.  Pulmonary/Chest: Normal effort, wet cough noted.  Neurological: Alert and oriented.  Psychiatric: Mood and affect flat.     BMET    Component Value Date/Time   NA 139 02/04/2020 1533   NA 140 05/02/2017 0835   K 4.7 02/04/2020 1533   CL 103 02/04/2020 1533   CO2 27 02/04/2020 1533   GLUCOSE 92 02/04/2020 1533   BUN 13 02/04/2020 1533   BUN 12 05/02/2017 0835   CREATININE 0.78 02/04/2020 1533   CREATININE 0.72 06/05/2016 0814   CALCIUM 10.1 02/04/2020 1533   GFRNONAA 92 05/02/2017 0835   GFRAA 106 05/02/2017 0835    Lipid Panel      Component Value Date/Time   CHOL 154 02/04/2020 1533   CHOL 155 05/02/2017 0833   TRIG 162.0 (H) 02/04/2020 1533   HDL 35.60 (L) 02/04/2020 1533   HDL 35 (L) 05/02/2017 0833   CHOLHDL 4 02/04/2020 1533   VLDL 32.4 02/04/2020 1533   LDLCALC 86 02/04/2020 1533   LDLCALC 73 05/02/2017 0833    CBC    Component Value Date/Time   WBC 12.6 (H) 02/04/2020 1533   RBC 4.76 02/04/2020 1533   HGB 15.1 (H) 02/04/2020 1533   HCT 44.5 02/04/2020 1533   PLT 282.0 02/04/2020 1533   MCV 93.5 02/04/2020 1533   MCH 32.0 06/05/2016 0814   MCHC 33.9 02/04/2020 1533   RDW 13.4 02/04/2020 1533   LYMPHSABS 3,128 06/05/2016 0814   MONOABS 460 06/05/2016 0814   EOSABS 184 06/05/2016 0814   BASOSABS 0 06/05/2016 0814    Hgb A1C Lab Results  Component Value Date   HGBA1C 5.8 11/05/2016       Assessment and Plan:  Depression:  Will d/c Venlafaxine She is not interested in trying an alternative at this time  Fever, Runny Nose, Hoarseness, Cough:  Discussed this could be covid, but she does not want to get tested Encouraged masking, social distancing, frequent handwashing and self quarantine until symptoms improve RX for Azithromax x 5 days RX for Hycodan for cough  Cologuard was negative  Follow Up Instructions:    I discussed the assessment and treatment plan with the patient. The patient was provided an opportunity to ask questions and all were answered. The patient agreed with the plan and demonstrated an understanding of the instructions.   The patient was advised to call back or seek an in-person evaluation if the symptoms worsen or if the condition fails to improve as anticipated.    [name], NP

## 2020-03-29 ENCOUNTER — Encounter: Payer: Self-pay | Admitting: Cardiology

## 2020-03-29 ENCOUNTER — Telehealth (INDEPENDENT_AMBULATORY_CARE_PROVIDER_SITE_OTHER): Payer: Medicare Other | Admitting: Cardiology

## 2020-03-29 VITALS — Ht 59.0 in | Wt 166.0 lb

## 2020-03-29 DIAGNOSIS — I25708 Atherosclerosis of coronary artery bypass graft(s), unspecified, with other forms of angina pectoris: Secondary | ICD-10-CM

## 2020-03-29 DIAGNOSIS — E782 Mixed hyperlipidemia: Secondary | ICD-10-CM

## 2020-03-29 DIAGNOSIS — I10 Essential (primary) hypertension: Secondary | ICD-10-CM

## 2020-03-29 NOTE — Progress Notes (Signed)
Virtual Visit via Telephone Note   This visit type was conducted due to national recommendations for restrictions regarding the COVID-19 Pandemic (e.g. social distancing) in an effort to limit this patient's exposure and mitigate transmission in our community.  Due to her co-morbid illnesses, this patient is at least at moderate risk for complications without adequate follow up.  This format is felt to be most appropriate for this patient at this time.  The patient did not have access to video technology/had technical difficulties with video requiring transitioning to audio format only (telephone).  All issues noted in this document were discussed and addressed.  No physical exam could be performed with this format.  Please refer to the patient's chart for her  consent to telehealth for [facility].    Date:  03/29/2020   ID:  [name], DOB 1956/01/21, MRN [id number] The patient was identified using 2 identifiers.  Patient Location: Home Provider Location: Home Office  PCP:  Jearld Fenton, NP  Cardiologist:  Dr Martinique Electrophysiologist:  None   Evaluation Performed:  Follow-Up Visit  Chief Complaint:  none  History of Present Illness:    [name] is a 64 y.o. female with a history of CAD, s/p remote coronary bypass surgery in 2002. She had prior stenting of the vein graft to the obtuse marginal vessel in 2009. She then had a drug-eluting stent placement to this same saphenous vein graft to the ramus in March of 2012. This subsequently occluded and she underwent angioplasty and drug-eluting stent to the ramus intermediate branch in September of 2012. Cardiac catheterization again in October of 2012 showed nonobstructive disease. Myoview in July 2015 was low risk.  Her last office visit with Dr Martinique was in 2019.  The patient was contacted today for routine follow-up. Since we saw her last she is done well from a cardiac standpoint. She says she occasionally has to take a  nitroglycerin but the frequency has not changed. Overall she feels like she is doing well from the standpoint. She does continue to smoke. Recently she developed a upper respiratory infection. Her primary care provider is following her for this. She had labs done in August and I reviewed those. Her blood pressure has been controlled.  The patient does not have symptoms concerning for COVID-19 infection (fever, chills, cough, or new shortness of breath).    Past Medical History:  Diagnosis Date  . Anemia   . Anxiety   . Blood transfusion   . Chest pain, cardiac    Has chronic chest pain.   . Complication of anesthesia    "my blood pressure will drop out on me"  . Coronary artery disease    a) s/p CABG x 2 in [place] 2002. b)stenting to the SVG-OM in 2009. c)DES to the saphenous vein graft to the ramus in March 2012. d) s/p PTCA/DES to native ramus intermedius in September 2012. Last cath 03/2011 with minimal nonobstructive disease  e) indeterminant myoview 09/2011 but atypical CP & no perfusion defect in ramus intermediate distribution, EF 52%  . Depression   . 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 Enterocolitis   . Gastroparesis   . GERD (gastroesophageal reflux disease)   . GI bleeding   . Hematuria 09/25/11   "lately"  . Hx: UTI (urinary tract infection)    multiple  . Hyperlipidemia   . Hypertension   . Hypothyroidism   . Migraines    h/o  .  PTSD (post-traumatic stress disorder)   . Tobacco abuse    Past Surgical History:  Procedure Laterality Date  . ABDOMINAL HYSTERECTOMY  1980's  . BIOPSY STOMACH  09/25/11   "several in the last year; all negative; via endoscopy"  . BREAST SURGERY     right biopsy  . CHOLECYSTECTOMY  2000's  . CORONARY ANGIOPLASTY WITH STENT PLACEMENT     "lots; they've all collapsed; last time dr took native vein &  turned it around & put stents in it before attaching"  . CORONARY ARTERY BYPASS GRAFT  2002   CABG X2  . CYSTO  WITH HYDRODISTENSION  03/28/2012   Procedure: CYSTOSCOPY/HYDRODISTENSION;  Surgeon: Reece Packer, MD;  Location: [facility];  Service: Urology;  Laterality: N/A;  . CYSTOSCOPY W/ RETROGRADES  03/28/2012   Procedure: CYSTOSCOPY WITH RETROGRADE PYELOGRAM;  Surgeon: Reece Packer, MD;  Location: [facility];  Service: Urology;  Laterality: Bilateral;  . CYSTOSCOPY WITH INJECTION  03/28/2012   Procedure: CYSTOSCOPY WITH INJECTION;  Surgeon: Reece Packer, MD;  Location: [facility];  Service: Urology;  Laterality: N/A;  Marcaine and Pyridium  . PILONIDAL CYST / SINUS EXCISION  1980's   "was wrapped around my spinal cord; Dr. Norville Haggard got almost all of it except a tiny bit; said it was really deep; packed fatty tissue in the hole; really bothers me alot"  . SALPINGOOPHORECTOMY  1980's   "2 surgeries; 1st right; then left"  . TMJ ARTHROPLASTY  1980's   right side; "I've had to have it twice"  . VESICOVAGINAL FISTULA CLOSURE W/ TAH  1983     Current Meds  Medication Sig  . amLODipine (NORVASC) 5 MG tablet TAKE 1 TABLET BY MOUTH DAILY  . azithromycin (ZITHROMAX) 250 MG tablet Take 2 tabs today, then 1 tab daily x 4 days  . Cholecalciferol (VITAMIN D3) 125 MCG (5000 UT) CAPS Take 10,000 Units by mouth.  . clopidogrel (PLAVIX) 75 MG tablet Take 1 tablet (75 mg total) by mouth daily. NEED OV.  Marland Kitchen DEXILANT 30 MG capsule TAKE 1 CAPSULE(30 MG) BY MOUTH DAILY  . diphenhydramine-acetaminophen (TYLENOL PM) 25-500 MG TABS Take 1 tablet by mouth at bedtime as needed (pain, sleep).  . fenofibrate micronized (LOFIBRA) 67 MG capsule Take 1 capsule (67 mg total) by mouth daily before breakfast.  . HYDROcodone-homatropine (HYCODAN) 5-1.5 MG/5ML syrup Take 5 mLs by mouth every 8 (eight) hours as needed for cough.  . levothyroxine (SYNTHROID) 75 MCG tablet Take 1 tablet (75 mcg total) by mouth daily.  . metoprolol succinate (TOPROL-XL) 50 MG 24 hr tablet TAKE 1 TABLET BY MOUTH EVERY DAY WITH OR IMMEDIATELY FOLLOWING  A MEAL  . Multiple Vitamin (MULTIVITAMIN) tablet Take 1 tablet by mouth daily.  . nitroGLYCERIN (NITROSTAT) 0.4 MG SL tablet Place 1 tablet (0.4 mg total) under the tongue every 5 (five) minutes as needed for chest pain.  . rosuvastatin (CRESTOR) 20 MG tablet Take 1 tablet (20 mg total) by mouth daily.     Allergies:   Ranitidine, Cimetidine, Sulfamethoxazole-trimethoprim, and Sulfa antibiotics   Social History   Tobacco Use  . Smoking status: Current Every Day Smoker    Packs/day: 1.00    Years: 15.00    Pack years: 15.00    Types: Cigarettes  . Smokeless tobacco: Never Used  Vaping Use  . Vaping Use: Never used  Substance Use Topics  . Alcohol use: No  . Drug use: Not Currently    Types: Marijuana  Comment: "as a teen"     Family Hx: The patient's family history includes Asthma in her mother; Breast cancer in her mother; Emphysema in her mother; Heart disease in her father and paternal grandfather.  ROS:   Please see the history of present illness.    All other systems reviewed and are negative.   Prior CV studies:   The following studies were reviewed today:  Myoview 2015  Labs/Other Tests and Data Reviewed:    EKG:  An ECG dated 05/21/2018 was personally reviewed today and demonstrated:  NSR- HR 71  Recent Labs: 02/04/2020: ALT 23; BUN 13; Creatinine, Ser 0.78; Hemoglobin 15.1; Platelets 282.0; Potassium 4.7; Sodium 139; TSH 3.58   Recent Lipid Panel Lab Results  Component Value Date/Time   CHOL 154 02/04/2020 03:33 PM   CHOL 155 05/02/2017 08:33 AM   TRIG 162.0 (H) 02/04/2020 03:33 PM   HDL 35.60 (L) 02/04/2020 03:33 PM   HDL 35 (L) 05/02/2017 08:33 AM   CHOLHDL 4 02/04/2020 03:33 PM   LDLCALC 86 02/04/2020 03:33 PM   LDLCALC 73 05/02/2017 08:33 AM   LDLDIRECT 100.0 07/29/2018 08:17 AM    Wt Readings from Last 3 Encounters:  03/29/20 166 lb (75.3 kg)  03/25/20 166 lb (75.3 kg)  02/04/20 169 lb (76.7 kg)     Objective:    Vital Signs:  Ht 4'  11" (1.499 m)   Wt 166 lb (75.3 kg)   BMI 33.53 kg/m    VITAL SIGNS:  reviewed  ASSESSMENT & PLAN:    Coronary artery disease CABG '02, SVG-OM PCI '09, SVG-RI PCI 3/12, RI DES for ISR 9/12, cath 10/12 OK.  Myoview 2015 low risk.  She has occasional angina which responds to NTG.   Hyperlipidemia On statin- LDL 86 Aug 2021  Hypertension Controlled  URI- PCP following- she is still smoking  Plan: Encouraged to stop smoking.  F/U in the office in 6 months.   COVID-19 Education: The signs and symptoms of COVID-19 were discussed with the patient and how to seek care for testing (follow up with PCP or arrange E-visit).  The importance of social distancing was discussed today. She has not been vaccinated.  Her PCP suggested she hold off on this till she is over her URI.   Time:   Today, I have spent 15 minutes with the patient with telehealth technology discussing the above problems.     Medication Adjustments/Labs and Tests Ordered: Current medicines are reviewed at length with the patient today.  Concerns regarding medicines are outlined above.   Tests Ordered: No orders of the defined types were placed in this encounter.   Medication Changes: No orders of the defined types were placed in this encounter.   Follow Up:  In Person in 6 months with Dr Martinique.   Signed, Kerin Ransom, PA-C  03/29/2020 8:32 AM    [facility]

## 2020-03-29 NOTE — Patient Instructions (Signed)
Medication Instructions:  Continue current medications  *If you need a refill on your cardiac medications before your next appointment, please call your pharmacy*   Lab Work: None Ordered   Testing/Procedures: None Ordered   Follow-Up: At [facility], you and your health needs are our priority.  As part of our continuing mission to provide you with exceptional heart care, we have created designated Provider Care Teams.  These Care Teams include your primary Cardiologist (physician) and Advanced Practice Providers (APPs -  Physician Assistants and Nurse Practitioners) who all work together to provide you with the care you need, when you need it.  We recommend signing up for the patient portal called "MyChart".  Sign up information is provided on this After Visit Summary.  MyChart is used to connect with patients for Virtual Visits (Telemedicine).  Patients are able to view lab/test results, encounter notes, upcoming appointments, etc.  Non-urgent messages can be sent to your provider as well.   To learn more about what you can do with MyChart, go to [email or web address].    Your next appointment:   6 month(s)  The format for your next appointment:   In Person  Provider:   You may see Peter Jordan, MD or one of the following Advanced Practice Providers on your designated Care Team:    Hao Meng, PA-C  Angela Duke, PA-C or   Krista Kroeger, PA-C      

## 2020-03-30 ENCOUNTER — Telehealth: Payer: Self-pay

## 2020-03-30 NOTE — Telephone Encounter (Signed)
She needs to get covid tested as suggested. Try Flonase for nasal congestion. No indication for additional abx at this time.

## 2020-03-30 NOTE — Telephone Encounter (Signed)
Pt reports she has completed Ax and has not had much relief... cough syrup is helping only a little bit... she says she is coughing so much, her abdomen is sore and is mostly unproductive...Marland Kitchen pt is aware that a cough can last for weeks after Sx started.... pt reports she is now having nasal congestion and not as runny as before... please advise

## 2020-03-31 NOTE — Telephone Encounter (Signed)
Pt is aware as instructed and states at this time she has no way to get a ride... pt advised to try at least call around to see if anyone has OTC rapid covid test available.... ER precautions given

## 2020-04-01 DIAGNOSIS — Z20822 Contact with and (suspected) exposure to covid-19: Secondary | ICD-10-CM | POA: Diagnosis not present

## 2020-04-04 NOTE — Telephone Encounter (Signed)
Pt lvm stating she had COVID test done at [facility] and received negative results yesterday.  However, she still has cough and is asking if Rollene Fare would send rx for prednisone.  Pt can be reached at [phone].

## 2020-04-05 MED ORDER — PREDNISONE 10 MG PO TABS: ORAL_TABLET | ORAL | 0 refills | Status: DC

## 2020-04-05 NOTE — Addendum Note (Signed)
Addended by: Jearld Fenton on: 04/05/2020 07:52 AM   Modules accepted: Orders

## 2020-04-05 NOTE — Telephone Encounter (Signed)
Prednisone sent to [facility].

## 2020-04-07 ENCOUNTER — Other Ambulatory Visit: Payer: Self-pay | Admitting: Internal Medicine

## 2020-04-20 ENCOUNTER — Telehealth: Payer: Self-pay | Admitting: *Deleted

## 2020-04-20 NOTE — Telephone Encounter (Addendum)
Patient called stating that her son's girlfriend lives with her and she has been exposed to covid. [name] stated that her girlfriends mother tested positive for covid yesterday and the girlfriend was with her mother over the weekend,  Patient stated that she is just getting over a bad cold and still has a cough. Patient stated that she is concerned about her possible exposure. Patient wanted to know what she should do. Advised patient that our providers usually recommended that people get tested in about 5 days after exposure or sooner if any symptoms. Patient stated that she is going to go ahead and schedule an appointment to get tested. Testing site information given to patient. Patient was advised that she should quarantine until she gets her test results back. Patient stated that she has not been vaccinated. Patient was given ER precautions and she verbalized understanding. Patient requested that message go back to [name] NP to see if she has any other recommendations.

## 2020-04-20 NOTE — Telephone Encounter (Signed)
Wait 5 days for testing if she can, otherwise monitor unless she develops symptoms, call us back.

## 2020-04-23 ENCOUNTER — Other Ambulatory Visit: Payer: Medicare Other

## 2020-04-23 DIAGNOSIS — Z20822 Contact with and (suspected) exposure to covid-19: Secondary | ICD-10-CM

## 2020-04-24 LAB — NOVEL CORONAVIRUS, NAA: SARS-CoV-2, NAA: NOT DETECTED

## 2020-04-24 LAB — SPECIMEN STATUS REPORT

## 2020-04-24 LAB — SARS-COV-2, NAA 2 DAY TAT

## 2020-04-30 ENCOUNTER — Other Ambulatory Visit: Payer: Medicare Other

## 2020-04-30 ENCOUNTER — Other Ambulatory Visit: Payer: Self-pay

## 2020-04-30 DIAGNOSIS — Z20822 Contact with and (suspected) exposure to covid-19: Secondary | ICD-10-CM

## 2020-05-01 LAB — SARS-COV-2, NAA 2 DAY TAT

## 2020-05-01 LAB — NOVEL CORONAVIRUS, NAA: SARS-CoV-2, NAA: NOT DETECTED

## 2020-05-18 ENCOUNTER — Other Ambulatory Visit: Payer: Self-pay

## 2020-05-18 ENCOUNTER — Telehealth (INDEPENDENT_AMBULATORY_CARE_PROVIDER_SITE_OTHER): Payer: Medicare Other | Admitting: Primary Care

## 2020-05-18 ENCOUNTER — Encounter: Payer: Self-pay | Admitting: Primary Care

## 2020-05-18 VITALS — HR 82 | Ht 59.0 in | Wt 166.0 lb

## 2020-05-18 DIAGNOSIS — B850 Pediculosis due to Pediculus humanus capitis: Secondary | ICD-10-CM | POA: Diagnosis not present

## 2020-05-18 MED ORDER — PERMETHRIN 5 % EX CREA: TOPICAL_CREAM | CUTANEOUS | 0 refills | Status: DC

## 2020-05-18 NOTE — Assessment & Plan Note (Signed)
Active head lice noted today. She's had head lice several years ago, this feels exactly the same. Will treat with permethrin cream, discussed specific directions for use.  May repeat in 7 days if needed.  Discussed to clean her entire home including furniture, linens, etc.

## 2020-05-18 NOTE — Progress Notes (Signed)
Subjective:    Patient ID: [name], female    DOB: 10/30/1955, 64 y.o.   MRN: [id number]  HPI  This visit occurred during the SARS-CoV-2 public health emergency.  Safety protocols were in place, including screening questions prior to the visit, additional usage of staff PPE, and extensive cleaning of exam room while observing appropriate contact time as indicated for disinfecting solutions.   Virtual Visit via Video Note  I connected with Saja Bartolini on 05/18/20 at 10:40 AM EST by a video enabled telemedicine application and verified that I am speaking with the correct person using two identifiers.  Location: Patient: Home Provider: Office Participants: Patient and myself   I discussed the limitations of evaluation and management by telemedicine and the availability of in person appointments. The patient expressed understanding and agreed to proceed.  History of Present Illness:  [name] is a 64 year old female patient of [name] with a history of hypertension, COPD, CAD, IBS, hypothyroidism, anxiety and depression who presents today with a chief complaint of head lice.  She feels that she has head lice. Her head began itching 2-3 days ago. She was scratching her head two days ago and saw an insect in her fingernail. She then proceeded to the bathroom, parted her hair and saw a "ton" of bugs in her hair. Since then her scalp has been itching.   She's since cut some of her hair off and has used her dog's shampoo without improvement. She's mostly itching. She has been able to remove some bugs from her hair. She ordered some special spray to spray down furniture, will not get this until Saturday.    She denies a known exposure to lice as no one else in her family has these symptoms. She doesn't leave her home often.   Observations/Objective:  Alert and oriented. Appears well, not sickly. No distress. Speaking in complete sentences. Evidence of several small lice  appearing insects noted to scalp during visit.   Assessment and Plan:  Active head lice noted today. She's had head lice several years ago, this feels exactly the same. Will treat with permethrin cream, discussed specific directions for use.  May repeat in 7 days if needed.  Discussed to clean her entire home including furniture, linens, etc.  Follow Up Instructions:  Apply 1/2 tube of the cream to washed hair. Do not use conditioner.  Leave the cream in your hair for 10 minutes, then rinse out with water.  You can repeat this again in 7 days with the remaining half of the tube if needed.  Clean all linens, furniture, clothing, etc as discussed.  It was a pleasure to see you today! Allie Bossier, NP-C    I discussed the assessment and treatment plan with the patient. The patient was provided an opportunity to ask questions and all were answered. The patient agreed with the plan and demonstrated an understanding of the instructions.   The patient was advised to call back or seek an in-person evaluation if the symptoms worsen or if the condition fails to improve as anticipated.    Pleas Koch, NP    Review of Systems  Constitutional: Negative for fever.  Skin: Negative for rash.       Head lice       Past Medical History:  Diagnosis Date   Anemia    Anxiety    Blood transfusion    Chest pain, cardiac    Has chronic chest pain.  Complication of anesthesia    "my blood pressure will drop out on me"   Coronary artery disease    a) s/p CABG x 2 in [place] 2002. b)stenting to the SVG-OM in 2009. c)DES to the saphenous vein graft to the ramus in March 2012. d) s/p PTCA/DES to native ramus intermedius in September 2012. 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"     Enterocolitis    Gastroparesis    GERD (gastroesophageal reflux disease)    GI bleeding    Hematuria 09/25/11   "lately"   Hx: UTI (urinary tract infection)    multiple   Hyperlipidemia    Hypertension    Hypothyroidism    Migraines    h/o   PTSD (post-traumatic stress disorder)    Tobacco abuse      Social History   Socioeconomic History   Marital status: Widowed    Spouse name: Not on file   Number of children: 2   Years of education: Not on file   Highest education level: Not on file  Occupational History   Occupation: Disabled  Tobacco Use   Smoking status: Current Every Day Smoker    Packs/day: 1.00    Years: 15.00    Pack years: 15.00    Types: Cigarettes   Smokeless tobacco: Never Used  Scientific laboratory technician Use: Never used  Substance and Sexual Activity   Alcohol use: No   Drug use: Not Currently    Types: Marijuana    Comment: "as a teen"   Sexual activity: Not Currently  Other Topics Concern   Not on file  Social History Narrative   Not on file   Social Determinants of Health   Financial Resource Strain:    Difficulty of Paying Living Expenses: Not on file  Food Insecurity:    Worried About Charity fundraiser in the Last Year: Not on file   [employer] of Food in the Last Year: Not on file  Transportation Needs:    Lack of Transportation (Medical): Not on file   Lack of Transportation (Non-Medical): Not on file  Physical Activity:    Days of Exercise per Week: Not on file   Minutes of Exercise per Session: Not on file  Stress:    Feeling of Stress : Not on file  Social Connections:    Frequency of Communication with Friends and Family: Not on file   Frequency of Social Gatherings with Friends and Family: Not on file   Attends Religious Services: Not on file   Active Member of Clubs or Organizations: Not on file   Attends Archivist Meetings: Not on file   Marital Status: Not on file  Intimate  Partner Violence:    Fear of Current or Ex-Partner: Not on file   Emotionally Abused: Not on file   Physically Abused: Not on file   Sexually Abused: Not on file    Past Surgical History:  Procedure Laterality Date   ABDOMINAL HYSTERECTOMY  1980's   BIOPSY STOMACH  09/25/11   "several in the last year; all negative; via endoscopy"   BREAST SURGERY     right biopsy   CHOLECYSTECTOMY  2000's   CORONARY ANGIOPLASTY WITH STENT PLACEMENT     "lots; they've all collapsed;  last time dr took native vein &  turned it around & put stents in it before attaching"   CORONARY ARTERY BYPASS GRAFT  2002   CABG X2   CYSTO WITH HYDRODISTENSION  03/28/2012   Procedure: CYSTOSCOPY/HYDRODISTENSION;  Surgeon: Reece Packer, MD;  Location: [facility];  Service: Urology;  Laterality: N/A;   CYSTOSCOPY W/ RETROGRADES  03/28/2012   Procedure: CYSTOSCOPY WITH RETROGRADE PYELOGRAM;  Surgeon: Reece Packer, MD;  Location: [facility];  Service: Urology;  Laterality: Bilateral;   CYSTOSCOPY WITH INJECTION  03/28/2012   Procedure: CYSTOSCOPY WITH INJECTION;  Surgeon: Reece Packer, MD;  Location[facility];  Service: Urology;  Laterality: N/A;  Marcaine and Pyridium   PILONIDAL CYST / SINUS EXCISION  1980's   "was wrapped around my spinal cord; Dr. Norville Haggard got almost all of it except a tiny bit; said it was really deep; packed fatty tissue in the hole; really bothers me alot"   SALPINGOOPHORECTOMY  1980's   "2 surgeries; 1st right; then left"   TMJ ARTHROPLASTY  1980's   right side; "I've had to have it twice"   VESICOVAGINAL FISTULA CLOSURE W/ TAH  1983    Family History  Problem Relation Age of Onset   Emphysema Mother        smoked   Asthma Mother    Breast cancer Mother    Heart disease Father    Heart disease Paternal Grandfather     Allergies  Allergen Reactions   Ranitidine Hives   Cimetidine Hives    Pt reported   Sulfamethoxazole-Trimethoprim     Patient couldn't  give specific allergy   Sulfa Antibiotics Other (See Comments)    Childhood reaction    Current Outpatient Medications on File Prior to Visit  Medication Sig Dispense Refill   amLODipine (NORVASC) 5 MG tablet TAKE 1 TABLET BY MOUTH DAILY 90 tablet 3   Cholecalciferol (VITAMIN D3) 125 MCG (5000 UT) CAPS Take 10,000 Units by mouth.     clopidogrel (PLAVIX) 75 MG tablet Take 1 tablet (75 mg total) by mouth daily. NEED OV. 90 tablet 0   DEXILANT 30 MG capsule TAKE 1 CAPSULE(30 MG) BY MOUTH DAILY 90 capsule 2   diphenhydramine-acetaminophen (TYLENOL PM) 25-500 MG TABS Take 1 tablet by mouth at bedtime as needed (pain, sleep).     fenofibrate micronized (LOFIBRA) 67 MG capsule Take 1 capsule (67 mg total) by mouth daily before breakfast. 90 capsule 2   levothyroxine (SYNTHROID) 75 MCG tablet Take 1 tablet (75 mcg total) by mouth daily. 90 tablet 2   metoprolol succinate (TOPROL-XL) 50 MG 24 hr tablet TAKE 1 TABLET BY MOUTH EVERY DAY WITH OR IMMEDIATELY FOLLOWING A MEAL 90 tablet 0   Multiple Vitamin (MULTIVITAMIN) tablet Take 1 tablet by mouth daily.     rosuvastatin (CRESTOR) 20 MG tablet Take 1 tablet (20 mg total) by mouth daily. 90 tablet 2   nitroGLYCERIN (NITROSTAT) 0.4 MG SL tablet Place 1 tablet (0.4 mg total) under the tongue every 5 (five) minutes as needed for chest pain. 90 tablet 3   Current Facility-Administered Medications on File Prior to Visit  Medication Dose Route Frequency Provider Last Rate Last Admin   bupivacaine (MARCAINE) 0.5 % 15 mL, phenazopyridine (PYRIDIUM) 400 mg bladder mixture   Bladder Instillation Once MacDiarmid, Scott, MD        Pulse 82    Ht 4\' 11"  (1.499 m)    Wt 166 lb (75.3 kg)  BMI 33.53 kg/m    Objective:   Physical Exam Constitutional:      General: She is not in acute distress.    Appearance: She is not ill-appearing.  Pulmonary:     Effort: Pulmonary effort is normal.  Skin:    General: Skin is dry.     Comments: Evidence of  several small lice appearing insects to scalp.   Neurological:     Mental Status: She is alert.  Psychiatric:        Mood and Affect: Mood normal.            Assessment & Plan:

## 2020-05-18 NOTE — Patient Instructions (Signed)
Apply 1/2 tube of the cream to washed hair. Do not use conditioner.  Leave the cream in your hair for 10 minutes, then rinse out with water.  You can repeat this again in 7 days with the remaining half of the tube if needed.  Clean all linens, furniture, clothing, etc as discussed.  It was a pleasure to see you today! Allie Bossier, NP-C

## 2020-05-27 ENCOUNTER — Other Ambulatory Visit: Payer: Self-pay | Admitting: Cardiology

## 2020-05-27 ENCOUNTER — Other Ambulatory Visit: Payer: Self-pay | Admitting: Primary Care

## 2020-05-27 DIAGNOSIS — B850 Pediculosis due to Pediculus humanus capitis: Secondary | ICD-10-CM

## 2020-05-27 NOTE — Telephone Encounter (Signed)
Did you want to refill this?

## 2020-05-27 NOTE — Telephone Encounter (Signed)
We need to find out how she's doing. Is she still seeing lice? Any improvement with original dose?

## 2020-05-30 NOTE — Telephone Encounter (Signed)
Called patient not seeing any lice but is having some itching at neck and behind ears still.

## 2020-08-11 ENCOUNTER — Other Ambulatory Visit: Payer: Self-pay | Admitting: Cardiology

## 2020-09-21 ENCOUNTER — Telehealth (INDEPENDENT_AMBULATORY_CARE_PROVIDER_SITE_OTHER): Payer: Medicare Other | Admitting: Physician Assistant

## 2020-09-21 DIAGNOSIS — A084 Viral intestinal infection, unspecified: Secondary | ICD-10-CM

## 2020-09-21 DIAGNOSIS — R300 Dysuria: Secondary | ICD-10-CM | POA: Diagnosis not present

## 2020-09-21 DIAGNOSIS — B3749 Other urogenital candidiasis: Secondary | ICD-10-CM | POA: Diagnosis not present

## 2020-09-21 MED ORDER — NYSTATIN 100000 UNIT/GM EX CREA: 1.0000 "application " | TOPICAL_CREAM | Freq: Two times a day (BID) | CUTANEOUS | 0 refills | Status: DC

## 2020-09-21 MED ORDER — DICYCLOMINE HCL 10 MG PO CAPS: 10.0000 mg | ORAL_CAPSULE | Freq: Three times a day (TID) | ORAL | 0 refills | Status: DC

## 2020-09-21 MED ORDER — PROMETHAZINE HCL 25 MG PO TABS: 25.0000 mg | ORAL_TABLET | Freq: Four times a day (QID) | ORAL | 0 refills | Status: DC | PRN

## 2020-09-21 MED ORDER — NITROFURANTOIN MONOHYD MACRO 100 MG PO CAPS: 100.0000 mg | ORAL_CAPSULE | Freq: Two times a day (BID) | ORAL | 0 refills | Status: AC

## 2020-09-21 NOTE — Patient Instructions (Signed)
Please take the medications as directed.  30 to 60 minutes after the Phenergan please try to have a small sip of fluid in small increments and then slowly add salty snacks and brat diet.  Should you continue to be unable to keep any fluids in your system I would advise emergency department for IV fluids in the next 24 hours.  Please call with updates.

## 2020-09-21 NOTE — Progress Notes (Signed)
Virtual Visit via Video Note  I connected with Antoinetta Berrones on 09/21/20 at  2:00 PM EDT by a video enabled telemedicine application and verified that I am speaking with the correct person using two identifiers.  Location: Patient: home Provider: Therapist, music at [employer]   I discussed the limitations of evaluation and management by telemedicine and the availability of in person appointments. The patient expressed understanding and agreed to proceed.  Only the patient and myself were present for today's video call.   History of Present Illness: Temp 99.6 F currently  Chief complaint: Vomiting, diarrhea, nausea Symptom onset: Saturday Pertinent positives: Fever (resolved today), some weakness, abdominal cramping Pertinent negatives: Cough, congestion Treatments tried: Pepto-bismol (vomited this up) Sick exposure: Granddaughter with stomach bug last week   COVID test at home negative   Observations/Objective:   Gen: Awake, alert, no acute distress Resp: Breathing is even and non-labored Psych: calm/pleasant demeanor Neuro: Alert and Oriented x 3, + facial symmetry, speech is clear.   Assessment and Plan: 1. Viral gastroenteritis Viral gastroenteritis symptoms.  It sounds like her fever has resolved.  She is trying to rehydrate but is having trouble keeping anything down and continues to have persistent diarrhea.  I have a very low threshold for her going to the emergency department for IV fluids at this time.  We are going to try some outpatient Phenergan and dicyclomine for her symptoms.  If she still cannot keep fluids in her system, she is agreeable to go to the ED at that time.  2. Dysuria She is also having burning with urination which is most likely secondary to #1.  I am going to start her on Macrobid for presumed acute cystitis.  I advised her to start this antibiotic after she is able to keep a small amount of food in her system in the next 24 hours.   She also needs to try to keep pushing fluids.  3. Candidiasis of perineum Again this is secondary to #1.  I sent in nystatin cream to hopefully alleviate some of these symptoms.   Follow Up Instructions:    I discussed the assessment and treatment plan with the patient. The patient was provided an opportunity to ask questions and all were answered. The patient agreed with the plan and demonstrated an understanding of the instructions.   The patient was advised to call back or seek an in-person evaluation if the symptoms worsen or if the condition fails to improve as anticipated.  Tayton Decaire M Jeannia Tatro, PA-C

## 2020-09-23 NOTE — Progress Notes (Signed)
[name] Date of Birth: [birth year] Medical Record #[id number]  History of Present Illness: [name] is seen today for follow up CAD. She had remote coronary bypass surgery in 2002. She had prior stenting of the vein graft to the obtuse marginal vessel in 2009. She then had a drug-eluting stent placement to this same saphenous vein graft to the ramus in March of 2012. This subsequently occluded and she underwent angioplasty and drug-eluting stent to the ramus intermediate branch in September of 2012. Cardiac catheterization again in October of 2012 showed nonobstructive disease. Her last evaluation with [name] in July 2015 showed no ischemia and normal EF.  I haven't seen her in a couple of years. She reports she is doing better from a heart standpoint. She rarely has to use Ntg now. She has noted some dizzy spells. Did have a bout of stomach flu recently and hasn't felt well since. She is still smoking 1/2 pk/day. No palpitations.  Current Outpatient Medications on File Prior to Visit  Medication Sig Dispense Refill  . amLODipine (NORVASC) 5 MG tablet TAKE 1 TABLET BY MOUTH DAILY 90 tablet 3  . ascorbic acid (VITAMIN C) 100 MG tablet Take 100 mg by mouth daily.    . Cholecalciferol (VITAMIN D3) 125 MCG (5000 UT) CAPS Take 10,000 Units by mouth.    . clopidogrel (PLAVIX) 75 MG tablet Take 1 tablet (75 mg total) by mouth daily. NEED OV. 90 tablet 0  . DEXILANT 30 MG capsule TAKE 1 CAPSULE(30 MG) BY MOUTH DAILY 90 capsule 2  . diphenhydramine-acetaminophen (TYLENOL PM) 25-500 MG TABS Take 1 tablet by mouth at bedtime as needed (pain, sleep).    . fenofibrate micronized (LOFIBRA) 67 MG capsule Take 1 capsule (67 mg total) by mouth daily before breakfast. 90 capsule 2  . levothyroxine (SYNTHROID) 75 MCG tablet Take 1 tablet (75 mcg total) by mouth daily. 90 tablet 2  . metoprolol succinate (TOPROL-XL) 50 MG 24 hr tablet TAKE 1 TABLET BY MOUTH EVERY DAY WITH OR IMMEDIATELY FOLLOWING A MEAL  90 tablet 3  . Multiple Vitamin (MULTIVITAMIN) tablet Take 1 tablet by mouth daily.    . nitroGLYCERIN (NITROSTAT) 0.4 MG SL tablet Place 1 tablet (0.4 mg total) under the tongue every 5 (five) minutes as needed for chest pain. 90 tablet 3  . promethazine (PHENERGAN) 25 MG tablet Take 1 tablet (25 mg total) by mouth every 6 (six) hours as needed for nausea or vomiting. 20 tablet 0  . Zinc 100 MG TABS Take 100 mg by mouth daily.     Current Facility-Administered Medications on File Prior to Visit  Medication Dose Route Frequency Provider Last Rate Last Admin  . bupivacaine (MARCAINE) 0.5 % 15 mL, phenazopyridine (PYRIDIUM) 400 mg bladder mixture   Bladder Instillation Once Bjorn Loser, MD        Allergies  Allergen Reactions  . Ranitidine Hives  . Cimetidine Hives    Pt reported  . Sulfamethoxazole-Trimethoprim     Patient couldn't give specific allergy  . Sulfa Antibiotics Other (See Comments)    Childhood reaction    Past Medical History:  Diagnosis Date  . Anemia   . Anxiety   . Blood transfusion   . Chest pain, cardiac    Has chronic chest pain.   . Complication of anesthesia    "my blood pressure will drop out on me"  . Coronary artery disease    a) s/p CABG x 2 in [place] 2002. b)stenting to the SVG-OM  in 2009. c)DES to the saphenous vein graft to the ramus in March 2012. d) s/p PTCA/DES to native ramus intermedius in September 2012. Last cath 03/2011 with minimal nonobstructive disease  e) indeterminant myoview 09/2011 but atypical CP & no perfusion defect in ramus intermediate distribution, EF 52%  . Depression   . 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 Enterocolitis   . Gastroparesis   . GERD (gastroesophageal reflux disease)   . GI bleeding   . Hematuria 09/25/11   "lately"  . Hx: UTI (urinary tract infection)    multiple  . Hyperlipidemia   . Hypertension   . Hypothyroidism   . Migraines    h/o  . PTSD (post-traumatic stress  disorder)   . Tobacco abuse     Past Surgical History:  Procedure Laterality Date  . ABDOMINAL HYSTERECTOMY  1980's  . BIOPSY STOMACH  09/25/11   "several in the last year; all negative; via endoscopy"  . BREAST SURGERY     right biopsy  . CHOLECYSTECTOMY  2000's  . CORONARY ANGIOPLASTY WITH STENT PLACEMENT     "lots; they've all collapsed; last time dr took native vein &  turned it around & put stents in it before attaching"  . CORONARY ARTERY BYPASS GRAFT  2002   CABG X2  . CYSTO WITH HYDRODISTENSION  03/28/2012   Procedure: CYSTOSCOPY/HYDRODISTENSION;  Surgeon: Reece Packer, MD;  Location: [facility];  Service: Urology;  Laterality: N/A;  . CYSTOSCOPY W/ RETROGRADES  03/28/2012   Procedure: CYSTOSCOPY WITH RETROGRADE PYELOGRAM;  Surgeon: Reece Packer, MD;  Location: [facility];  Service: Urology;  Laterality: Bilateral;  . CYSTOSCOPY WITH INJECTION  03/28/2012   Procedure: CYSTOSCOPY WITH INJECTION;  Surgeon: Reece Packer, MD;  Location: [facility];  Service: Urology;  Laterality: N/A;  Marcaine and Pyridium  . PILONIDAL CYST / SINUS EXCISION  1980's   "was wrapped around my spinal cord; Dr. Norville Haggard got almost all of it except a tiny bit; said it was really deep; packed fatty tissue in the hole; really bothers me alot"  . SALPINGOOPHORECTOMY  1980's   "2 surgeries; 1st right; then left"  . TMJ ARTHROPLASTY  1980's   right side; "I've had to have it twice"  . VESICOVAGINAL FISTULA CLOSURE W/ TAH  1983    Social History   Tobacco Use  Smoking Status Current Every Day Smoker  . Packs/day: 1.00  . Years: 15.00  . Pack years: 15.00  . Types: Cigarettes  Smokeless Tobacco Never Used    Social History   Substance and Sexual Activity  Alcohol Use No    Family History  Problem Relation Age of Onset  . Emphysema Mother        smoked  . Asthma Mother   . Breast cancer Mother   . Heart disease Father   . Heart disease Paternal Grandfather     Review of  Systems: The review of systems is as noted in HPI.   All other systems were reviewed and are negative.  Physical Exam: BP 104/86 (BP Location: Left Arm, Patient Position: Sitting)   Pulse 95   Ht 5' (1.524 m)   Wt 166 lb 3.2 oz (75.4 kg)   SpO2 96%   BMI 32.46 kg/m  GENERAL:  Well appearing WF in NAD HEENT:  PERRL, EOMI, sclera are clear. Oropharynx is clear. NECK:  No jugular venous distention, carotid upstroke brisk and symmetric, no bruits, no  thyromegaly or adenopathy LUNGS:  Clear to auscultation bilaterally CHEST:  Unremarkable HEART:  RRR,  PMI not displaced or sustained,S1 and S2 within normal limits, no S3, no S4: no clicks, no rubs, no murmurs ABD:  Soft, nontender. BS +, no masses or bruits. No hepatomegaly, no splenomegaly EXT:  2 + pulses throughout, no edema, no cyanosis no clubbing SKIN:  Warm and dry.  No rashes NEURO:  Alert and oriented x 3. Cranial nerves II through XII intact. PSYCH:  Cognitively intact   LABORATORY DATA:   Lab Results  Component Value Date   WBC 12.6 (H) 02/04/2020   HGB 15.1 (H) 02/04/2020   HCT 44.5 02/04/2020   PLT 282.0 02/04/2020   GLUCOSE 92 02/04/2020   CHOL 154 02/04/2020   TRIG 162.0 (H) 02/04/2020   HDL 35.60 (L) 02/04/2020   LDLDIRECT 100.0 07/29/2018   LDLCALC 86 02/04/2020   ALT 23 02/04/2020   AST 18 02/04/2020   NA 139 02/04/2020   K 4.7 02/04/2020   CL 103 02/04/2020   CREATININE 0.78 02/04/2020   BUN 13 02/04/2020   CO2 27 02/04/2020   TSH 3.58 02/04/2020   INR 1.07 09/25/2011   HGBA1C 5.8 11/05/2016   Ecg today shows NSR with normal Ecg. Rate 95. I have personally reviewed and interpreted this study.  Assessment / Plan: 1. Coronary disease with previous bypass and multiple interventions as noted above. She has chronic and atypical chest pain.  Her symptoms are actually much better currently.    Continue  Plavix, amlodipine, Toprol XL and Crestor.  2. Hypertension, well controlled on medication.  3. Tobacco  abuse.  Encourage complete cessation.   4. Hyperlipidemia. LDL is not at goal < 70  on Crestor. Elevated triglycerides. Now on fenofibrate. I have recommended increasing Crestor to 40 mg daily and repeat lab in 3 months.  5. COPD  6. Hypothyroidism. Followed by primary care. Last TSH normal.   Follow up in one year

## 2020-09-29 ENCOUNTER — Ambulatory Visit (INDEPENDENT_AMBULATORY_CARE_PROVIDER_SITE_OTHER): Payer: Medicare Other | Admitting: Cardiology

## 2020-09-29 ENCOUNTER — Other Ambulatory Visit: Payer: Self-pay

## 2020-09-29 ENCOUNTER — Encounter: Payer: Self-pay | Admitting: Cardiology

## 2020-09-29 VITALS — BP 104/86 | HR 95 | Ht 60.0 in | Wt 166.2 lb

## 2020-09-29 DIAGNOSIS — I10 Essential (primary) hypertension: Secondary | ICD-10-CM

## 2020-09-29 DIAGNOSIS — I25708 Atherosclerosis of coronary artery bypass graft(s), unspecified, with other forms of angina pectoris: Secondary | ICD-10-CM | POA: Diagnosis not present

## 2020-09-29 DIAGNOSIS — E782 Mixed hyperlipidemia: Secondary | ICD-10-CM

## 2020-09-29 MED ORDER — ROSUVASTATIN CALCIUM 40 MG PO TABS: 40.0000 mg | ORAL_TABLET | Freq: Every day | ORAL | 3 refills | Status: DC

## 2020-09-29 NOTE — Patient Instructions (Signed)
Increase Crestor to 40 mg daily  Try and quit smoking.

## 2021-02-09 ENCOUNTER — Encounter: Payer: Medicare Other | Admitting: Internal Medicine

## 2021-02-28 ENCOUNTER — Encounter (HOSPITAL_COMMUNITY): Payer: Self-pay

## 2021-02-28 ENCOUNTER — Ambulatory Visit (HOSPITAL_COMMUNITY)
Admission: EM | Admit: 2021-02-28 | Discharge: 2021-02-28 | Disposition: A | Discharge status: Home / Self Care | Payer: Medicare Other | Attending: Physician Assistant | Admitting: Physician Assistant

## 2021-02-28 ENCOUNTER — Other Ambulatory Visit: Payer: Self-pay

## 2021-02-28 DIAGNOSIS — N3001 Acute cystitis with hematuria: Secondary | ICD-10-CM | POA: Insufficient documentation

## 2021-02-28 LAB — POCT URINALYSIS DIPSTICK, ED / UC
Bilirubin Urine: NEGATIVE
Glucose, UA: NEGATIVE mg/dL
Ketones, ur: NEGATIVE mg/dL
Nitrite: POSITIVE — AB
Protein, ur: NEGATIVE mg/dL
Specific Gravity, Urine: 1.01 (ref 1.005–1.030)
Urobilinogen, UA: 0.2 mg/dL (ref 0.0–1.0)
pH: 5.5 (ref 5.0–8.0)

## 2021-02-28 MED ORDER — NITROFURANTOIN MONOHYD MACRO 100 MG PO CAPS: 100.0000 mg | ORAL_CAPSULE | Freq: Two times a day (BID) | ORAL | 0 refills | Status: DC

## 2021-02-28 NOTE — Discharge Instructions (Addendum)
Take antibiotic as prescribed. Will call with urine culture as result. Recommend follow up if no gradual improvement or if symptoms worsen.

## 2021-02-28 NOTE — ED Triage Notes (Signed)
Pt in with c/o abdominal pain and urinary frequency x 1 week   Pt also c/o fever and chills with sx

## 2021-02-28 NOTE — ED Provider Notes (Signed)
[facility]    CSN: [id number] Arrival date & time: 02/28/21  0803      History   Chief Complaint Chief Complaint  Patient presents with   Abdominal Pain   Urinary Frequency    HPI [name] is a 65 y.o. female.   Patient presents today for evaluation of urinary frequency, and dysuria that has been ongoing for the last week.  States she has tried to use Azo without significant relief.  Reports that she has had some back pain.  Did have fever last night up to 101, and took Tylenol for this which did seem to help.  She admits to having 1 episode of vomiting last night, and has had some diffuse abdominal discomfort as well as lower back pain.  She states she gets UTIs frequently due to IBS-D.   The history is provided by the patient.  Abdominal Pain Associated symptoms: chills, dysuria, fever, nausea and vomiting   Associated symptoms: no chest pain and no shortness of breath   Urinary Frequency Associated symptoms include abdominal pain. Pertinent negatives include no chest pain and no shortness of breath.   Past Medical History:  Diagnosis Date   Anemia    Anxiety    Blood transfusion    Chest pain, cardiac    Has chronic chest pain.    Complication of anesthesia    "my blood pressure will drop out on me"   Coronary artery disease    a) s/p CABG x 2 in [place] 2002. b)stenting to the SVG-OM in 2009. c)DES to the saphenous vein graft to the ramus in March 2012. d) s/p PTCA/DES to native ramus intermedius in September 2012. 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"   Enterocolitis    Gastroparesis    GERD (gastroesophageal reflux disease)    GI bleeding    Hematuria 09/25/11   "lately"   Hx: UTI (urinary tract infection)    multiple   Hyperlipidemia     Hypertension    Hypothyroidism    Migraines    h/o   PTSD (post-traumatic stress disorder)    Tobacco abuse     Patient Active Problem List   Diagnosis Date Noted   Head lice infestation [id number]   COPD (chronic obstructive pulmonary disease) ([place]) 03/27/2018   History of anemia 11/06/2016   IBS (irritable bowel syndrome) 11/06/2016   GERD (gastroesophageal reflux disease) 08/18/2013   Anxiety and depression 11/01/2010   Coronary artery disease    Hypertension    Hyperlipidemia    Hypothyroidism     Past Surgical History:  Procedure Laterality Date   ABDOMINAL HYSTERECTOMY  1980's   BIOPSY STOMACH  09/25/11   "several in the last year; all negative; via endoscopy"   BREAST SURGERY     right biopsy   CHOLECYSTECTOMY  2000's   CORONARY ANGIOPLASTY WITH STENT PLACEMENT     "lots; they've all collapsed; last time dr took native vein &  turned it around & put stents in it before attaching"   CORONARY ARTERY BYPASS GRAFT  2002   CABG X2   CYSTO WITH HYDRODISTENSION  03/28/2012   Procedure: CYSTOSCOPY/HYDRODISTENSION;  Surgeon: Reece Packer, MD;  Location: [facility];  Service: Urology;  Laterality: N/A;   CYSTOSCOPY W/ RETROGRADES  03/28/2012  Procedure: CYSTOSCOPY WITH RETROGRADE PYELOGRAM;  Surgeon: Reece Packer, MD;  Location: [facility];  Service: Urology;  Laterality: Bilateral;   CYSTOSCOPY WITH INJECTION  03/28/2012   Procedure: CYSTOSCOPY WITH INJECTION;  Surgeon: Reece Packer, MD;  Location: [facility];  Service: Urology;  Laterality: N/A;  Marcaine and Pyridium   PILONIDAL CYST / SINUS EXCISION  1980's   "was wrapped around my spinal cord; Dr. Norville Haggard got almost all of it except a tiny bit; said it was really deep; packed fatty tissue in the hole; really bothers me alot"   SALPINGOOPHORECTOMY  1980's   "2 surgeries; 1st right; then left"   TMJ ARTHROPLASTY  1980's   right side; "I've had to have it twice"   [place]    OB  History   No obstetric history on file.      Home Medications    Prior to Admission medications   Medication Sig Start Date End Date Taking? Authorizing Provider  nitrofurantoin, macrocrystal-monohydrate, (MACROBID) 100 MG capsule Take 1 capsule (100 mg total) by mouth 2 (two) times daily. 02/28/21  Yes Francene Finders, PA-C  amLODipine (NORVASC) 5 MG tablet TAKE 1 TABLET BY MOUTH DAILY 08/11/20   Martinique, Peter M, MD  ascorbic acid (VITAMIN C) 100 MG tablet Take 100 mg by mouth daily.    [provider]  Cholecalciferol (VITAMIN D3) 125 MCG (5000 UT) CAPS Take 10,000 Units by mouth.    [provider]  clopidogrel (PLAVIX) 75 MG tablet Take 1 tablet (75 mg total) by mouth daily. NEED OV. 12/17/19   Martinique, Peter M, MD  DEXILANT 30 MG capsule TAKE 1 CAPSULE(30 MG) BY MOUTH DAILY 02/16/20   Jearld Fenton, NP  diphenhydramine-acetaminophen (TYLENOL PM) 25-500 MG TABS Take 1 tablet by mouth at bedtime as needed (pain, sleep).    [provider]  fenofibrate micronized (LOFIBRA) 67 MG capsule Take 1 capsule (67 mg total) by mouth daily before breakfast. 02/16/20   Jearld Fenton, NP  levothyroxine (SYNTHROID) 75 MCG tablet Take 1 tablet (75 mcg total) by mouth daily. 02/16/20   Jearld Fenton, NP  metoprolol succinate (TOPROL-XL) 50 MG 24 hr tablet TAKE 1 TABLET BY MOUTH EVERY DAY WITH OR IMMEDIATELY FOLLOWING A MEAL 05/27/20   Martinique, Peter M, MD  Multiple Vitamin (MULTIVITAMIN) tablet Take 1 tablet by mouth daily.    [provider]  nitroGLYCERIN (NITROSTAT) 0.4 MG SL tablet Place 1 tablet (0.4 mg total) under the tongue every 5 (five) minutes as needed for chest pain. 07/03/19 03/29/20  [place], Peter M, MD  promethazine (PHENERGAN) 25 MG tablet Take 1 tablet (25 mg total) by mouth every 6 (six) hours as needed for nausea or vomiting. 09/21/20   Allwardt, Randa Evens, PA-C  rosuvastatin (CRESTOR) 40 MG tablet Take 1 tablet (40 mg total) by mouth daily. 09/29/20 09/24/21   Martinique, Peter M, MD  Zinc 100 MG TABS Take 100 mg by mouth daily.    [provider]    Family History Family History  Problem Relation Age of Onset   Emphysema Mother        smoked   Asthma Mother    Breast cancer Mother    Heart disease Father    Heart disease Paternal Grandfather     Social History Social History   Tobacco Use   Smoking status: Every Day    Packs/day: 1.00    Years: 15.00  Pack years: 15.00    Types: Cigarettes   Smokeless tobacco: Never  Vaping Use   Vaping Use: Never used  Substance Use Topics   Alcohol use: No   Drug use: Not Currently    Types: Marijuana    Comment: "as a teen"     Allergies   Ranitidine, Cimetidine, Sulfamethoxazole-trimethoprim, and Sulfa antibiotics   Review of Systems Review of Systems  Constitutional:  Positive for chills, diaphoresis and fever.  Respiratory:  Negative for shortness of breath.   Cardiovascular:  Negative for chest pain.  Gastrointestinal:  Positive for abdominal pain, nausea and vomiting.  Genitourinary:  Positive for dysuria and frequency.  Musculoskeletal:  Positive for back pain.    Physical Exam Triage Vital Signs ED Triage Vitals  Enc Vitals Group     BP 02/28/21 0829 (!) 128/92     Pulse Rate 02/28/21 0829 94     Resp 02/28/21 0829 18     Temp 02/28/21 0829 99 F (37.2 C)     Temp Source 02/28/21 0829 Oral     SpO2 02/28/21 0829 94 %     Weight --      Height --      Head Circumference --      Peak Flow --      Pain Score 02/28/21 0828 5     Pain Loc --      Pain Edu? --      Excl. in [place]? --    No data found.  Updated Vital Signs BP (!) 126/92 (BP Location: Right Arm)   Pulse 94   Temp 99 F (37.2 C) (Oral)   Resp 18   SpO2 94%   Visual Acuity Right Eye Distance:   Left Eye Distance:   Bilateral Distance:    Right Eye Near:   Left Eye Near:    Bilateral Near:     Physical Exam Vitals and nursing note reviewed.  Constitutional:      General: She is  not in acute distress.    Appearance: Normal appearance. She is not ill-appearing.  HENT:     Head: Normocephalic and atraumatic.     Nose: Nose normal.  Cardiovascular:     Rate and Rhythm: Normal rate and regular rhythm.     Heart sounds: Normal heart sounds. No murmur heard. Pulmonary:     Effort: Pulmonary effort is normal. No respiratory distress.     Breath sounds: Normal breath sounds. No wheezing, rhonchi or rales.  Abdominal:     General: Abdomen is flat. Bowel sounds are normal. There is no distension.     Palpations: Abdomen is soft.     Tenderness: There is abdominal tenderness (diffuse mild TTP). There is right CVA tenderness. There is no left CVA tenderness or guarding.  Skin:    General: Skin is warm and dry.  Neurological:     Mental Status: She is alert.  Psychiatric:        Mood and Affect: Mood normal.        Thought Content: Thought content normal.     UC Treatments / Results  Labs (all labs ordered are listed, but only abnormal results are displayed) Labs Reviewed  POCT URINALYSIS DIPSTICK, ED / UC - Abnormal; Notable for the following components:      Result Value   Hgb urine dipstick MODERATE (*)    Nitrite POSITIVE (*)    Leukocytes,Ua LARGE (*)    All other components within normal limits  URINE CULTURE    EKG   Radiology No results found.  Procedures Procedures (including critical care time)  Medications Ordered in UC Medications - No data to display  Initial Impression / Assessment and Plan / UC Course  I have reviewed the triage vital signs and the nursing notes.  Pertinent labs & imaging results that were available during my care of the patient were reviewed by me and considered in my medical decision making (see chart for details).  Urinalysis with positive leuks as well as positive nitrites.  Will treat to cover UTI with Macrobid.  Urine culture ordered.  Recommended follow-up if no improvement of symptoms over the next 48 hours or  sooner if symptoms worsen.  Final Clinical Impressions(s) / UC Diagnoses   Final diagnoses:  Acute cystitis with hematuria     Discharge Instructions      Take antibiotic as prescribed. Will call with urine culture as result. Recommend follow up if no gradual improvement or if symptoms worsen.      ED Prescriptions     Medication Sig Dispense Auth. Provider   nitrofurantoin, macrocrystal-monohydrate, (MACROBID) 100 MG capsule Take 1 capsule (100 mg total) by mouth 2 (two) times daily. 10 capsule Francene Finders, PA-C      PDMP not reviewed this encounter.   Francene Finders, PA-C 02/28/21 [phone]

## 2021-03-02 LAB — URINE CULTURE: Culture: >=100000 — AB

## 2021-03-22 ENCOUNTER — Encounter: Payer: Medicare Other | Admitting: Nurse Practitioner

## 2021-04-18 ENCOUNTER — Other Ambulatory Visit: Payer: Self-pay | Admitting: Internal Medicine

## 2021-04-18 DIAGNOSIS — E782 Mixed hyperlipidemia: Secondary | ICD-10-CM

## 2021-04-18 DIAGNOSIS — I25708 Atherosclerosis of coronary artery bypass graft(s), unspecified, with other forms of angina pectoris: Secondary | ICD-10-CM

## 2021-04-18 NOTE — Telephone Encounter (Signed)
   Notes to clinic not a pt in this practice, please assess.

## 2021-04-19 ENCOUNTER — Other Ambulatory Visit: Payer: Self-pay | Admitting: Cardiology

## 2021-04-20 NOTE — Telephone Encounter (Signed)
We had recommended increasing Crestor to 40 mg daily on last visit with repeat labs in 3 months  Ursala Cressy [place] MD, [facility]

## 2021-05-12 ENCOUNTER — Other Ambulatory Visit: Payer: Self-pay

## 2021-05-12 DIAGNOSIS — E782 Mixed hyperlipidemia: Secondary | ICD-10-CM

## 2021-05-12 DIAGNOSIS — I25708 Atherosclerosis of coronary artery bypass graft(s), unspecified, with other forms of angina pectoris: Secondary | ICD-10-CM

## 2021-05-12 MED ORDER — ROSUVASTATIN CALCIUM 40 MG PO TABS: 40.0000 mg | ORAL_TABLET | Freq: Every day | ORAL | 3 refills | Status: DC

## 2021-05-12 NOTE — Telephone Encounter (Signed)
Crestor 40 mg refilled.Repeat lipid and hepatic panels in 3 months.Lab orders mailed.Called patient unable to leave a message no voice mail.

## 2021-05-29 ENCOUNTER — Other Ambulatory Visit: Payer: Self-pay

## 2021-05-29 MED ORDER — NITROGLYCERIN 0.4 MG SL SUBL: 0.4000 mg | SUBLINGUAL_TABLET | SUBLINGUAL | 11 refills | Status: DC | PRN

## 2021-07-16 ENCOUNTER — Other Ambulatory Visit: Payer: Self-pay | Admitting: Cardiology

## 2021-07-17 ENCOUNTER — Other Ambulatory Visit: Payer: Self-pay | Admitting: Cardiology

## 2021-08-29 ENCOUNTER — Other Ambulatory Visit: Payer: Self-pay | Admitting: Internal Medicine

## 2021-08-29 ENCOUNTER — Telehealth: Payer: Self-pay | Admitting: Cardiology

## 2021-08-29 DIAGNOSIS — E782 Mixed hyperlipidemia: Secondary | ICD-10-CM

## 2021-08-29 DIAGNOSIS — E039 Hypothyroidism, unspecified: Secondary | ICD-10-CM

## 2021-08-29 NOTE — Telephone Encounter (Signed)
Returned call to patient no answer.Unable to leave a message no voice mail. ?

## 2021-08-29 NOTE — Telephone Encounter (Signed)
? ?  Pt c/o medication issue: ? ?1. Name of Medication: DEXILANT 30 MG capsule ?fenofibrate micronized (LOFIBRA) 67 MG capsule ?levothyroxine (SYNTHROID) 75 MCG tablet   ? ? ?2. How are you currently taking this medication (dosage and times per day)?  ? ?3. Are you having a reaction (difficulty breathing--STAT)? ? ?4. What is your medication issue? Pt would like to ask Dr. Martinique if he can send refill for these prescription. She said her pcp's office in under renovation and couldn't get any refill request done right now ?

## 2021-08-30 NOTE — Telephone Encounter (Signed)
Requested medication (s) are due for refill today: yes ? ?Requested medication (s) are on the active medication list: yes ? ?Last refill:  02/16/20 #90/2 ? ?Future visit scheduled: no ? ?Notes to clinic:  Unable to refill per protocol, medication not assigned to the refill protocol. ? ? ? ?  ?Requested Prescriptions  ?Pending Prescriptions Disp Refills  ? fenofibrate micronized (LOFIBRA) 67 MG capsule [Pharmacy Med Name: FENOFIBRATE '67MG'$  CAPSULES] 90 capsule 2  ?  Sig: TAKE 1 CAPSULE(67 MG) BY MOUTH DAILY BEFORE BREAKFAST  ?  ? There is no refill protocol information for this order  ?  ? levothyroxine (SYNTHROID) 75 MCG tablet [Pharmacy Med Name: LEVOTHYROXINE 0.'075MG'$  (75MCG) TABS] 90 tablet 2  ?  Sig: TAKE 1 TABLET(75 MCG) BY MOUTH DAILY  ?  ? There is no refill protocol information for this order  ?  ? ?

## 2021-09-06 NOTE — Telephone Encounter (Signed)
Unable to reach pt or leave a message  

## 2021-09-14 NOTE — Telephone Encounter (Signed)
Attempted to call patient- unable to reach or leave message at this time.  ?

## 2021-09-19 NOTE — Telephone Encounter (Signed)
Will await return call from pt ./cy ?

## 2021-10-15 ENCOUNTER — Other Ambulatory Visit: Payer: Self-pay | Admitting: Cardiology

## 2021-11-13 NOTE — Progress Notes (Signed)
Subjective:    Patient ID: [name], female    DOB: [birth year], 66 y.o.   MRN: [id number]   CC: Establish care  HPI:  [name] is a very pleasant 66 y.o. female who presents today to establish care.  Initial concerns:  Knee Pain: Patient presents with left knee pain for a few weeks, pain worse at night and feels 'bruised.' No inciting trauma. Current symptoms include pain located medially, popping sensation, and swelling. Pain is aggravated by rising after sitting, squatting, standing, and walking.  Patient has had no prior knee problems. Evaluation to date: none. Treatment to date: avoidance of offending activity, rest, and tylenol, heat . No knee XR in past.   Feels better in a bent position.   Past medical history: HTN, COPD, CAD, IBS, GERD, hypothyroidism  Past surgical history: Past Surgical History:  Procedure Laterality Date   ABDOMINAL HYSTERECTOMY  1980's   BIOPSY STOMACH  09/25/11   "several in the last year; all negative; via endoscopy"   BREAST SURGERY     right biopsy   CHOLECYSTECTOMY  2000's   CORONARY ANGIOPLASTY WITH STENT PLACEMENT     "lots; they've all collapsed; last time dr took native vein &  turned it around & put stents in it before attaching"   CORONARY ARTERY BYPASS GRAFT  2002   CABG X2   CYSTO WITH HYDRODISTENSION  03/28/2012   Procedure: CYSTOSCOPY/HYDRODISTENSION;  Surgeon: Reece Packer, MD;  Location: [facility];  Service: Urology;  Laterality: N/A;   CYSTOSCOPY W/ RETROGRADES  03/28/2012   Procedure: CYSTOSCOPY WITH RETROGRADE PYELOGRAM;  Surgeon: Reece Packer, MD;  Location: [facility];  Service: Urology;  Laterality: Bilateral;   CYSTOSCOPY WITH INJECTION  03/28/2012   Procedure: CYSTOSCOPY WITH INJECTION;  Surgeon: Reece Packer, MD;  Location: [facility];  Service: Urology;  Laterality: N/A;  Marcaine and Pyridium   PILONIDAL CYST / SINUS EXCISION  1980's   "was wrapped around my spinal cord; Dr. Norville Haggard got almost all of it  except a tiny bit; said it was really deep; packed fatty tissue in the hole; really bothers me alot"   SALPINGOOPHORECTOMY  1980's   "2 surgeries; 1st right; then left"   TMJ ARTHROPLASTY  1980's   right side; "I've had to have it twice"   [place]      02/04/2020    3:11 PM 03/27/2018    2:41 PM 11/05/2016    9:41 AM  PHQ9 SCORE ONLY  PHQ-9 Total Score 6 0 11   PHQ-9 is 8. Used to take Effexor, no medications or therapy currently.   Family history:  Family History  Problem Relation Age of Onset   Emphysema Mother        smoked   Asthma Mother    Breast cancer Mother    Heart disease Father    Heart disease Paternal Grandfather    Social history: Smokes a little over 1/2 PPD for about 10 years.   ROS: pertinent noted in the HPI   Objective:  BP 110/80   Pulse 77   Ht 5' (1.524 m)   Wt 163 lb (73.9 kg)   SpO2 97%   BMI 31.83 kg/m   Vitals and nursing note reviewed  General: NAD, pleasant, able to participate in exam Cardiac: RRR, S1 S2 present. normal heart sounds, no murmurs. Respiratory: CTAB, normal effort, No wheezes, rales or rhonchi Abdomen: Bowel sounds present, non-tender, non-distended, no hepatosplenomegaly  Extremities: no edema or cyanosis. No knee instability, intact. Full flexion and extension with pain at extreme of extension. No crepitus. Medial tenderness to palpation and medial pain with McMurrays.  Skin: warm and dry, no rashes noted Neuro: alert, no obvious focal deficits Psych: Normal affect and mood   Assessment & Plan:    Hypertension Metop and Amlodipine, continue as well controlled today.   COPD (chronic obstructive pulmonary disease) (HCC) No inhalers; asymptomatic   Hypothyroidism Levothyroxine; repeat TSH ordered today.   Coronary artery disease CMP + Lipid panel today, on statin, fenofibrate, plavix.   IBS (irritable bowel syndrome) Stable on no medications   GERD (gastroesophageal reflux  disease) Dexilant rx sent today.   History of anemia CBC ordered today   Left knee pain Therapeutic treatment with voltaren gel and exercises at home. May need XR if worsens or remains unchanged in next couple of weeks. Can also use PRN tylenol.   Healthcare Maintenance: In need of Shingrex, mammogram. Mammogram referral sent, needs shingrex at next visit.   [name] [facility] PGY-1

## 2021-11-16 ENCOUNTER — Encounter: Payer: Self-pay | Admitting: Student

## 2021-11-16 ENCOUNTER — Ambulatory Visit (INDEPENDENT_AMBULATORY_CARE_PROVIDER_SITE_OTHER): Payer: Medicare Other | Admitting: Student

## 2021-11-16 VITALS — BP 110/80 | HR 77 | Ht 60.0 in | Wt 163.0 lb

## 2021-11-16 DIAGNOSIS — M25562 Pain in left knee: Secondary | ICD-10-CM

## 2021-11-16 DIAGNOSIS — Z862 Personal history of diseases of the blood and blood-forming organs and certain disorders involving the immune mechanism: Secondary | ICD-10-CM

## 2021-11-16 DIAGNOSIS — Z1231 Encounter for screening mammogram for malignant neoplasm of breast: Secondary | ICD-10-CM | POA: Diagnosis not present

## 2021-11-16 DIAGNOSIS — J449 Chronic obstructive pulmonary disease, unspecified: Secondary | ICD-10-CM

## 2021-11-16 DIAGNOSIS — K580 Irritable bowel syndrome with diarrhea: Secondary | ICD-10-CM | POA: Diagnosis not present

## 2021-11-16 DIAGNOSIS — I10 Essential (primary) hypertension: Secondary | ICD-10-CM | POA: Diagnosis not present

## 2021-11-16 DIAGNOSIS — I25708 Atherosclerosis of coronary artery bypass graft(s), unspecified, with other forms of angina pectoris: Secondary | ICD-10-CM

## 2021-11-16 DIAGNOSIS — K219 Gastro-esophageal reflux disease without esophagitis: Secondary | ICD-10-CM

## 2021-11-16 DIAGNOSIS — E039 Hypothyroidism, unspecified: Secondary | ICD-10-CM

## 2021-11-16 MED ORDER — DEXLANSOPRAZOLE 30 MG PO CPDR: 30.0000 mg | DELAYED_RELEASE_CAPSULE | Freq: Every day | ORAL | 2 refills | Status: DC

## 2021-11-16 MED ORDER — DICLOFENAC SODIUM 1 % EX GEL: 2.0000 g | Freq: Four times a day (QID) | CUTANEOUS | 0 refills | Status: DC

## 2021-11-16 NOTE — Assessment & Plan Note (Addendum)
No inhalers; asymptomatic

## 2021-11-16 NOTE — Assessment & Plan Note (Signed)
Stable on no medications

## 2021-11-16 NOTE — Assessment & Plan Note (Addendum)
Metop and Amlodipine, continue as well controlled today.

## 2021-11-16 NOTE — Assessment & Plan Note (Signed)
Therapeutic treatment with voltaren gel and exercises at home. May need XR if worsens or remains unchanged in next couple of weeks. Can also use PRN tylenol.

## 2021-11-16 NOTE — Assessment & Plan Note (Addendum)
Levothyroxine; repeat TSH ordered today.

## 2021-11-16 NOTE — Assessment & Plan Note (Signed)
Dexilant rx sent today.

## 2021-11-16 NOTE — Assessment & Plan Note (Signed)
CMP + Lipid panel today, on statin, fenofibrate, plavix.

## 2021-11-16 NOTE — Patient Instructions (Addendum)
It was great to see you today! Thank you for choosing [facility] for your primary care. Brendalee Matthies was seen for new patient appointment.  Today we addressed: Need for labwork  Will continue with voltaren gel for knee pain in addition to your tylenol  Continue with [place]    Orders Placed This Encounter  Procedures   MM Digital Screening    Standing Status:   Future    Standing Expiration Date:   11/17/2022    Order Specific Question:   Reason for Exam (SYMPTOM  OR DIAGNOSIS REQUIRED)    Answer:   routine healthcare    Order Specific Question:   Preferred imaging location?    Answer:   [facility]   TSH   Lipid Panel   CBC   Comprehensive metabolic panel   Meds ordered this encounter  Medications   Dexlansoprazole (DEXILANT) 30 MG capsule DR    Sig: Take 1 capsule (30 mg total) by mouth daily.    Dispense:  90 capsule    Refill:  2   diclofenac Sodium (VOLTAREN) 1 % GEL    Sig: Apply 2 g topically 4 (four) times daily.    Dispense:  100 g    Refill:  0    If you haven't already, sign up for My Chart to have easy access to your labs results, and communication with your primary care physician.  We are checking some labs today. If they are abnormal, I will call you. If they are normal, I will send you a MyChart message (if it is active) or a letter in the mail. If you do not hear about your labs in the next 2 weeks, please call the office.   You should return to our clinic Return in about 3 months (around 02/16/2022).  I recommend that you always bring your medications to each appointment as this makes it easy to ensure you are on the correct medications and helps [place] not miss refills when you need them.  Please arrive 15 minutes before your appointment to ensure smooth check in process.  We appreciate your efforts in making this happen.  Please call the clinic at [phone] if your symptoms worsen or you have any  concerns.  Thank you for allowing me to participate in your care, Marlayna Bannister [facility]

## 2021-11-16 NOTE — Assessment & Plan Note (Signed)
CBC ordered today

## 2021-11-17 LAB — COMPREHENSIVE METABOLIC PANEL
ALT: 19 IU/L (ref 0–32)
AST: 16 IU/L (ref 0–40)
Albumin/Globulin Ratio: 2 (ref 1.2–2.2)
Albumin: 4.6 g/dL (ref 3.8–4.8)
Alkaline Phosphatase: 65 IU/L (ref 44–121)
BUN/Creatinine Ratio: 21 (ref 12–28)
BUN: 17 mg/dL (ref 8–27)
Bilirubin Total: 0.2 mg/dL (ref 0.0–1.2)
CO2: 24 mmol/L (ref 20–29)
Calcium: 9 mg/dL (ref 8.7–10.3)
Chloride: 103 mmol/L (ref 96–106)
Creatinine, Ser: 0.82 mg/dL (ref 0.57–1.00)
Globulin, Total: 2.3 g/dL (ref 1.5–4.5)
Glucose: 92 mg/dL (ref 70–99)
Potassium: 4.8 mmol/L (ref 3.5–5.2)
Sodium: 141 mmol/L (ref 134–144)
Total Protein: 6.9 g/dL (ref 6.0–8.5)
eGFR: 80 mL/min/{1.73_m2} (ref >59)

## 2021-11-17 LAB — CBC
Hematocrit: 43.6 % (ref 34.0–46.6)
Hemoglobin: 15.2 g/dL (ref 11.1–15.9)
MCH: 31.9 pg (ref 26.6–33.0)
MCHC: 34.9 g/dL (ref 31.5–35.7)
MCV: 91 fL (ref 79–97)
Platelets: 299 10*3/uL (ref 150–450)
RBC: 4.77 x10E6/uL (ref 3.77–5.28)
RDW: 12.7 % (ref 11.7–15.4)
WBC: 10.4 10*3/uL (ref 3.4–10.8)

## 2021-11-17 LAB — LIPID PANEL
Chol/HDL Ratio: 3.6 ratio (ref 0.0–4.4)
Cholesterol, Total: 129 mg/dL (ref 100–199)
HDL: 36 mg/dL — ABNORMAL LOW (ref >39)
LDL Chol Calc (NIH): 62 mg/dL (ref 0–99)
Triglycerides: 186 mg/dL — ABNORMAL HIGH (ref 0–149)
VLDL Cholesterol Cal: 31 mg/dL (ref 5–40)

## 2021-11-17 LAB — TSH: TSH: 5.36 u[IU]/mL — ABNORMAL HIGH (ref 0.450–4.500)

## 2021-11-21 ENCOUNTER — Other Ambulatory Visit: Payer: Self-pay | Admitting: Student

## 2021-11-21 DIAGNOSIS — E039 Hypothyroidism, unspecified: Secondary | ICD-10-CM

## 2021-11-21 MED ORDER — LEVOTHYROXINE SODIUM 88 MCG PO TABS: 88.0000 ug | ORAL_TABLET | Freq: Every day | ORAL | 0 refills | Status: DC

## 2021-11-21 NOTE — Progress Notes (Signed)
TSH 5.360; increased Levothyroxine- will recheck in 4-6 weeks  Triglycerides 186, LDL 62, elevated ASCVD 8.3%--continue Crestor and Fenofibrate, work on diet (decreased complex carbs and sugars) and will then recheck. May need omega 3 fatty acid  CBC and CMP unremarkable  Relayed information to patient via telephone call, all questions answered

## 2021-11-28 ENCOUNTER — Encounter: Payer: Self-pay | Admitting: *Deleted

## 2021-11-28 NOTE — Progress Notes (Signed)
[name] Date of Birth: [birth year] Medical Record #[id number]  History of Present Illness: [name] is seen today for follow up CAD. She had remote coronary bypass surgery in 2002. She had prior stenting of the vein graft to the obtuse marginal vessel in 2009. She then had a drug-eluting stent placement to this same saphenous vein graft to the ramus in March of 2012. This subsequently occluded and she underwent angioplasty and drug-eluting stent to the ramus intermediate branch in September of 2012. Cardiac catheterization again in October of 2012 showed nonobstructive disease. Her last evaluation with [name] in July 2015 showed no ischemia and normal EF.  She reports she is doing better from a heart standpoint. She rarely has to use Ntg now. She has had a couple of episodes of chest soreness when lying down. Nothing with exertion. She is walking more and doing yard work. Is down to 5 cigs/day. Planning to quit.  No palpitations.  Current Outpatient Medications on File Prior to Visit  Medication Sig Dispense Refill   amLODipine (NORVASC) 5 MG tablet Take 1 tablet (5 mg total) by mouth daily. KEEP UPCOMING APPOINTMENT FOR FUTURE REFILLS. 90 tablet 0   clopidogrel (PLAVIX) 75 MG tablet TAKE 1 TABLET(75 MG) BY MOUTH DAILY 90 tablet 0   Dexlansoprazole (DEXILANT) 30 MG capsule DR Take 1 capsule (30 mg total) by mouth daily. 90 capsule 2   diclofenac Sodium (VOLTAREN) 1 % GEL Apply 2 g topically 4 (four) times daily. 100 g 0   diphenhydramine-acetaminophen (TYLENOL PM) 25-500 MG TABS Take 1 tablet by mouth at bedtime as needed (pain, sleep).     fenofibrate micronized (LOFIBRA) 67 MG capsule TAKE 1 CAPSULE(67 MG) BY MOUTH DAILY BEFORE BREAKFAST 90 capsule 2   levothyroxine (SYNTHROID) 88 MCG tablet Take 1 tablet (88 mcg total) by mouth daily before breakfast. 90 tablet 0   metoprolol succinate (TOPROL-XL) 50 MG 24 hr tablet TAKE 1 TABLET BY MOUTH EVERY DAY WITH OR IMMEDIATELY FOLLOWING A  MEAL 90 tablet 1   Multiple Vitamin (MULTIVITAMIN) tablet Take 1 tablet by mouth daily.     rosuvastatin (CRESTOR) 40 MG tablet Take 1 tablet (40 mg total) by mouth daily. 90 tablet 3   nitroGLYCERIN (NITROSTAT) 0.4 MG SL tablet Place 1 tablet (0.4 mg total) under the tongue every 5 (five) minutes as needed for chest pain. 25 tablet 11   No current facility-administered medications on file prior to visit.    Allergies  Allergen Reactions   Ranitidine Hives   Cimetidine Hives    Pt reported   Sulfamethoxazole-Trimethoprim     Patient couldn't give specific allergy   Sulfa Antibiotics Other (See Comments)    Childhood reaction    Past Medical History:  Diagnosis Date   Anemia    Anxiety    Blood transfusion    Chest pain, cardiac    Has chronic chest pain.    Complication of anesthesia    "my blood pressure will drop out on me"   Coronary artery disease    a) s/p CABG x 2 in [place] 2002. b)stenting to the SVG-OM in 2009. c)DES to the saphenous vein graft to the ramus in March 2012. d) s/p PTCA/DES to native ramus intermedius in September 2012. Last cath 03/2011 with minimal nonobstructive disease  e) indeterminant myoview 09/2011 but atypical CP & no perfusion defect in ramus intermediate distribution, EF 52%   Depression    Embolism - blood clot 2002   "in my heart  after bypass"   Enterocolitis    Gastroparesis    GERD (gastroesophageal reflux disease)    GI bleeding    Hematuria 09/25/11   "lately"   Hx: UTI (urinary tract infection)    multiple   Hyperlipidemia    Hypertension    Hypothyroidism    Migraines    h/o   PTSD (post-traumatic stress disorder)    Tobacco abuse     Past Surgical History:  Procedure Laterality Date   ABDOMINAL HYSTERECTOMY  1980's   BIOPSY STOMACH  09/25/11   "several in the last year; all negative; via endoscopy"   BREAST SURGERY     right biopsy   CHOLECYSTECTOMY  2000's   CORONARY ANGIOPLASTY WITH STENT PLACEMENT     "lots; they've  all collapsed; last time dr took native vein &  turned it around & put stents in it before attaching"   CORONARY ARTERY BYPASS GRAFT  2002   CABG X2   CYSTO WITH HYDRODISTENSION  03/28/2012   Procedure: CYSTOSCOPY/HYDRODISTENSION;  Surgeon: Reece Packer, MD;  Location: [facility];  Service: Urology;  Laterality: N/A;   CYSTOSCOPY W/ RETROGRADES  03/28/2012   Procedure: CYSTOSCOPY WITH RETROGRADE PYELOGRAM;  Surgeon: Reece Packer, MD;  Location: [facility];  Service: Urology;  Laterality: Bilateral;   CYSTOSCOPY WITH INJECTION  03/28/2012   Procedure: CYSTOSCOPY WITH INJECTION;  Surgeon: Reece Packer, MD;  Location: [facility];  Service: Urology;  Laterality: N/A;  Marcaine and Pyridium   PILONIDAL CYST / SINUS EXCISION  1980's   "was wrapped around my spinal cord; Dr. Norville Haggard got almost all of it except a tiny bit; said it was really deep; packed fatty tissue in the hole; really bothers me alot"   SALPINGOOPHORECTOMY  1980's   "2 surgeries; 1st right; then left"   TMJ ARTHROPLASTY  1980's   right side; "I've had to have it twice"   VESICOVAGINAL FISTULA CLOSURE W/ TAH  1983    Social History   Tobacco Use  Smoking Status Every Day   Packs/day: 1.00   Years: 15.00   Total pack years: 15.00   Types: Cigarettes  Smokeless Tobacco Never    Social History   Substance and Sexual Activity  Alcohol Use No    Family History  Problem Relation Age of Onset   Emphysema Mother        smoked   Asthma Mother    Breast cancer Mother    Heart disease Father    Heart disease Paternal Grandfather     Review of Systems: The review of systems is as noted in HPI.   All other systems were reviewed and are negative.  Physical Exam: BP 110/80 (BP Location: Right Arm)   Pulse 69   Ht 5' (1.524 m)   Wt 159 lb 12.8 oz (72.5 kg)   SpO2 98%   BMI 31.21 kg/m  GENERAL:  Well appearing WF in NAD HEENT:  PERRL, EOMI, sclera are clear. Oropharynx is clear. NECK:  No jugular venous  distention, carotid upstroke brisk and symmetric, no bruits, no thyromegaly or adenopathy LUNGS:  Clear to auscultation bilaterally CHEST:  Unremarkable HEART:  RRR,  PMI not displaced or sustained,S1 and S2 within normal limits, no S3, no S4: no clicks, no rubs, no murmurs ABD:  Soft, nontender. BS +, no masses or bruits. No hepatomegaly, no splenomegaly EXT:  2 + pulses throughout, no edema, no cyanosis no clubbing SKIN:  Warm and dry.  No rashes  NEURO:  Alert and oriented x 3. Cranial nerves II through XII intact. PSYCH:  Cognitively intact   LABORATORY DATA:   Lab Results  Component Value Date   WBC 10.4 11/16/2021   HGB 15.2 11/16/2021   HCT 43.6 11/16/2021   PLT 299 11/16/2021   GLUCOSE 92 11/16/2021   CHOL 129 11/16/2021   TRIG 186 (H) 11/16/2021   HDL 36 (L) 11/16/2021   LDLDIRECT 100.0 07/29/2018   LDLCALC 62 11/16/2021   ALT 19 11/16/2021   AST 16 11/16/2021   NA 141 11/16/2021   K 4.8 11/16/2021   CL 103 11/16/2021   CREATININE 0.82 11/16/2021   BUN 17 11/16/2021   CO2 24 11/16/2021   TSH 5.360 (H) 11/16/2021   INR 1.07 09/25/2011   HGBA1C 5.8 11/05/2016   Ecg today shows NSR with low voltage. Rate 69. I have personally reviewed and interpreted this study.  Assessment / Plan: 1. Coronary disease with previous bypass and multiple interventions as noted above. She has chronic and atypical chest pain. Actually doing very well today.  Continue  Plavix, amlodipine, Toprol XL and Crestor.  2. Hypertension, well controlled on medication.  3. Tobacco abuse.  Encourage complete cessation.   4. Hyperlipidemia. LDL is 62.  on Crestor and fenofibrate.  5. COPD  6. Hypothyroidism. Followed by primary care.   7. Tobacco abuse. Encourage complete smoking cessation.   Follow up in one year

## 2021-12-01 ENCOUNTER — Encounter: Payer: Self-pay | Admitting: Cardiology

## 2021-12-01 ENCOUNTER — Ambulatory Visit (INDEPENDENT_AMBULATORY_CARE_PROVIDER_SITE_OTHER): Payer: Medicare Other | Admitting: Cardiology

## 2021-12-01 VITALS — BP 110/80 | HR 69 | Ht 60.0 in | Wt 159.8 lb

## 2021-12-01 DIAGNOSIS — I25708 Atherosclerosis of coronary artery bypass graft(s), unspecified, with other forms of angina pectoris: Secondary | ICD-10-CM

## 2021-12-01 DIAGNOSIS — E782 Mixed hyperlipidemia: Secondary | ICD-10-CM | POA: Diagnosis not present

## 2021-12-01 DIAGNOSIS — I10 Essential (primary) hypertension: Secondary | ICD-10-CM | POA: Diagnosis not present

## 2021-12-01 MED ORDER — AMLODIPINE BESYLATE 5 MG PO TABS: 5.0000 mg | ORAL_TABLET | Freq: Every day | ORAL | 3 refills | Status: DC

## 2021-12-01 MED ORDER — CLOPIDOGREL BISULFATE 75 MG PO TABS: 75.0000 mg | ORAL_TABLET | Freq: Every day | ORAL | 3 refills | Status: DC

## 2021-12-01 NOTE — Patient Instructions (Signed)
Medication Instructions:  Your physician recommends that you continue on your current medications as directed. Please refer to the Current Medication list given to you today.  *If you need a refill on your cardiac medications before your next appointment, please call your pharmacy*   Lab Work: None If you have labs (blood work) drawn today and your tests are completely normal, you will receive your results only by: [place] (if you have MyChart) OR A paper copy in the mail If you have any lab test that is abnormal or we need to change your treatment, we will call you to review the results.   Testing/Procedures: None   Follow-Up: At [facility], you and your health needs are our priority.  As part of our continuing mission to provide you with exceptional heart care, we have created designated Provider Care Teams.  These Care Teams include your primary Cardiologist (physician) and Advanced Practice Providers (APPs -  Physician Assistants and Nurse Practitioners) who all work together to provide you with the care you need, when you need it.  We recommend signing up for the patient portal called "MyChart".  Sign up information is provided on this After Visit Summary.  MyChart is used to connect with patients for Virtual Visits (Telemedicine).  Patients are able to view lab/test results, encounter notes, upcoming appointments, etc.  Non-urgent messages can be sent to your provider as well.   To learn more about what you can do with MyChart, go to [email or web address].    Your next appointment:   1 year(s)  The format for your next appointment:   In Person  Provider:   Peter Martinique, MD    Other Instructions   Important Information About Sugar

## 2021-12-07 ENCOUNTER — Ambulatory Visit
Admission: RE | Admit: 2021-12-07 | Discharge: 2021-12-07 | Disposition: A | Discharge status: Home / Self Care | Payer: Medicare Other | Source: Ambulatory Visit | Attending: Family Medicine | Admitting: Family Medicine

## 2021-12-07 DIAGNOSIS — Z1231 Encounter for screening mammogram for malignant neoplasm of breast: Secondary | ICD-10-CM

## 2022-01-08 NOTE — Progress Notes (Unsigned)
     SUBJECTIVE:   CHIEF COMPLAINT / HPI:   [name] is a 66 y.o. female presents for headache   Headache Patient reports she is starting to wake up with headaches 3 weeks ago.  No acute life stressors.  Onset: 3 weeks ago Location: occipital radiates to left temple  Quality: sharp  Frequency: daily  Precipitating factors: wakes up with headache, lipoma on left side of her neck Prior treatment: eases off with tylenol , warm compresses Severity: 4/10 -8/10  Associated Symptoms Nausea/vomiting: no  Photophobia/phonophobia: no  Tearing of eyes: no  Sinus pain/pressure: no  Family hx migraine: yes  Personal stressors: no  Relation to menstrual cycle: no    Red Flags Fever: no  Neck pain/stiffness: yes  Vision/speech/swallow/hearing difficulty: no  Focal weakness/numbness: no  Altered mental status: no  Trauma: no  New type of headache: yes  Anticoagulant use: no  H/o cancer/HIV/Pregnancy: no   Neck mass  [place] Office Visit from 01/09/2022 in [place]  PHQ-9 Total Score 6        Health Maintenance Due  Topic   Zoster Vaccines- Shingrix (1 of 2)     PERTINENT  PMH / PSH: CAD, COPD, GERD  OBJECTIVE:   BP (!) 102/53   Pulse 84   Ht 5' (1.524 m)   Wt 160 lb 9.6 oz (72.8 kg)   SpO2 97%   BMI 31.37 kg/m    General: Alert, no acute distress Cardio: well perfused  Pulm: normal work of breathing Neuro: Cranial nerve exam normal, PERRLA 91m bilaterally  MSK: Mild TTP C-spine and paraspinal muscles.  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  Patient would like it removed as it has been there since 2005 and slowly growing.  It is likely contributing to muscular pain in this area and causing referred pain to the occipital area of her head.  Checked with Dr. EGwendlyn Deutscherto see if lipoma could be excised in clinic however she recommended surgical referral.   I have placed referral to surgery.  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  Low suspicion for subarachnoid hemorrhage as lacks the classic signs.  No red flags of headache.  Recommend: -Tylenol 500-650 mg every 4-6 hours, recommended against taking more than '1000mg'$  at a time as this exceeds the recommended dose.  Also recommended against ibuprofen use due to age and CAD. -Warm compresses -Neck exercises  -Diclofenac gel to affect areas -Treatment of lipoma, see below -Consider repeat C-spine (x-ray/MRI)0 imaging and PT if no improvement in symptoms -Recommended that if headache worsens in severity or changes in character or if she has associated neurological symptoms with the headache she should go to the ED immediately.  Patient expressed understanding and is happy with the plan.    PLattie Haw MD PGY-3 [place]

## 2022-01-09 ENCOUNTER — Ambulatory Visit (INDEPENDENT_AMBULATORY_CARE_PROVIDER_SITE_OTHER): Payer: Medicare Other | Admitting: Family Medicine

## 2022-01-09 ENCOUNTER — Encounter: Payer: Self-pay | Admitting: Family Medicine

## 2022-01-09 DIAGNOSIS — G44209 Tension-type headache, unspecified, not intractable: Secondary | ICD-10-CM | POA: Diagnosis not present

## 2022-01-09 DIAGNOSIS — D170 Benign lipomatous neoplasm of skin and subcutaneous tissue of head, face and neck: Secondary | ICD-10-CM

## 2022-01-09 DIAGNOSIS — D1722 Benign lipomatous neoplasm of skin and subcutaneous tissue of left arm: Secondary | ICD-10-CM | POA: Insufficient documentation

## 2022-01-09 MED ORDER — BACLOFEN 10 MG PO TABS: 10.0000 mg | ORAL_TABLET | Freq: Three times a day (TID) | ORAL | 0 refills | Status: DC

## 2022-01-09 NOTE — Assessment & Plan Note (Addendum)
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  Low suspicion for subarachnoid hemorrhage as lacks the classic signs.  No red flags of headache.  Recommend: -Tylenol 500-650 mg every 4-6 hours, recommended against taking more than '1000mg'$  at a time as this exceeds the recommended dose.  Also recommended against ibuprofen use due to age and CAD. -Warm compresses -Neck exercises  -Diclofenac gel to affect areas -Treatment of lipoma, see below -Consider repeat C-spine (x-ray/MRI)0 imaging and PT if no improvement in symptoms -Recommended that if headache worsens in severity or changes in character or if she has associated neurological symptoms with the headache she should go to the ED immediately.  Patient expressed understanding and is happy with the plan.

## 2022-01-09 NOTE — Assessment & Plan Note (Addendum)
Symptomatic 6 cm lipoma just below neck.  Patient would like it removed as it has been there since 2005 and slowly growing.  It is likely contributing to muscular pain in this area and causing referred pain to the occipital area of her head.  Checked with Dr. Gwendlyn Deutscher to see if lipoma could be excised in clinic however she recommended surgical referral.  I have placed referral to surgery.

## 2022-01-09 NOTE — Patient Instructions (Signed)
Thank you for coming to see me today. It was a pleasure. Today we discussed your headaches.  I think the cause is a combination of neck arthritis plus the lipoma causing strain on the left side of the neck.  I recommend a muscle relaxer called baclofen, take Tylenol 500-650 mg every 4-6 hours.  Do not take more than 1000 mg at 1 time.  Continue heat/ice and diclofenac gel to the area.  Do some gentle neck stretches multiple times a day.  If the headache worsens or changes in character plus vision changes, headaches, speech changes, arm or leg weakness or numbness please go to the ER immediately  Please follow-up with [place] in dermatology clinic once we get approval for the procedure to remove the lipoma  If you have any questions or concerns, please do not hesitate to call the office at [phone].  Best wishes,   Dr Posey Pronto

## 2022-02-14 ENCOUNTER — Other Ambulatory Visit: Payer: Self-pay | Admitting: Student

## 2022-02-14 DIAGNOSIS — E039 Hypothyroidism, unspecified: Secondary | ICD-10-CM

## 2022-02-16 ENCOUNTER — Telehealth: Payer: Self-pay

## 2022-02-16 NOTE — Telephone Encounter (Signed)
-----   Message from Gerrit Heck, MD sent at 02/15/2022  8:20 AM EDT ----- Patient needs to be scheduled for office visit for TSH recheck before filling longer levothyroxine prescription. Thank you :)  [place]

## 2022-02-16 NOTE — Telephone Encounter (Signed)
Scheduled pt for office visit on 09/19 '@1'$ :55 pm with Dr.Maxwell

## 2022-02-19 ENCOUNTER — Other Ambulatory Visit: Payer: Self-pay | Admitting: Student

## 2022-02-19 DIAGNOSIS — E039 Hypothyroidism, unspecified: Secondary | ICD-10-CM

## 2022-03-13 ENCOUNTER — Ambulatory Visit: Payer: Medicare Other | Admitting: Student

## 2022-03-20 ENCOUNTER — Other Ambulatory Visit: Payer: Self-pay | Admitting: Student

## 2022-03-20 DIAGNOSIS — E039 Hypothyroidism, unspecified: Secondary | ICD-10-CM

## 2022-03-26 ENCOUNTER — Encounter: Payer: Self-pay | Admitting: Student

## 2022-03-26 ENCOUNTER — Ambulatory Visit (INDEPENDENT_AMBULATORY_CARE_PROVIDER_SITE_OTHER): Payer: Medicare Other | Admitting: Student

## 2022-03-26 VITALS — BP 123/82 | HR 90 | Ht 60.0 in | Wt 157.0 lb

## 2022-03-26 DIAGNOSIS — E039 Hypothyroidism, unspecified: Secondary | ICD-10-CM

## 2022-03-26 DIAGNOSIS — R051 Acute cough: Secondary | ICD-10-CM | POA: Diagnosis not present

## 2022-03-26 DIAGNOSIS — E782 Mixed hyperlipidemia: Secondary | ICD-10-CM | POA: Diagnosis not present

## 2022-03-26 MED ORDER — BENZONATATE 100 MG PO CAPS: 100.0000 mg | ORAL_CAPSULE | Freq: Two times a day (BID) | ORAL | 0 refills | Status: DC | PRN

## 2022-03-26 MED ORDER — LEVOTHYROXINE SODIUM 88 MCG PO TABS: 88.0000 ug | ORAL_TABLET | Freq: Every day | ORAL | 0 refills | Status: DC

## 2022-03-26 MED ORDER — FENOFIBRATE 67 MG PO CAPS: ORAL_CAPSULE | ORAL | 2 refills | Status: DC

## 2022-03-26 NOTE — Progress Notes (Signed)
SUBJECTIVE:   CHIEF COMPLAINT / HPI:   TSH recheck:  Last TSH elevated, required increase in medication.   Cough  Cough for about 12 days  Fever for two days, 101 but has not been febrile for 10 days Chills in the beginning of her illness but not recently  She also has a sore throat intermittently   She recently buried her sister in law and many of her family members were sick with an illness that seems to have passed along to her.  She reports that her son was diagnosed with bronchitis after the burial.  Patient adamantly denies shortness of breath and has not needed inhalers.   PERTINENT  PMH / PSH:   Past Medical History:  Diagnosis Date   Anemia    Anxiety    Blood transfusion    Chest pain, cardiac    Has chronic chest pain.    Complication of anesthesia    "my blood pressure will drop out on me"   Coronary artery disease    a) s/p CABG x 2 in [place] 2002. b)stenting to the SVG-OM in 2009. c)DES to the saphenous vein graft to the ramus in March 2012. d) s/p PTCA/DES to native ramus intermedius in September 2012. 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"   Enterocolitis    Gastroparesis    GERD (gastroesophageal reflux disease)    GI bleeding    Hematuria 09/25/11   "lately"   Hx: UTI (urinary tract infection)    multiple   Hyperlipidemia    Hypertension    Hypothyroidism    Migraines    h/o   PTSD (post-traumatic stress disorder)    Tobacco abuse     OBJECTIVE:  BP 123/82   Pulse 90   Ht 5' (1.524 m)   Wt 157 lb (71.2 kg)   SpO2 100%   BMI 30.66 kg/m   General: NAD, pleasant, able to participate in exam Cardiac: RRR, no murmurs auscultated Respiratory: No wheezing without increased WOB, no focal diminishment on exam  Abdomen: soft, non-tender,  non-distended, normoactive bowel sounds Extremities: warm and well perfused, no edema or cyanosis Skin: warm and dry, no rashes noted Neuro: alert, no obvious focal deficits, speech normal Psych: Tearful intermittently but appropriate   ASSESSMENT/PLAN:  Acute cough Assessment & Plan: Likely with viral illness usually self-limiting. Acute cough could also be caused by an exacerbation of an upper airway cough syndrome secondary to rhinosinusitis, asthma, COPD, or pneumonia. Unlikely PNA with no focal diminishment on exam or systemic symptoms. No history of COPD or asthma although she is a smoker. No signs of GERD (treat with PPI), or rhinosinusitis. Honey can provide symptomatic relief, can also try humidifier at home, Tylenol/Ibuprofen as needed. If symptoms remain in the next couple of weeks or worsen, patient was instructed to return.   Orders: -     Benzonatate; Take 1 capsule (100 mg total) by mouth 2 (two) times daily as needed for cough.  Dispense: 20 capsule; Refill: 0  Hypothyroidism (acquired) -     TSH Rfx on Abnormal to Free T4 -     Levothyroxine Sodium; Take 1 tablet (88 mcg total) by mouth daily before breakfast.  Dispense: 90 tablet; Refill: 0  Mixed hyperlipidemia Assessment & Plan: Refill for fenofibrate  on request  Orders: -     Fenofibrate; TAKE 1 CAPSULE(67 MG) BY MOUTH DAILY BEFORE BREAKFAST  Dispense: 90 capsule; Refill: 2  Hypothyroidism, unspecified type Assessment & Plan: Recheck TSH after increasing Levothyroxine.     Patient is dealing with a very difficult period of her life and is going through many changes with family. Will continue to monitor her mood.   Return in about 2 weeks (around 04/09/2022) for cough if persistent . Erskine Emery, MD 03/27/2022, 8:47 PM        PGY-2, [place]

## 2022-03-26 NOTE — Patient Instructions (Addendum)
It was great to see you today! Thank you for choosing [facility] for your primary care. [name] was seen for follow up.  Today we addressed: I will call about your lab results    This is most likely a viral infection. This will take time to get over. The treatment for this is supportive care. You can alternate Tylenol and Ibuprofen for pain or fever every 3 hours (there should be 6 hours in between each dose of Tylenol, and 6 hours in between doses of Ibuprofen). You can give a teaspoon of honey by itself or mixed with water to help cough. Steam baths, Vicks vapor rub, a humidifier and nasal saline spray can help with congestion.   It is important to keep hydrated throughout this time!  Frequent hand washing to prevent recurrent illnesses is important.   Please come back for recurrent symptoms that are not improving in 2 weeks, unable to keep fluids down, or any concerning symptoms to you.    We are checking some labs today. If they are abnormal, I will call you. If they are normal, I will send you a MyChart message (if it is active) or a letter in the mail. If you do not hear about your labs in the next 2 weeks, please call the office. I recommend that you always bring your medications to each appointment as this makes it easy to ensure you are on the correct medications and helps [place] not miss refills when you need them. Call the clinic at [phone] if your symptoms worsen or you have any concerns.  You should return to our clinic Return in about 2 weeks (around 04/09/2022) for cough if persistent . Please arrive 15 minutes before your appointment to ensure smooth check in process.  We appreciate your efforts in making this happen.  Thank you for allowing me to participate in your care, Erskine Emery, MD 03/26/2022, 1:36 PM PGY-2, [place]

## 2022-03-27 LAB — TSH RFX ON ABNORMAL TO FREE T4: TSH: 4.16 u[IU]/mL (ref 0.450–4.500)

## 2022-03-27 NOTE — Assessment & Plan Note (Signed)
Recheck TSH after increasing Levothyroxine.

## 2022-03-27 NOTE — Assessment & Plan Note (Signed)
Likely with viral illness usually self-limiting. Acute cough could also be caused by an exacerbation of an upper airway cough syndrome secondary to rhinosinusitis, asthma, COPD, or pneumonia. Unlikely PNA with no focal diminishment on exam or systemic symptoms. No history of COPD or asthma although she is a smoker. No signs of GERD (treat with PPI), or rhinosinusitis. Honey can provide symptomatic relief, can also try humidifier at home, Tylenol/Ibuprofen as needed. If symptoms remain in the next couple of weeks or worsen, patient was instructed to return.

## 2022-03-27 NOTE — Assessment & Plan Note (Signed)
Refill for fenofibrate on request

## 2022-04-13 ENCOUNTER — Other Ambulatory Visit: Payer: Self-pay | Admitting: Cardiology

## 2022-05-02 ENCOUNTER — Encounter (HOSPITAL_COMMUNITY): Payer: Self-pay

## 2022-05-02 ENCOUNTER — Other Ambulatory Visit: Payer: Self-pay

## 2022-05-02 ENCOUNTER — Ambulatory Visit (INDEPENDENT_AMBULATORY_CARE_PROVIDER_SITE_OTHER): Payer: Medicare Other

## 2022-05-02 ENCOUNTER — Ambulatory Visit (HOSPITAL_COMMUNITY)
Admission: EM | Admit: 2022-05-02 | Discharge: 2022-05-02 | Disposition: A | Discharge status: Home / Self Care | Payer: Medicare Other | Attending: Physician Assistant | Admitting: Physician Assistant

## 2022-05-02 DIAGNOSIS — M545 Low back pain, unspecified: Secondary | ICD-10-CM | POA: Diagnosis not present

## 2022-05-02 DIAGNOSIS — M5441 Lumbago with sciatica, right side: Secondary | ICD-10-CM | POA: Diagnosis not present

## 2022-05-02 DIAGNOSIS — M25562 Pain in left knee: Secondary | ICD-10-CM | POA: Diagnosis not present

## 2022-05-02 MED ORDER — METHOCARBAMOL 500 MG PO TABS: 500.0000 mg | ORAL_TABLET | Freq: Two times a day (BID) | ORAL | 0 refills | Status: DC

## 2022-05-02 MED ORDER — DICLOFENAC SODIUM 1 % EX GEL: 2.0000 g | Freq: Four times a day (QID) | CUTANEOUS | 0 refills | Status: DC

## 2022-05-02 MED ORDER — TIZANIDINE HCL 4 MG PO TABS: 4.0000 mg | ORAL_TABLET | Freq: Three times a day (TID) | ORAL | 0 refills | Status: DC | PRN

## 2022-05-02 NOTE — ED Provider Notes (Addendum)
[place]    CSN: [id number] Arrival date & time: 05/02/22  7253      History   Chief Complaint Chief Complaint  Patient presents with   Back Pain    HPI [name] is a 66 y.o. female.   Patient presents today with a 24-hour history of midline lumbar back pain.  Reports that she felt a sudden pain when she was putting her clothes on yesterday.  Denies any known injury or change in activity prior to symptom onset.  She does have a history of intermittent back pain but reports this is the worst episode.  Pain is rated 10 on a 0-10 pain scale, described as sharp, no aggravating or alleviating factors notified.  She has tried lidocaine patches and Tylenol without improvement.  Reports pain has started to travel to her right hip.  Denies any bowel/bladder incontinence, lower extremity weakness, saddle anesthesia.  She is having difficulty with daily duties as result of symptoms.  Denies history of malignancy.    Past Medical History:  Diagnosis Date   Anemia    Anxiety    Blood transfusion    Chest pain, cardiac    Has chronic chest pain.    Complication of anesthesia    "my blood pressure will drop out on me"   Coronary artery disease    a) s/p CABG x 2 in [place] 2002. b)stenting to the SVG-OM in 2009. c)DES to the saphenous vein graft to the ramus in March 2012. d) s/p PTCA/DES to native ramus intermedius in September 2012. 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"   Enterocolitis    Gastroparesis    GERD (gastroesophageal reflux disease)    GI bleeding    Hematuria 09/25/11   "lately"   Hx: UTI (urinary tract infection)    multiple   Hyperlipidemia    Hypertension    Hypothyroidism    Migraines    h/o   PTSD (post-traumatic stress disorder)    Tobacco abuse      Patient Active Problem List   Diagnosis Date Noted   Lipoma of neck 01/09/2022   Left knee pain 11/16/2021   History of anemia 11/06/2016   IBS (irritable bowel syndrome) 11/06/2016   Acute cough 04/18/2014   Headache 01/04/2014   GERD (gastroesophageal reflux disease) 08/18/2013   Anxiety and depression 11/01/2010   Coronary artery disease    Hypertension    Hyperlipidemia    Hypothyroidism     Past Surgical History:  Procedure Laterality Date   ABDOMINAL HYSTERECTOMY  02/24/1979   BIOPSY STOMACH  09/25/2011   "several in the last year; all negative; via endoscopy"   BREAST EXCISIONAL BIOPSY Right    BREAST SURGERY     right biopsy   CHOLECYSTECTOMY  02/24/1999   CORONARY ANGIOPLASTY WITH STENT PLACEMENT     "lots; they've all collapsed; last time dr took native vein &  turned it around & put stents in it before attaching"   CORONARY ARTERY BYPASS GRAFT  06/25/2000   CABG X2   CYSTO WITH HYDRODISTENSION  03/28/2012   Procedure: CYSTOSCOPY/HYDRODISTENSION;  Surgeon: Reece Packer, MD;  Location: [facility];  Service: Urology;  Laterality: N/A;   CYSTOSCOPY W/ RETROGRADES  03/28/2012   Procedure: CYSTOSCOPY WITH RETROGRADE PYELOGRAM;  Surgeon: Reece Packer, MD;  Location: [facility];  Service: Urology;  Laterality: Bilateral;   CYSTOSCOPY WITH INJECTION  03/28/2012   Procedure: CYSTOSCOPY WITH INJECTION;  Surgeon: Reece Packer, MD;  Location: [facility];  Service: Urology;  Laterality: N/A;  Marcaine and Pyridium   PILONIDAL CYST / SINUS EXCISION  02/24/1979   "was wrapped around my spinal cord; Dr. Norville Haggard got almost all of it except a tiny bit; said it was really deep; packed fatty tissue in the hole; really bothers me alot"   SALPINGOOPHORECTOMY  02/24/1979   "2 surgeries; 1st right; then left"   TMJ ARTHROPLASTY  02/24/1979   right side; "I've had to have it twice"   VESICOVAGINAL FISTULA CLOSURE W/ TAH  06/25/1981    OB History   No obstetric history on  file.      Home Medications    Prior to Admission medications   Medication Sig Start Date End Date Taking? Authorizing Provider  methocarbamol (ROBAXIN) 500 MG tablet Take 1 tablet (500 mg total) by mouth 2 (two) times daily. 05/02/22  Yes Kennth Vanbenschoten K, PA-C  amLODipine (NORVASC) 5 MG tablet Take 1 tablet (5 mg total) by mouth daily. 12/01/21   Martinique, Peter M, MD  clopidogrel (PLAVIX) 75 MG tablet Take 1 tablet (75 mg total) by mouth daily. 12/01/21   [place], Peter M, MD  Dexlansoprazole (DEXILANT) 30 MG capsule DR Take 1 capsule (30 mg total) by mouth daily. 11/16/21   Erskine Emery, MD  diclofenac Sodium (VOLTAREN) 1 % GEL Apply 2 g topically 4 (four) times daily. 05/02/22   Ranelle Auker K, PA-C  diphenhydramine-acetaminophen (TYLENOL PM) 25-500 MG TABS Take 1 tablet by mouth at bedtime as needed (pain, sleep).    [provider]  fenofibrate micronized (LOFIBRA) 67 MG capsule TAKE 1 CAPSULE(67 MG) BY MOUTH DAILY BEFORE BREAKFAST 03/26/22   Erskine Emery, MD  levothyroxine (SYNTHROID) 88 MCG tablet Take 1 tablet (88 mcg total) by mouth daily before breakfast. 03/26/22 06/24/22  Erskine Emery, MD  metoprolol succinate (TOPROL-XL) 50 MG 24 hr tablet TAKE 1 TABLET BY MOUTH EVERY DAY WITH OR IMMEDIATELY FOLLOWING A MEAL 04/13/22   Martinique, Peter M, MD  Multiple Vitamin (MULTIVITAMIN) tablet Take 1 tablet by mouth daily.    [provider]  nitroGLYCERIN (NITROSTAT) 0.4 MG SL tablet Place 1 tablet (0.4 mg total) under the tongue every 5 (five) minutes as needed for chest pain. 05/29/21 08/27/21  [place], Peter M, MD  rosuvastatin (CRESTOR) 40 MG tablet Take 1 tablet (40 mg total) by mouth daily. 05/12/21 05/07/22  [place], Peter M, MD    Family History Family History  Problem Relation Age of Onset   Emphysema Mother        smoked   Asthma Mother    Breast cancer Mother    Heart disease Father    Heart disease Paternal Grandfather     Social History Social History   Tobacco  Use   Smoking status: Every Day    Packs/day: 1.00    Years: 15.00    Total pack years: 15.00    Types: Cigarettes   Smokeless tobacco: Never  Vaping Use   Vaping Use: Never used  Substance Use Topics   Alcohol use: No   Drug use: Not Currently    Types: Marijuana    Comment: "as a teen"     Allergies   Ranitidine, Cimetidine, Sulfamethoxazole-trimethoprim, and Sulfa antibiotics   Review of Systems Review of  Systems  Constitutional:  Positive for activity change. Negative for appetite change, fatigue and fever.  Genitourinary:  Negative for difficulty urinating, dysuria, frequency and urgency.  Musculoskeletal:  Positive for back pain. Negative for arthralgias and myalgias.  Neurological:  Negative for dizziness, weakness, light-headedness, numbness and headaches.     Physical Exam Triage Vital Signs ED Triage Vitals  Enc Vitals Group     BP 05/02/22 0953 118/71     Pulse Rate 05/02/22 0953 67     Resp 05/02/22 0953 12     Temp 05/02/22 0953 98.6 F (37 C)     Temp Source 05/02/22 0953 Oral     SpO2 05/02/22 0953 98 %     Weight --      Height --      Head Circumference --      Peak Flow --      Pain Score 05/02/22 0952 10     Pain Loc --      Pain Edu? --      Excl. in [place]? --    No data found.  Updated Vital Signs BP 118/71 (BP Location: Right Arm)   Pulse 67   Temp 98.6 F (37 C) (Oral)   Resp 12   SpO2 98%   Visual Acuity Right Eye Distance:   Left Eye Distance:   Bilateral Distance:    Right Eye Near:   Left Eye Near:    Bilateral Near:     Physical Exam Vitals reviewed.  Constitutional:      General: She is awake. She is not in acute distress.    Appearance: Normal appearance. She is well-developed. She is not ill-appearing.     Comments: Very pleasant female appears stated age in no acute distress sitting comfortably in exam room  HENT:     Head: Normocephalic and atraumatic.  Cardiovascular:     Rate and Rhythm: Normal rate and  regular rhythm.     Heart sounds: Normal heart sounds, S1 normal and S2 normal. No murmur heard. Pulmonary:     Effort: Pulmonary effort is normal.     Breath sounds: Normal breath sounds. No wheezing, rhonchi or rales.     Comments: Clear to auscultation bilaterally Abdominal:     Palpations: Abdomen is soft.     Tenderness: There is no abdominal tenderness. There is no right CVA tenderness, left CVA tenderness, guarding or rebound.  Musculoskeletal:     Cervical back: No tenderness or bony tenderness.     Thoracic back: Tenderness present. No bony tenderness.     Lumbar back: Tenderness and bony tenderness present. Decreased range of motion. Negative right straight leg raise test and negative left straight leg raise test.     Comments: Back: Pain percussion of lumbar vertebrae.  No deformity or step-off noted.  Tenderness palpation of the left lumbar and thoracic paraspinal muscles.  Negative straight leg raise bilaterally.  Negative Faber bilaterally.  Strength 5/5 bilateral lower extremities.  Psychiatric:        Behavior: Behavior is cooperative.      UC Treatments / Results  Labs (all labs ordered are listed, but only abnormal results are displayed) Labs Reviewed - No data to display  EKG   Radiology DG Lumbar Spine Complete  Result Date: 05/02/2022 CLINICAL DATA:  Acute low back pain. EXAM: LUMBAR SPINE - COMPLETE 4+ VIEW COMPARISON:  CT abdomen pelvis dated October 05, 2011. FINDINGS: Five lumbar type vertebral bodies. No acute fracture or subluxation. Vertebral  body heights are preserved. Facet mediated 2 mm anterolisthesis at L4-L5. Mild disc height loss and left-greater-than-right facet arthropathy at L4-L5 and L5-S1. The sacroiliac joints are unremarkable. IMPRESSION: 1. No acute osseous abnormality. 2. Mild lower lumbar spondylosis. Electronically Signed   By: Titus Dubin M.D.   On: 05/02/2022 11:04    Procedures Procedures (including critical care  time)  Medications Ordered in UC Medications - No data to display  Initial Impression / Assessment and Plan / UC Course  I have reviewed the triage vital signs and the nursing notes.  Pertinent labs & imaging results that were available during my care of the patient were reviewed by me and considered in my medical decision making (see chart for details).     X-ray obtained given severity of injury with bony tenderness that showed degenerative changes without acute osseous abnormality.  Patient is unable to take NSAIDs due to history of cardiac disease so we will use topical diclofenac for pain relief.  She can continue Tylenol as well as lidocaine patches.  She was prescribed Zanaflex with instruction not to drink alcohol or drive with this medication due to associated drowsiness.  Recommended conservative treatment measures including heat, rest, stretch.  If symptoms not improving she is to follow-up with sports medicine was given contact information for local provider with instruction to call to schedule an appointment.  Discussed that if she has any worsening symptoms including increased pain, weakness, saddle anesthesia, bowel/bladder incontinence she is to go to the emergency room.  Strict return precautions given.  Work excuse note provided.  Final Clinical Impressions(s) / UC Diagnoses   Final diagnoses:  Acute midline low back pain with right-sided sciatica     Discharge Instructions      Your x-ray showed some arthritis in your spine but was otherwise normal.  Please continue Tylenol over-the-counter as well as lidocaine patches.  Use diclofenac gel up to 4 times daily for pain relief.  Take Robaxin up to twice a day.  This will make you sleepy so do not drive or drink alcohol with taking it.  Use heat and gentle stretch for symptom relief.  If your symptoms are not improving quickly please follow-up with sports medicine; call to schedule appointment.  If you have any increased pain,  decreased range of motion, going to the bathroom on yourself without noticing it, numbness on the inside of your legs you need to go to the emergency room.     ED Prescriptions     Medication Sig Dispense Auth. Provider   diclofenac Sodium (VOLTAREN) 1 % GEL Apply 2 g topically 4 (four) times daily. 100 g Keisi Eckford K, PA-C   methocarbamol (ROBAXIN) 500 MG tablet Take 1 tablet (500 mg total) by mouth 2 (two) times daily. 20 tablet Avi Kerschner, Derry Skill, PA-C      PDMP not reviewed this encounter.   Terrilee Croak, PA-C 05/02/22 1112    Olene Godfrey, Derry Skill, PA-C 05/02/22 1116

## 2022-05-02 NOTE — Discharge Instructions (Signed)
Your x-ray showed some arthritis in your spine but was otherwise normal.  Please continue Tylenol over-the-counter as well as lidocaine patches.  Use diclofenac gel up to 4 times daily for pain relief.  Take Robaxin up to twice a day.  This will make you sleepy so do not drive or drink alcohol with taking it.  Use heat and gentle stretch for symptom relief.  If your symptoms are not improving quickly please follow-up with sports medicine; call to schedule appointment.  If you have any increased pain, decreased range of motion, going to the bathroom on yourself without noticing it, numbness on the inside of your legs you need to go to the emergency room.

## 2022-05-02 NOTE — ED Triage Notes (Signed)
Pt is here for back pain x1day

## 2022-05-30 ENCOUNTER — Telehealth: Payer: Self-pay | Admitting: Cardiology

## 2022-05-30 NOTE — Telephone Encounter (Signed)
Pt c/o of Chest Pain: STAT if CP now or developed within 24 hours  1. Are you having CP right now? Chest is sore - had back pain in the center of her back between shoulder blades  2. Are you experiencing any other symptoms (ex. SOB, nausea, vomiting, sweating)? Diarrhea yesterday and unable to sleep  3. How long have you been experiencing CP? Yesterday and just sore today  4. Is your CP continuous or coming and going? continuous  5. Have you taken Nitroglycerin? No but does have it.   STAT if patient feels like he/she is going to faint   Are you dizzy now? No - dizzy on Sunday evening  Do you feel faint or have you passed out? no  Do you have any other symptoms? Felt like she was going to pass out and her right ear starting ringing really bad. States that she took her BP and it was 180/90. Her son came inside and she told him what was going on and he gave her half of his Clonidine and she felt better.   Have you checked your HR and BP (record if available)? 180/90something on Sunday and today is 135/82. ?

## 2022-05-30 NOTE — Telephone Encounter (Signed)
Pt call to [employer], transferred to [employer] on [address]. Per message below.    Pt stated he symptoms started Sunday evening, 05/27/2022.  Pt states she had ringing in her ear, and she sat down in a chair.  Pt states she began having back pain between her shoulder blades, then mid chest pain.    Pt called her son, and took her own BP and HR.  Per Pt, BP 180/85 and HR 96.  Pt states this is higher than what she normally runs.  Pt took half tablet, of her sons clonidine prescription, and she states her Bp came down.    Pt also states she did not get any sleep that night, or the past few nights since symptoms started.  Pt states her back pain / chest pain has continued, and stated she had a double bypass in 2002.    Pt took BP and HR prior to call [facility];  Her BP was 135/80 and HR 70.    With non-stop back and chest pain as reported above, was advised to go to the nearest ER for a providers assessment.  Pt advised to seek care, and make triage RN aware of her cardiac history.

## 2022-07-01 ENCOUNTER — Other Ambulatory Visit: Payer: Self-pay | Admitting: Cardiology

## 2022-08-27 ENCOUNTER — Other Ambulatory Visit: Payer: Self-pay | Admitting: Cardiology

## 2022-09-20 ENCOUNTER — Other Ambulatory Visit: Payer: Self-pay

## 2022-09-20 DIAGNOSIS — E782 Mixed hyperlipidemia: Secondary | ICD-10-CM

## 2022-09-20 DIAGNOSIS — E039 Hypothyroidism, unspecified: Secondary | ICD-10-CM

## 2022-09-21 MED ORDER — LEVOTHYROXINE SODIUM 88 MCG PO TABS: 88.0000 ug | ORAL_TABLET | Freq: Every day | ORAL | 0 refills | Status: DC

## 2022-09-21 MED ORDER — FENOFIBRATE 67 MG PO CAPS: ORAL_CAPSULE | ORAL | 2 refills | Status: DC

## 2022-09-25 ENCOUNTER — Other Ambulatory Visit: Payer: Self-pay | Admitting: *Deleted

## 2022-09-25 MED ORDER — CLOPIDOGREL BISULFATE 75 MG PO TABS: 75.0000 mg | ORAL_TABLET | Freq: Every day | ORAL | 0 refills | Status: DC

## 2022-09-25 MED ORDER — AMLODIPINE BESYLATE 5 MG PO TABS: 5.0000 mg | ORAL_TABLET | Freq: Every day | ORAL | 0 refills | Status: DC

## 2022-09-25 MED ORDER — METOPROLOL SUCCINATE ER 50 MG PO TB24: ORAL_TABLET | ORAL | 1 refills | Status: DC

## 2022-09-25 MED ORDER — ROSUVASTATIN CALCIUM 40 MG PO TABS: ORAL_TABLET | ORAL | 0 refills | Status: DC

## 2022-10-08 ENCOUNTER — Other Ambulatory Visit: Payer: Self-pay

## 2022-10-08 DIAGNOSIS — K580 Irritable bowel syndrome with diarrhea: Secondary | ICD-10-CM

## 2022-10-08 MED ORDER — DEXLANSOPRAZOLE 30 MG PO CPDR: 30.0000 mg | DELAYED_RELEASE_CAPSULE | Freq: Every day | ORAL | 2 refills | Status: DC

## 2022-10-15 ENCOUNTER — Other Ambulatory Visit: Payer: Self-pay

## 2022-10-15 DIAGNOSIS — E782 Mixed hyperlipidemia: Secondary | ICD-10-CM

## 2022-10-15 DIAGNOSIS — K580 Irritable bowel syndrome with diarrhea: Secondary | ICD-10-CM

## 2022-10-15 DIAGNOSIS — E039 Hypothyroidism, unspecified: Secondary | ICD-10-CM

## 2022-10-15 NOTE — Telephone Encounter (Signed)
Patient calls nurse line requesting a pharmacy change.   She reports she will be using [facility] for Dexilant, Synthroid and Fenofibrate.  Will forward to PCP.

## 2022-10-16 MED ORDER — LEVOTHYROXINE SODIUM 88 MCG PO TABS
88.0000 ug | ORAL_TABLET | Freq: Every day | ORAL | 0 refills | Status: DC
Start: 2022-10-16 — End: 2022-12-17

## 2022-10-16 MED ORDER — FENOFIBRATE 67 MG PO CAPS
ORAL_CAPSULE | ORAL | 2 refills | Status: DC
Start: 2022-10-16 — End: 2023-03-08

## 2022-10-24 ENCOUNTER — Other Ambulatory Visit: Payer: Self-pay | Admitting: Optometry

## 2022-10-24 DIAGNOSIS — Z1231 Encounter for screening mammogram for malignant neoplasm of breast: Secondary | ICD-10-CM

## 2022-11-05 ENCOUNTER — Ambulatory Visit (INDEPENDENT_AMBULATORY_CARE_PROVIDER_SITE_OTHER): Payer: 59

## 2022-11-05 VITALS — Ht 60.0 in | Wt 157.0 lb

## 2022-11-05 DIAGNOSIS — Z0000 Encounter for general adult medical examination without abnormal findings: Secondary | ICD-10-CM | POA: Diagnosis not present

## 2022-11-05 NOTE — Patient Instructions (Signed)
[name] , Thank you for taking time to come for your Medicare Wellness Visit. I appreciate your ongoing commitment to your health goals. Please review the following plan we discussed and let me know if I can assist you in the future.   These are the goals we discussed:  Goals      Quit Smoking     Remain active and independent        This is a list of the screening recommended for you and due dates:  Health Maintenance  Topic Date Due   COVID-19 Vaccine (1) Never done   Zoster (Shingles) Vaccine (1 of 2) Never done   DTaP/Tdap/Td vaccine (2 - Td or Tdap) 06/19/2022   Flu Shot  01/24/2023   Cologuard (Stool DNA test)  03/12/2023   Medicare Annual Wellness Visit  11/05/2023   Mammogram  12/08/2023   Hepatitis C Screening: USPSTF Recommendation to screen - Ages 18-79 yo.  Completed   HIV Screening  Completed   HPV Vaccine  Aged Out    Advanced directives: Please bring a copy of your health care power of attorney and living will to the office to be added to your chart at your convenience.   Conditions/risks identified: Aim for 30 minutes of exercise or brisk walking, 6-8 glasses of water, and 5 servings of fruits and vegetables each day.  Next appointment: Follow up in one year for your annual wellness visit.   Preventive Care 40-64 Years, Female Preventive care refers to lifestyle choices and visits with your health care provider that can promote health and wellness. What does preventive care include? A yearly physical exam. This is also called an annual well check. Dental exams once or twice a year. Routine eye exams. Ask your health care provider how often you should have your eyes checked. Personal lifestyle choices, including: Daily care of your teeth and gums. Regular physical activity. Eating a healthy diet. Avoiding tobacco and drug use. Limiting alcohol use. Practicing safe sex. Taking low-dose aspirin daily starting at age 56. Taking vitamin and mineral  supplements as recommended by your health care provider. What happens during an annual well check? The services and screenings done by your health care provider during your annual well check will depend on your age, overall health, lifestyle risk factors, and family history of disease. Counseling  Your health care provider may ask you questions about your: Alcohol use. Tobacco use. Drug use. Emotional well-being. Home and relationship well-being. Sexual activity. Eating habits. Work and work Astron[employer]. Method of birth control. Menstrual cycle. Pregnancy history. Screening  You may have the following tests or measurements: Height, weight, and BMI. Blood pressure. Lipid and cholesterol levels. These may be checked every 5 years, or more frequently if you are over 55 years old. Skin check. Lung cancer screening. You may have this screening every year starting at age 36 if you have a 30-pack-year history of smoking and currently smoke or have quit within the past 15 years. Fecal occult blood test (FOBT) of the stool. You may have this test every year starting at age 75. Flexible sigmoidoscopy or colonoscopy. You may have a sigmoidoscopy every 5 years or a colonoscopy every 10 years starting at age 5. Hepatitis C blood test. Hepatitis B blood test. Sexually transmitted disease (STD) testing. Diabetes screening. This is done by checking your blood sugar (glucose) after you have not eaten for a while (fasting). You may have this done every 1-3 years. Mammogram. This may be done every  1-2 years. Talk to your health care provider about when you should start having regular mammograms. This may depend on whether you have a family history of breast cancer. BRCA-related cancer screening. This may be done if you have a family history of breast, ovarian, tubal, or peritoneal cancers. Pelvic exam and Pap test. This may be done every 3 years starting at age 80. Starting at age 47, this may be done  every 5 years if you have a Pap test in combination with an HPV test. Bone density scan. This is done to screen for osteoporosis. You may have this scan if you are at high risk for osteoporosis. Discuss your test results, treatment options, and if necessary, the need for more tests with your health care provider. Vaccines  Your health care provider may recommend certain vaccines, such as: Influenza vaccine. This is recommended every year. Tetanus, diphtheria, and acellular pertussis (Tdap, Td) vaccine. You may need a Td booster every 10 years. Zoster vaccine. You may need this after age 50. Pneumococcal 13-valent conjugate (PCV13) vaccine. You may need this if you have certain conditions and were not previously vaccinated. Pneumococcal polysaccharide (PPSV23) vaccine. You may need one or two doses if you smoke cigarettes or if you have certain conditions. Talk to your health care provider about which screenings and vaccines you need and how often you need them. This information is not intended to replace advice given to you by your health care provider. Make sure you discuss any questions you have with your health care provider. Document Released: 07/08/2015 Document Revised: 02/29/2016 Document Reviewed: 04/12/2015 Elsevier Interactive Patient Education  2017 [place] Prevention in the Home Falls can cause injuries. They can happen to people of all ages. There are many things you can do to make your home safe and to help prevent falls. What can I do on the outside of my home? Regularly fix the edges of walkways and driveways and fix any cracks. Remove anything that might make you trip as you walk through a door, such as a raised step or threshold. Trim any bushes or trees on the path to your home. Use bright outdoor lighting. Clear any walking paths of anything that might make someone trip, such as rocks or tools. Regularly check to see if handrails are loose or broken. Make  sure that both sides of any steps have handrails. Any raised decks and porches should have guardrails on the edges. Have any leaves, snow, or ice cleared regularly. Use sand or salt on walking paths during winter. Clean up any spills in your garage right away. This includes oil or grease spills. What can I do in the bathroom? Use night lights. Install grab bars by the toilet and in the tub and shower. Do not use towel bars as grab bars. Use non-skid mats or decals in the tub or shower. If you need to sit down in the shower, use a plastic, non-slip stool. Keep the floor dry. Clean up any water that spills on the floor as soon as it happens. Remove soap buildup in the tub or shower regularly. Attach bath mats securely with double-sided non-slip rug tape. Do not have throw rugs and other things on the floor that can make you trip. What can I do in the bedroom? Use night lights. Make sure that you have a light by your bed that is easy to reach. Do not use any sheets or blankets that are too big for your bed.  They should not hang down onto the floor. Have a firm chair that has side arms. You can use this for support while you get dressed. Do not have throw rugs and other things on the floor that can make you trip. What can I do in the kitchen? Clean up any spills right away. Avoid walking on wet floors. Keep items that you use a lot in easy-to-reach places. If you need to reach something above you, use a strong step stool that has a grab bar. Keep electrical cords out of the way. Do not use floor polish or wax that makes floors slippery. If you must use wax, use non-skid floor wax. Do not have throw rugs and other things on the floor that can make you trip. What can I do with my stairs? Do not leave any items on the stairs. Make sure that there are handrails on both sides of the stairs and use them. Fix handrails that are broken or loose. Make sure that handrails are as long as the  stairways. Check any carpeting to make sure that it is firmly attached to the stairs. Fix any carpet that is loose or worn. Avoid having throw rugs at the top or bottom of the stairs. If you do have throw rugs, attach them to the floor with carpet tape. Make sure that you have a light switch at the top of the stairs and the bottom of the stairs. If you do not have them, ask someone to add them for you. What else can I do to help prevent falls? Wear shoes that: Do not have high heels. Have rubber bottoms. Are comfortable and fit you well. Are closed at the toe. Do not wear sandals. If you use a stepladder: Make sure that it is fully opened. Do not climb a closed stepladder. Make sure that both sides of the stepladder are locked into place. Ask someone to hold it for you, if possible. Clearly mark and make sure that you can see: Any grab bars or handrails. First and last steps. Where the edge of each step is. Use tools that help you move around (mobility aids) if they are needed. These include: Canes. Walkers. Scooters. Crutches. Turn on the lights when you go into a dark area. Replace any light bulbs as soon as they burn out. Set up your furniture so you have a clear path. Avoid moving your furniture around. If any of your floors are uneven, fix them. If there are any pets around you, be aware of where they are. Review your medicines with your doctor. Some medicines can make you feel dizzy. This can increase your chance of falling. Ask your doctor what other things that you can do to help prevent falls. This information is not intended to replace advice given to you by your health care provider. Make sure you discuss any questions you have with your health care provider. Document Released: 04/07/2009 Document Revised: 11/17/2015 Document Reviewed: 07/16/2014 Elsevier Interactive Patient Education  2017 [employer].

## 2022-11-05 NOTE — Progress Notes (Signed)
Subjective:   [name] is a 67 y.o. female who presents for Medicare Annual (Subsequent) preventive examination.  I connected with  [name] on 11/05/22 by a audio enabled telemedicine application and verified that I am speaking with the correct person using two identifiers.  Patient Location: Home  Provider Location: Home Office  I discussed the limitations of evaluation and management by telemedicine. The patient expressed understanding and agreed to proceed.  Review of Systems     Cardiac Risk Factors include: hypertension;smoking/ tobacco exposure;sedentary lifestyle;dyslipidemia     Objective:    Today's Vitals   11/05/22 0859  Weight: 157 lb (71.2 kg)  Height: 5' (1.524 m)   Body mass index is 30.66 kg/m.     11/05/2022   11:09 AM 03/26/2022    1:06 PM 11/16/2021    2:28 PM 05/23/2014    2:35 PM 05/22/2014    9:05 PM 06/04/2013    2:00 AM 03/28/2012    5:37 AM  Advanced Directives  Does Patient Have a Medical Advance Directive? Yes No No No No Patient does not have advance directive;Patient would not like information   Type of Advance Directive Living will;Healthcare Power of Attorney        Does patient want to make changes to medical advance directive? No - Patient declined        Copy of Healthcare Power of Attorney in Chart? No - copy requested        Would patient like information on creating a medical advance directive?  No - Patient declined No - Patient declined  No - patient declined information    Pre-existing out of facility DNR order (yellow form or pink MOST form)      No No    Current Medications (verified) Outpatient Encounter Medications as of 11/05/2022  Medication Sig   amLODipine (NORVASC) 5 MG tablet Take 1 tablet (5 mg total) by mouth daily.   clopidogrel (PLAVIX) 75 MG tablet Take 1 tablet (75 mg total) by mouth daily.   Dexlansoprazole (DEXILANT) 30 MG capsule DR Take 1 capsule (30 mg total) by mouth daily.   diclofenac Sodium  (VOLTAREN) 1 % GEL Apply 2 g topically 4 (four) times daily.   diphenhydramine-acetaminophen (TYLENOL PM) 25-500 MG TABS Take 1 tablet by mouth at bedtime as needed (pain, sleep).   fenofibrate micronized (LOFIBRA) 67 MG capsule TAKE 1 CAPSULE(67 MG) BY MOUTH DAILY BEFORE BREAKFAST   levothyroxine (SYNTHROID) 88 MCG tablet Take 1 tablet (88 mcg total) by mouth daily before breakfast.   metoprolol succinate (TOPROL-XL) 50 MG 24 hr tablet TAKE 1 TABLET BY MOUTH EVERY DAY WITH OR IMMEDIATELY FOLLOWING A MEAL   Multiple Vitamin (MULTIVITAMIN) tablet Take 1 tablet by mouth daily.   nitroGLYCERIN (NITROSTAT) 0.4 MG SL tablet Place 1 tablet (0.4 mg total) under the tongue every 5 (five) minutes as needed for chest pain.   rosuvastatin (CRESTOR) 40 MG tablet TAKE 1 TABLET(40 MG) BY MOUTH DAILY   tiZANidine (ZANAFLEX) 4 MG tablet Take 1 tablet (4 mg total) by mouth every 8 (eight) hours as needed for muscle spasms.   [DISCONTINUED] methocarbamol (ROBAXIN) 500 MG tablet Take 1 tablet (500 mg total) by mouth 2 (two) times daily.   No facility-administered encounter medications on file as of 11/05/2022.    Allergies (verified) Ranitidine, Cimetidine, Sulfamethoxazole-trimethoprim, and Sulfa antibiotics   History: Past Medical History:  Diagnosis Date   Anemia    Anxiety    Blood transfusion  Chest pain, cardiac    Has chronic chest pain.    Complication of anesthesia    "my blood pressure will drop out on me"   Coronary artery disease    a) s/p CABG x 2 in [place] 2002. b)stenting to the SVG-OM in 2009. c)DES to the saphenous vein graft to the ramus in March 2012. d) s/p PTCA/DES to native ramus intermedius in September 2012. 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"   Enterocolitis     Gastroparesis    GERD (gastroesophageal reflux disease)    GI bleeding    Hematuria 09/25/11   "lately"   Hx: UTI (urinary tract infection)    multiple   Hyperlipidemia    Hypertension    Hypothyroidism    Migraines    h/o   PTSD (post-traumatic stress disorder)    Tobacco abuse    Past Surgical History:  Procedure Laterality Date   ABDOMINAL HYSTERECTOMY  02/24/1979   BIOPSY STOMACH  09/25/2011   "several in the last year; all negative; via endoscopy"   BREAST EXCISIONAL BIOPSY Right    BREAST SURGERY     right biopsy   CHOLECYSTECTOMY  02/24/1999   CORONARY ANGIOPLASTY WITH STENT PLACEMENT     "lots; they've all collapsed; last time dr took native vein &  turned it around & put stents in it before attaching"   CORONARY ARTERY BYPASS GRAFT  06/25/2000   CABG X2   CYSTO WITH HYDRODISTENSION  03/28/2012   Procedure: CYSTOSCOPY/HYDRODISTENSION;  Surgeon: Martina Sinner, MD;  Location: [facility];  Service: Urology;  Laterality: N/A;   CYSTOSCOPY W/ RETROGRADES  03/28/2012   Procedure: CYSTOSCOPY WITH RETROGRADE PYELOGRAM;  Surgeon: Martina Sinner, MD;  Location: [facility];  Service: Urology;  Laterality: Bilateral;   CYSTOSCOPY WITH INJECTION  03/28/2012   Procedure: CYSTOSCOPY WITH INJECTION;  Surgeon: Martina Sinner, MD;  Location: [facility];  Service: Urology;  Laterality: N/A;  Marcaine and Pyridium   PILONIDAL CYST / SINUS EXCISION  02/24/1979   "was wrapped around my spinal cord; Dr. Yvette Rack got almost all of it except a tiny bit; said it was really deep; packed fatty tissue in the hole; really bothers me alot"   SALPINGOOPHORECTOMY  02/24/1979   "2 surgeries; 1st right; then left"   TMJ ARTHROPLASTY  02/24/1979   right side; "I've had to have it twice"   VESICOVAGINAL FISTULA CLOSURE W/ TAH  06/25/1981   Family History  Problem Relation Age of Onset   Emphysema Mother        smoked   Asthma Mother    Breast cancer Mother    Heart disease Father    Heart  disease Paternal Grandfather    Social History   Socioeconomic History   Marital status: Widowed    Spouse name: Not on file   Number of children: 2   Years of education: Not on file   Highest education level: Not on file  Occupational History   Occupation: Disabled  Tobacco Use   Smoking status: Every Day    Packs/day: 1.00    Years: 15.00    Additional pack years: 0.00    Total pack years: 15.00    Types: Cigarettes   Smokeless tobacco: Never  Vaping Use  Vaping Use: Never used  Substance and Sexual Activity   Alcohol use: No   Drug use: Not Currently    Types: Marijuana    Comment: "as a teen"   Sexual activity: Not Currently  Other Topics Concern   Not on file  Social History Narrative   Not on file   Social Determinants of Health   Financial Resource Strain: Low Risk  (11/05/2022)   Overall Financial Resource Strain (CARDIA)    Difficulty of Paying Living Expenses: Not hard at all  Food Insecurity: No Food Insecurity (11/05/2022)   Hunger Vital Sign    Worried About Running Out of Food in the Last Year: Never true    Ran Out of Food in the Last Year: Never true  Transportation Needs: No Transportation Needs (11/05/2022)   PRAPARE - Administrator, Civil Service (Medical): No    Lack of Transportation (Non-Medical): No  Physical Activity: Insufficiently Active (11/05/2022)   Exercise Vital Sign    Days of Exercise per Week: 3 days    Minutes of Exercise per Session: 30 min  Stress: Stress Concern Present (11/05/2022)   [employer] of Occupational Health - Occupational Stress Questionnaire    Feeling of Stress : To some extent  Social Connections: Socially Isolated (11/05/2022)   Social Connection and Isolation Panel [NHANES]    Frequency of Communication with Friends and Family: More than three times a week    Frequency of Social Gatherings with Friends and Family: Three times a week    Attends Religious Services: Never    Active Member of  Clubs or Organizations: No    Attends Banker Meetings: Never    Marital Status: Widowed    Tobacco Counseling Ready to quit: Not Answered Counseling given: Not Answered   Clinical Intake:  Pre-visit preparation completed: Yes  Pain : No/denies pain     Diabetes: No  How often do you need to have someone help you when you read instructions, pamphlets, or other written materials from your doctor or pharmacy?: 1 - Never  Diabetic?No   Interpreter Needed?: No  Information entered by :: Kandis Fantasia LPN   Activities of Daily Living    11/05/2022   11:08 AM  In your present state of health, do you have any difficulty performing the following activities:  Hearing? 0  Vision? 0  Difficulty concentrating or making decisions? 0  Walking or climbing stairs? 0  Dressing or bathing? 0  Doing errands, shopping? 0  Preparing Food and eating ? N  Using the Toilet? N  In the past six months, have you accidently leaked urine? N  Do you have problems with loss of bowel control? N  Managing your Medications? N  Managing your Finances? N  Housekeeping or managing your Housekeeping? N    Patient Care Team: Alfredo Martinez, MD as PCP - General (Family Medicine) [place], Peter M, MD as Referring Physician (Cardiology)  Indicate any recent Medical Services you may have received from other than [facility] providers in the past year (date may be approximate).     Assessment:   This is a routine wellness examination for Anyely.  Hearing/Vision screen Hearing Screening - Comments:: Denies hearing difficulties   Vision Screening - Comments:: Wears rx glasses - up to date with routine eye exams with [facility]    Dietary issues and exercise activities discussed: Current Exercise Habits: Home exercise routine, Type of exercise: walking;stretching, Time (Minutes): 30, Frequency (Times/Week): 3, Weekly Exercise (  Minutes/Week): 90, Intensity: Mild   Goals Addressed              This Visit's Progress    Quit Smoking       Remain active and independent        Depression Screen    11/05/2022   11:09 AM 03/26/2022    1:06 PM 01/09/2022    8:48 AM 02/04/2020    3:11 PM 03/27/2018    2:41 PM 11/05/2016    9:41 AM 06/19/2012   11:24 AM  PHQ 2/9 Scores  PHQ - 2 Score 5 5 1 3  0 2 0  PHQ- 9 Score 12 18 6 6  11      Fall Risk    11/05/2022   11:08 AM 03/27/2018    2:41 PM 11/05/2016    9:41 AM 06/19/2012   11:24 AM  Fall Risk   Falls in the past year? 0 No No No  Number falls in past yr: 0     Injury with Fall? 0     Risk for fall due to : No Fall Risks     Follow up Falls prevention discussed;Education provided;Falls evaluation completed       FALL RISK PREVENTION PERTAINING TO THE HOME:  Any stairs in or around the home? No  If so, are there any without handrails? No  Home free of loose throw rugs in walkways, pet beds, electrical cords, etc? Yes  Adequate lighting in your home to reduce risk of falls? Yes   ASSISTIVE DEVICES UTILIZED TO PREVENT FALLS:  Life alert? No  Use of a cane, walker or w/c? No  Grab bars in the bathroom? Yes  Shower chair or bench in shower? No  Elevated toilet seat or a handicapped toilet? Yes   TIMED UP AND GO:  Was the test performed? No . Telephonic visit   Cognitive Function:        11/05/2022   11:09 AM  6CIT Screen  What Year? 0 points  What month? 0 points  What time? 0 points  Count back from 20 0 points  Months in reverse 0 points  Repeat phrase 0 points  Total Score 0 points    Immunizations Immunization History  Administered Date(s) Administered   Influenza, Quadrivalent, Recombinant, Inj, Pf 04/13/2019   Influenza, Seasonal, Injecte, Preservative Fre 06/19/2012   Influenza,inj,Quad PF,6+ Mos 03/26/2014, 04/11/2016, 04/08/2017, 03/27/2018   Pneumococcal Conjugate-13 04/27/2015   Pneumococcal Polysaccharide-23 04/08/2017   Tdap 06/19/2012    TDAP status: Due, Education has been provided  regarding the importance of this vaccine. Advised may receive this vaccine at local pharmacy or Health Dept. Aware to provide a copy of the vaccination record if obtained from local pharmacy or Health Dept. Verbalized acceptance and understanding.  Flu Vaccine status: Up to date  Pneumococcal vaccine status: Up to date  Covid-19 vaccine status: Information provided on how to obtain vaccines.   Qualifies for Shingles Vaccine? Yes   Zostavax completed No   Shingrix Completed?: No.    Education has been provided regarding the importance of this vaccine. Patient has been advised to call insurance company to determine out of pocket expense if they have not yet received this vaccine. Advised may also receive vaccine at local pharmacy or Health Dept. Verbalized acceptance and understanding.  Screening Tests Health Maintenance  Topic Date Due   COVID-19 Vaccine (1) Never done   Zoster Vaccines- Shingrix (1 of 2) Never done   DTaP/Tdap/Td (2 - Td or  Tdap) 06/19/2022   INFLUENZA VACCINE  01/24/2023   Fecal DNA (Cologuard)  03/12/2023   Medicare Annual Wellness (AWV)  11/05/2023   MAMMOGRAM  12/08/2023   Hepatitis C Screening  Completed   HIV Screening  Completed   HPV VACCINES  Aged Out    Health Maintenance  Health Maintenance Due  Topic Date Due   COVID-19 Vaccine (1) Never done   Zoster Vaccines- Shingrix (1 of 2) Never done   DTaP/Tdap/Td (2 - Td or Tdap) 06/19/2022    Colorectal cancer screening: Type of screening: Cologuard. Completed 03/11/20. Repeat every 3 years  Mammogram status: Ordered and scheduled for 01/25/23. Pt provided with contact info and advised to call to schedule appt.   Lung Cancer Screening: (Low Dose CT Chest recommended if Age 54-80 years, 30 pack-year currently smoking OR have quit w/in 15years.) does not qualify.   Lung Cancer Screening Referral: n/a  Additional Screening:  Hepatitis C Screening: does qualify; Completed 11/05/16  Vision Screening:  Recommended annual ophthalmology exams for early detection of glaucoma and other disorders of the eye. Is the patient up to date with their annual eye exam?  Yes  Who is the provider or what is the name of the office in which the patient attends annual eye exams? [facility]  If pt is not established with a provider, would they like to be referred to a provider to establish care? No .   Dental Screening: Recommended annual dental exams for proper oral hygiene  Community Resource Referral / Chronic Care Management: CRR required this visit?  No   CCM required this visit?  No      Plan:     I have personally reviewed and noted the following in the patient's chart:   Medical and social history Use of alcohol, tobacco or illicit drugs  Current medications and supplements including opioid prescriptions. Patient is not currently taking opioid prescriptions. Functional ability and status Nutritional status Physical activity Advanced directives List of other physicians Hospitalizations, surgeries, and ER visits in previous 12 months Vitals Screenings to include cognitive, depression, and falls Referrals and appointments  In addition, I have reviewed and discussed with patient certain preventive protocols, quality metrics, and best practice recommendations. A written personalized care plan for preventive services as well as general preventive health recommendations were provided to patient.     Durwin Nora, [place]   1/61/0960   Due to this being a virtual visit, the after visit summary with patients personalized plan was offered to patient via mail or my-chart.  Patient would like to access on my-chart  Nurse Notes: No concerns; patient will be out of town for 6 weeks to care for mother in law, plans to schedule a follow up visit once she returns.

## 2022-11-10 ENCOUNTER — Other Ambulatory Visit: Payer: Self-pay | Admitting: Cardiology

## 2022-11-19 ENCOUNTER — Other Ambulatory Visit: Payer: Self-pay | Admitting: Cardiology

## 2022-12-14 ENCOUNTER — Other Ambulatory Visit: Payer: Self-pay | Admitting: Cardiology

## 2022-12-17 ENCOUNTER — Other Ambulatory Visit: Payer: Self-pay | Admitting: Student

## 2022-12-17 DIAGNOSIS — E039 Hypothyroidism, unspecified: Secondary | ICD-10-CM

## 2022-12-21 ENCOUNTER — Ambulatory Visit: Payer: Medicare Other | Admitting: Cardiology

## 2023-01-25 ENCOUNTER — Ambulatory Visit: Payer: 59

## 2023-01-25 ENCOUNTER — Ambulatory Visit: Payer: Medicare Other

## 2023-02-07 ENCOUNTER — Other Ambulatory Visit: Payer: Self-pay | Admitting: Cardiology

## 2023-02-08 ENCOUNTER — Ambulatory Visit
Admission: RE | Admit: 2023-02-08 | Discharge: 2023-02-08 | Disposition: A | Discharge status: Home / Self Care | Payer: 59 | Source: Ambulatory Visit | Attending: Optometry | Admitting: Optometry

## 2023-02-08 DIAGNOSIS — Z1231 Encounter for screening mammogram for malignant neoplasm of breast: Secondary | ICD-10-CM

## 2023-02-24 ENCOUNTER — Other Ambulatory Visit: Payer: Self-pay | Admitting: Cardiology

## 2023-02-25 NOTE — Progress Notes (Signed)
[name] Date of Birth: 05-Jun-1956 Medical Record #[id number]  History of Present Illness: [name] is seen today for follow up CAD. She had remote coronary bypass surgery in 2002. She had prior stenting of the vein graft to the obtuse marginal vessel in 2009. She then had a drug-eluting stent placement to this same saphenous vein graft to the ramus in March of 2012. This subsequently occluded and she underwent angioplasty and drug-eluting stent to the ramus intermediate branch in September of 2012. Cardiac catheterization again in October of 2012 showed nonobstructive disease. Her last evaluation with Steffanie Dunn in July 2015 showed no ischemia and normal EF.  She states she was doing really well until about a month ago when she experienced severe angina. It was so bad she couldn't move. Chest pain radiating to her jaw. Took 3 sl Ntg and became very dizzy like she might pass out. BP was very low. Went to ED but never got checked in and she left. States pain lasted 4 hours. She is still smoking 1/2 pk/day. Since then she has not felt well and has no energy. During this she also noted her HR was fast and skipping beats.   Current Outpatient Medications on File Prior to Visit  Medication Sig Dispense Refill   amLODipine (NORVASC) 5 MG tablet TAKE 1 TABLET BY MOUTH DAILY 30 tablet 0   clopidogrel (PLAVIX) 75 MG tablet TAKE 1 TABLET BY MOUTH DAILY 30 tablet 0   Dexlansoprazole (DEXILANT) 30 MG capsule DR Take 1 capsule (30 mg total) by mouth daily. 90 capsule 2   diphenhydramine-acetaminophen (TYLENOL PM) 25-500 MG TABS Take 1 tablet by mouth at bedtime as needed (pain, sleep).     fenofibrate micronized (LOFIBRA) 67 MG capsule TAKE 1 CAPSULE(67 MG) BY MOUTH DAILY BEFORE BREAKFAST 90 capsule 2   levothyroxine (SYNTHROID) 88 MCG tablet TAKE 1 TABLET BY MOUTH DAILY  BEFORE BREAKFAST 90 tablet 3   metoprolol succinate (TOPROL-XL) 50 MG 24 hr tablet TAKE 1 TABLET BY MOUTH DAILY  WITH OR IMMEDIATELY  FOLLOWING A  MEAL 30 tablet 0   rosuvastatin (CRESTOR) 40 MG tablet TAKE 1 TABLET BY MOUTH DAILY 30 tablet 0   tiZANidine (ZANAFLEX) 4 MG tablet Take 1 tablet (4 mg total) by mouth every 8 (eight) hours as needed for muscle spasms. 30 tablet 0   nitroGLYCERIN (NITROSTAT) 0.4 MG SL tablet Place 1 tablet (0.4 mg total) under the tongue every 5 (five) minutes as needed for chest pain. 25 tablet 11   No current facility-administered medications on file prior to visit.    Allergies  Allergen Reactions   Ranitidine Hives   Cimetidine Hives    Pt reported   Sulfamethoxazole-Trimethoprim     Patient couldn't give specific allergy   Sulfa Antibiotics Other (See Comments)    Childhood reaction    Past Medical History:  Diagnosis Date   Anemia    Anxiety    Blood transfusion    Chest pain, cardiac    Has chronic chest pain.    Complication of anesthesia    "my blood pressure will drop out on me"   Coronary artery disease    a) s/p CABG x 2 in [place] 2002. b)stenting to the SVG-OM in 2009. c)DES to the saphenous vein graft to the ramus in March 2012. d) s/p PTCA/DES to native ramus intermedius in September 2012. Last cath 03/2011 with minimal nonobstructive disease  e) indeterminant myoview 09/2011 but atypical CP & no perfusion defect in  ramus intermediate distribution, EF 52%   Depression    Embolism - blood clot 2002   "in my heart after bypass"   Enterocolitis    Gastroparesis    GERD (gastroesophageal reflux disease)    GI bleeding    Hematuria 09/25/11   "lately"   Hx: UTI (urinary tract infection)    multiple   Hyperlipidemia    Hypertension    Hypothyroidism    Migraines    h/o   PTSD (post-traumatic stress disorder)    Tobacco abuse     Past Surgical History:  Procedure Laterality Date   ABDOMINAL HYSTERECTOMY  02/24/1979   BIOPSY STOMACH  09/25/2011   "several in the last year; all negative; via endoscopy"   BREAST EXCISIONAL BIOPSY Right    BREAST SURGERY      right biopsy   CHOLECYSTECTOMY  02/24/1999   CORONARY ANGIOPLASTY WITH STENT PLACEMENT     "lots; they've all collapsed; last time dr took native vein &  turned it around & put stents in it before attaching"   CORONARY ARTERY BYPASS GRAFT  06/25/2000   CABG X2   CYSTO WITH HYDRODISTENSION  03/28/2012   Procedure: CYSTOSCOPY/HYDRODISTENSION;  Surgeon: Martina Sinner, MD;  Location: [facility];  Service: Urology;  Laterality: N/A;   CYSTOSCOPY W/ RETROGRADES  03/28/2012   Procedure: CYSTOSCOPY WITH RETROGRADE PYELOGRAM;  Surgeon: Martina Sinner, MD;  Location: [facility];  Service: Urology;  Laterality: Bilateral;   CYSTOSCOPY WITH INJECTION  03/28/2012   Procedure: CYSTOSCOPY WITH INJECTION;  Surgeon: Martina Sinner, MD;  Location: [facility];  Service: Urology;  Laterality: N/A;  Marcaine and Pyridium   PILONIDAL CYST / SINUS EXCISION  02/24/1979   "was wrapped around my spinal cord; Dr. Yvette Rack got almost all of it except a tiny bit; said it was really deep; packed fatty tissue in the hole; really bothers me alot"   SALPINGOOPHORECTOMY  02/24/1979   "2 surgeries; 1st right; then left"   TMJ ARTHROPLASTY  02/24/1979   right side; "I've had to have it twice"   VESICOVAGINAL FISTULA CLOSURE W/ TAH  06/25/1981    Social History   Tobacco Use  Smoking Status Every Day   Current packs/day: 1.00   Average packs/day: 1 pack/day for 15.0 years (15.0 ttl pk-yrs)   Types: Cigarettes  Smokeless Tobacco Never    Social History   Substance and Sexual Activity  Alcohol Use No    Family History  Problem Relation Age of Onset   Emphysema Mother        smoked   Asthma Mother    Breast cancer Mother    Heart disease Father    Heart disease Paternal Grandfather     Review of Systems: The review of systems is as noted in HPI.   All other systems were reviewed and are negative.  Physical Exam: BP 118/67 (BP Location: Right Arm, Patient Position: Sitting, Cuff Size: Normal)   Pulse  65   Ht 5' (1.524 m)   Wt 150 lb (68 kg)   SpO2 97%   BMI 29.29 kg/m  GENERAL:  Well appearing WF in NAD HEENT:  PERRL, EOMI, sclera are clear. Oropharynx is clear. NECK:  No jugular venous distention, carotid upstroke brisk and symmetric, no bruits, no thyromegaly or adenopathy LUNGS:  Clear to auscultation bilaterally CHEST:  Unremarkable HEART:  RRR,  PMI not displaced or sustained,S1 and S2 within normal limits, no S3, no S4: no clicks, no rubs, no  murmurs ABD:  Soft, nontender. BS +, no masses or bruits. No hepatomegaly, no splenomegaly EXT:  2 + pulses throughout, no edema, no cyanosis no clubbing SKIN:  Warm and dry.  No rashes NEURO:  Alert and oriented x 3. Cranial nerves II through XII intact. PSYCH:  Cognitively intact   LABORATORY DATA:   Lab Results  Component Value Date   WBC 10.4 11/16/2021   HGB 15.2 11/16/2021   HCT 43.6 11/16/2021   PLT 299 11/16/2021   GLUCOSE 92 11/16/2021   CHOL 129 11/16/2021   TRIG 186 (H) 11/16/2021   HDL 36 (L) 11/16/2021   LDLDIRECT 100.0 07/29/2018   LDLCALC 62 11/16/2021   ALT 19 11/16/2021   AST 16 11/16/2021   NA 141 11/16/2021   K 4.8 11/16/2021   CL 103 11/16/2021   CREATININE 0.82 11/16/2021   BUN 17 11/16/2021   CO2 24 11/16/2021   TSH 4.160 03/26/2022   INR 1.07 09/25/2011   HGBA1C 5.8 11/05/2016   EKG Interpretation Date/Time:  Monday March 04 2023 08:38:23 EDT Ventricular Rate:  65 PR Interval:  132 QRS Duration:  86 QT Interval:  428 QTC Calculation: 445 R Axis:   1  Text Interpretation: Normal sinus rhythm Nonspecific T wave abnormality When compared with ECG of 25-Dec-2013 13:35, Fusion complexes are no longer Present Vent. rate has decreased BY  47 BPM Questionable change in QRS axis ST no longer depressed in Inferior leads Confirmed by [place], Ruvim Risko [phone]) on 03/04/2023 9:10:24 AM    Assessment / Plan: 1. Coronary disease with previous bypass and multiple interventions as noted above. Now with  accelerating angina.  She has a history of chronic and atypical chest pain. More recently had severe chest pain. Ecg is reassuring today. Continue  Plavix, amlodipine, Toprol XL and Crestor. Will check lab work today and arrange for a stress PET/CT.  2. Hypertension, well controlled on medication.  3. Tobacco abuse.  Encourage complete cessation.   4. Hyperlipidemia.  on Crestor and fenofibrate. Will update labs.   5. COPD  6. Hypothyroidism. Followed by primary care.   7. Hypothyroidism. Check TSH.

## 2023-02-26 ENCOUNTER — Other Ambulatory Visit: Payer: Self-pay | Admitting: Cardiology

## 2023-03-04 ENCOUNTER — Encounter: Payer: Self-pay | Admitting: Cardiology

## 2023-03-04 ENCOUNTER — Ambulatory Visit: Payer: 59 | Attending: Cardiology | Admitting: Cardiology

## 2023-03-04 VITALS — BP 118/67 | HR 65 | Ht 60.0 in | Wt 150.0 lb

## 2023-03-04 DIAGNOSIS — I10 Essential (primary) hypertension: Secondary | ICD-10-CM | POA: Diagnosis not present

## 2023-03-04 DIAGNOSIS — E782 Mixed hyperlipidemia: Secondary | ICD-10-CM

## 2023-03-04 DIAGNOSIS — Z720 Tobacco use: Secondary | ICD-10-CM | POA: Diagnosis not present

## 2023-03-04 DIAGNOSIS — I25700 Atherosclerosis of coronary artery bypass graft(s), unspecified, with unstable angina pectoris: Secondary | ICD-10-CM | POA: Diagnosis not present

## 2023-03-04 NOTE — Patient Instructions (Signed)
Medication Instructions:  Continue same medications *If you need a refill on your cardiac medications before your next appointment, please call your pharmacy*   Lab Work: Cbc,cmet,tsh, lipid panel today   Testing/Procedures: Cardiac Pet Scan will be scheduled after approved by insurance    Follow instructions below   Follow-Up: At [facility], you and your health needs are our priority.  As part of our continuing mission to provide you with exceptional heart care, we have created designated Provider Care Teams.  These Care Teams include your primary Cardiologist (physician) and Advanced Practice Providers (APPs -  Physician Assistants and Nurse Practitioners) who all work together to provide you with the care you need, when you need it.  We recommend signing up for the patient portal called "MyChart".  Sign up information is provided on this After Visit Summary.  MyChart is used to connect with patients for Virtual Visits (Telemedicine).  Patients are able to view lab/test results, encounter notes, upcoming appointments, etc.  Non-urgent messages can be sent to your provider as well.   To learn more about what you can do with MyChart, go to [email or web address].    Your next appointment:  To Be Determined after test    Provider:  Dr.Jordan        How to Prepare for Your Cardiac PET/CT Stress Test:  1. Please do not take these medications before your test:   Medications that may interfere with the cardiac pharmacological stress agent (ex. nitrates - including erectile dysfunction medications, isosorbide mononitrate, tamulosin or beta-blockers) the day of the exam. (Erectile dysfunction medication should be held for at least 72 hrs prior to test) Theophylline containing medications for 12 hours. Dipyridamole 48 hours prior to the test. Your remaining medications may be taken with water.  2. Nothing to eat or drink, except water, 3 hours prior to arrival time.   NO  caffeine/decaffeinated products, or chocolate 12 hours prior to arrival.  3. NO perfume, cologne or lotion on chest or abdomen area.          - FEMALES - Please avoid wearing dresses to this appointment.  4. Total time is 1 to 2 hours; you may want to bring reading material for the waiting time.  5. Please report to Radiology at the [facility] Main Entrance 30 minutes early for your test.  [address] [place], [place] [address]  6. Please report to Radiology at [facility] Main Entrance, medical mall, 30 mins prior to your test.  [address]  [place], [place]  [phone]  Diabetic Preparation:  Hold oral medications. You may take NPH and Lantus insulin. Do not take Humalog or Humulin R (Regular Insulin) the day of your test. Check blood sugars prior to leaving the house. If able to eat breakfast prior to 3 hour fasting, you may take all medications, including your insulin, Do not worry if you miss your breakfast dose of insulin - start at your next meal. Patients who wear a continuous glucose monitor MUST remove the device prior to scanning.  IF YOU THINK YOU MAY BE PREGNANT, OR ARE NURSING PLEASE INFORM THE TECHNOLOGIST.  In preparation for your appointment, medication and supplies will be purchased.  Appointment availability is limited, so if you need to cancel or reschedule, please call the Radiology Department at [phone] [facility]) OR [phone] [facility])  24 hours in advance to avoid a cancellation fee of $100.00  What to Expect After you Arrive:  Once you arrive and check in for your appointment, you will be taken to a preparation room within the Radiology Department.  A technologist or Nurse will obtain your medical history, verify that you are correctly prepped for the exam, and explain the procedure.  Afterwards,  an IV will be started in your arm and electrodes will be placed on your skin for EKG monitoring during the  stress portion of the exam. Then you will be escorted to the PET/CT scanner.  There, staff will get you positioned on the scanner and obtain a blood pressure and EKG.  During the exam, you will continue to be connected to the EKG and blood pressure machines.  A small, safe amount of a radioactive tracer will be injected in your IV to obtain a series of pictures of your heart along with an injection of a stress agent.    After your Exam:  It is recommended that you eat a meal and drink a caffeinated beverage to counter act any effects of the stress agent.  Drink plenty of fluids for the remainder of the day and urinate frequently for the first couple of hours after the exam.  Your doctor will inform you of your test results within 7-10 business days.  For more information and frequently asked questions, please visit our website : [email or web address]  For questions about your test or how to prepare for your test, please call: Cardiac Imaging Nurse Navigators Office: [phone]

## 2023-03-05 LAB — CBC WITH DIFFERENTIAL/PLATELET
Basophils Absolute: 0 10*3/uL (ref 0.0–0.2)
Basos: 0 %
EOS (ABSOLUTE): 0.3 10*3/uL (ref 0.0–0.4)
Eos: 2 %
Hematocrit: 46.4 % (ref 34.0–46.6)
Hemoglobin: 15.3 g/dL (ref 11.1–15.9)
Immature Grans (Abs): 0 10*3/uL (ref 0.0–0.1)
Immature Granulocytes: 0 %
Lymphocytes Absolute: 3.5 10*3/uL — ABNORMAL HIGH (ref 0.7–3.1)
Lymphs: 29 %
MCH: 30.5 pg (ref 26.6–33.0)
MCHC: 33 g/dL (ref 31.5–35.7)
MCV: 92 fL (ref 79–97)
Monocytes Absolute: 0.5 10*3/uL (ref 0.1–0.9)
Monocytes: 4 %
Neutrophils Absolute: 7.9 10*3/uL — ABNORMAL HIGH (ref 1.4–7.0)
Neutrophils: 65 %
Platelets: 299 10*3/uL (ref 150–450)
RBC: 5.02 x10E6/uL (ref 3.77–5.28)
RDW: 13 % (ref 11.7–15.4)
WBC: 12.2 10*3/uL — ABNORMAL HIGH (ref 3.4–10.8)

## 2023-03-05 LAB — COMPREHENSIVE METABOLIC PANEL
ALT: 12 IU/L (ref 0–32)
AST: 12 IU/L (ref 0–40)
Albumin: 4.5 g/dL (ref 3.9–4.9)
Alkaline Phosphatase: 54 IU/L (ref 44–121)
BUN/Creatinine Ratio: 18 (ref 12–28)
BUN: 10 mg/dL (ref 8–27)
Bilirubin Total: 0.2 mg/dL (ref 0.0–1.2)
CO2: 27 mmol/L (ref 20–29)
Calcium: 9.9 mg/dL (ref 8.7–10.3)
Chloride: 102 mmol/L (ref 96–106)
Creatinine, Ser: 0.56 mg/dL — ABNORMAL LOW (ref 0.57–1.00)
Globulin, Total: 2.3 g/dL (ref 1.5–4.5)
Glucose: 93 mg/dL (ref 70–99)
Potassium: 4.7 mmol/L (ref 3.5–5.2)
Sodium: 141 mmol/L (ref 134–144)
Total Protein: 6.8 g/dL (ref 6.0–8.5)
eGFR: 101 mL/min/{1.73_m2} (ref >59)

## 2023-03-05 LAB — TSH: TSH: 2.43 u[IU]/mL (ref 0.450–4.500)

## 2023-03-05 LAB — LIPID PANEL
Chol/HDL Ratio: 3.3 ratio (ref 0.0–4.4)
Cholesterol, Total: 129 mg/dL (ref 100–199)
HDL: 39 mg/dL — ABNORMAL LOW (ref >39)
LDL Chol Calc (NIH): 68 mg/dL (ref 0–99)
Triglycerides: 122 mg/dL (ref 0–149)
VLDL Cholesterol Cal: 22 mg/dL (ref 5–40)

## 2023-03-07 ENCOUNTER — Other Ambulatory Visit: Payer: Self-pay | Admitting: Student

## 2023-03-07 DIAGNOSIS — E782 Mixed hyperlipidemia: Secondary | ICD-10-CM

## 2023-03-12 ENCOUNTER — Other Ambulatory Visit: Payer: Self-pay | Admitting: Cardiology

## 2023-03-19 ENCOUNTER — Other Ambulatory Visit: Payer: Self-pay | Admitting: Cardiology

## 2023-03-22 ENCOUNTER — Encounter (HOSPITAL_COMMUNITY): Payer: Self-pay

## 2023-03-26 ENCOUNTER — Other Ambulatory Visit: Payer: Self-pay

## 2023-03-26 ENCOUNTER — Encounter (HOSPITAL_COMMUNITY)
Admission: RE | Admit: 2023-03-26 | Discharge: 2023-03-26 | Disposition: A | Discharge status: Home / Self Care | Payer: 59 | Source: Ambulatory Visit | Attending: Cardiology | Admitting: Cardiology

## 2023-03-26 DIAGNOSIS — E782 Mixed hyperlipidemia: Secondary | ICD-10-CM | POA: Insufficient documentation

## 2023-03-26 DIAGNOSIS — Z720 Tobacco use: Secondary | ICD-10-CM | POA: Diagnosis not present

## 2023-03-26 DIAGNOSIS — I10 Essential (primary) hypertension: Secondary | ICD-10-CM | POA: Diagnosis present

## 2023-03-26 DIAGNOSIS — I25700 Atherosclerosis of coronary artery bypass graft(s), unspecified, with unstable angina pectoris: Secondary | ICD-10-CM | POA: Insufficient documentation

## 2023-03-26 DIAGNOSIS — E039 Hypothyroidism, unspecified: Secondary | ICD-10-CM

## 2023-03-26 LAB — NM PET CT CARDIAC PERFUSION MULTI W/ABSOLUTE BLOODFLOW
LV dias vol: 70 mL (ref 46–106)
LV sys vol: 22 mL
MBFR: 3.4
Nuc Rest EF: 69 %
Nuc Stress EF: 68 %
Rest MBF: 0.72 ml/g/min
Rest Nuclear Isotope Dose: 17.6 mCi
ST Depression (mm): 0 mm
Stress MBF: 2.45 ml/g/min
Stress Nuclear Isotope Dose: 17.7 mCi

## 2023-03-26 MED ORDER — REGADENOSON 0.4 MG/5ML IV SOLN
INTRAVENOUS | Status: AC
  Filled 2023-03-26: qty 5

## 2023-03-26 MED ORDER — RUBIDIUM RB82 GENERATOR (RUBYFILL)
17.5800 | PACK | Freq: Once | INTRAVENOUS | Status: AC
  Administered 2023-03-26: 17.58 via INTRAVENOUS

## 2023-03-26 MED ORDER — LEVOTHYROXINE SODIUM 88 MCG PO TABS
88.0000 ug | ORAL_TABLET | Freq: Every day | ORAL | 3 refills | Status: DC
Start: 2023-03-26 — End: 2023-06-04

## 2023-03-26 MED ORDER — REGADENOSON 0.4 MG/5ML IV SOLN: 0.4000 mg | Freq: Once | INTRAVENOUS | Status: DC

## 2023-03-26 MED ORDER — RUBIDIUM RB82 GENERATOR (RUBYFILL)
17.6100 | PACK | Freq: Once | INTRAVENOUS | Status: AC
  Administered 2023-03-26: 17.61 via INTRAVENOUS

## 2023-03-26 NOTE — Progress Notes (Signed)
Tolerated scan well 

## 2023-06-04 ENCOUNTER — Other Ambulatory Visit: Payer: Self-pay

## 2023-06-04 DIAGNOSIS — E039 Hypothyroidism, unspecified: Secondary | ICD-10-CM

## 2023-06-05 ENCOUNTER — Telehealth: Payer: Self-pay

## 2023-06-05 MED ORDER — LEVOTHYROXINE SODIUM 88 MCG PO TABS
88.0000 ug | ORAL_TABLET | Freq: Every day | ORAL | 3 refills | Status: DC
Start: 2023-06-05 — End: 2024-01-24

## 2023-06-05 NOTE — Telephone Encounter (Signed)
Received VM from [facility] regarding levothyroxine prescription. They need provider authorization to change manufacturer from Amneal to Lupen.   Ref number: [id number].   Please let me know if this is appropriate. I can then call the pharmacy with approval.   Thanks.   Veronda Prude, RN

## 2023-06-06 NOTE — Telephone Encounter (Signed)
Spoke with Pharmacist and gave verbal ok for manufacturer change.

## 2023-07-24 ENCOUNTER — Other Ambulatory Visit: Payer: Self-pay | Admitting: Student

## 2023-07-24 DIAGNOSIS — K580 Irritable bowel syndrome with diarrhea: Secondary | ICD-10-CM

## 2023-09-23 ENCOUNTER — Encounter: Payer: Self-pay | Admitting: Student

## 2023-09-23 ENCOUNTER — Ambulatory Visit: Admitting: Student

## 2023-09-23 VITALS — BP 123/67 | HR 68 | Ht 60.0 in | Wt 153.5 lb

## 2023-09-23 DIAGNOSIS — R109 Unspecified abdominal pain: Secondary | ICD-10-CM | POA: Diagnosis not present

## 2023-09-23 DIAGNOSIS — F32A Depression, unspecified: Secondary | ICD-10-CM | POA: Diagnosis not present

## 2023-09-23 DIAGNOSIS — Z13820 Encounter for screening for osteoporosis: Secondary | ICD-10-CM

## 2023-09-23 DIAGNOSIS — R519 Headache, unspecified: Secondary | ICD-10-CM | POA: Diagnosis not present

## 2023-09-23 DIAGNOSIS — Z1211 Encounter for screening for malignant neoplasm of colon: Secondary | ICD-10-CM | POA: Diagnosis not present

## 2023-09-23 DIAGNOSIS — F419 Anxiety disorder, unspecified: Secondary | ICD-10-CM

## 2023-09-23 LAB — POCT SEDIMENTATION RATE: POCT SED RATE: 3 mm/h (ref 0–22)

## 2023-09-23 MED ORDER — TIZANIDINE HCL 4 MG PO TABS: 4.0000 mg | ORAL_TABLET | Freq: Three times a day (TID) | ORAL | 0 refills | Status: DC | PRN

## 2023-09-23 MED ORDER — LIDOCAINE 5 % EX PTCH: 1.0000 | MEDICATED_PATCH | CUTANEOUS | 0 refills | Status: DC

## 2023-09-23 MED ORDER — TRAMADOL HCL 50 MG PO TABS: 50.0000 mg | ORAL_TABLET | Freq: Three times a day (TID) | ORAL | 0 refills | Status: AC | PRN

## 2023-09-23 NOTE — Patient Instructions (Signed)
 It was great to see you today! Thank you for choosing [facility] for your primary care.  Today we addressed: Can use tizanidine as needed for pain and lidocaine patches  Cologuard will be sent to your house  DEXA - [phone] I have referred to psychiatry as well  Return in a couple of months   If you haven't already, sign up for My Chart to have easy access to your labs results, and communication with your primary care physician.   Please arrive 15 minutes before your appointment to ensure smooth check in process.  We appreciate your efforts in making this happen.  Thank you for allowing me to participate in your care, Alfredo Martinez, MD 09/23/2023, 9:07 AM PGY-3, [facility]

## 2023-09-23 NOTE — Assessment & Plan Note (Signed)
 Severe anxiety and panic attacks exacerbated over the last month, likely related to stress from her son's condition and lack of rest. Previous use of Xanax was somewhat helpful, but she is unwilling to try SSRIs like Zoloft, Lexapro, or Effexor due to adverse effects. Experiences panic attacks in public, especially when driving downtown, leading to significant distress and avoidance behavior. She has tried to attend jury duty twice but was unsuccessful due to panic attacks. - Refer to psychiatry for evaluation and management of anxiety and panic attacks. - Provide therapy options for consideration. - Write a letter to excuse from jury duty due to severe anxiety and panic attacks.

## 2023-09-23 NOTE — Progress Notes (Signed)
 SUBJECTIVE:   CHIEF COMPLAINT / HPI:    Anxiety  Depression:  The patient, with a history of heart disease and IBS-D, presents with severe anxiety and panic attacks.  She reports that these symptoms have worsened over the past month, attributing this to the stress of caring for her son who has been diagnosed with REM sleep behavior disorder (RBD).  She describes her son's condition as severe, with episodes of screaming, crying, and physical harm during sleep. This has resulted in her staying up all night to watch over him, further exacerbating her anxiety. The patient has previously taken Xanax for her anxiety and panic attacks and found it helpful. However, she is reluctant to take SSRIs such as Zoloft, Lexapro, or Effexor due to previous negative experiences with these medications causing her to become irritable and "snappy." She expresses a preference for managing her symptoms without these medications.  Left Side Pain: In addition to her anxiety, the patient also reports a recent episode of severe left flank pain.  She describes the pain as being under her rib cage and radiating down her side.  The pain was so severe that she called an ambulance, but did not go to the hospital.  She has been taking tizanidine, a muscle relaxant, which she reports has helped with the pain.  She also mentions having chronic IBS-D, which causes constant abdominal pain and diarrhea.  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  - no weakness  - no sz, fever, chills, chest pain, dyspnea    PERTINENT  PMH / PSH: Reviewed   OBJECTIVE:   BP 123/67   Pulse 68   Ht 5' (1.524 m)   Wt 153 lb 8 oz (69.6 kg)   SpO2 100%   BMI 29.98 kg/m   General: Alert and oriented in no apparent distress Heart: Regular rate and rhythm with no murmurs appreciated Lungs: CTA bilaterally, no wheezing Abdomen: Bowel sounds  present, no abdominal pain, no flank pain,  Skin: Warm and dry, no rash  Extremities: No lower extremity edema Neuro: CN II: PERRL CN III, IV,VI: EOMI CV V: Normal sensation in V1, V2, V3 CVII: Symmetric smile and brow raise CN VIII: Normal hearing CN XI: 5/5 shoulder shrug UE and LE strength 5/5 Normal sensation in UE and LE bilaterally     ASSESSMENT/PLAN:   Assessment & Plan Anxiety and depression Severe anxiety and panic attacks exacerbated over the last month, likely related to stress from her son's condition and lack of rest. Previous use of Xanax was somewhat helpful, but she is unwilling to try SSRIs like Zoloft, Lexapro, or Effexor due to adverse effects. Experiences panic attacks in public, especially when driving downtown, leading to significant distress and avoidance behavior. She has tried to attend jury duty twice but was unsuccessful due to panic attacks. - Refer to psychiatry for evaluation and management of anxiety and panic attacks. - Provide therapy options for consideration. - Write a letter to excuse from jury duty due to severe anxiety and panic attacks. Nonintractable headache, unspecified chronicity pattern, unspecified headache type Concerning history but reassured by history of migraines that are similar in nature. GCA less likely but given age, inflammatory markers and basic labs. 4 tabs of toradol with scheduled tylenol. Return later in the week if still persists. STRICT return precautions for worsening symptoms or  neurological deficiency. No infectious symptoms.  Left sided abdominal pain of unknown cause Intermittent left flank pain with no urinary symptoms or rash, likely musculoskeletal in nature. The pain was severe and associated with elevated blood pressure, but resolved with tizanidine. Differential diagnosis includes musculoskeletal pain, renal issues, or shingles without rash. - Refill tizanidine for muscle spasms. - Prescribe lidocaine patches for  additional pain relief. - Advise to report if pain persists despite treatment or if a rash develops. Screen for colon cancer Chronic IBS-D with constant abdominal pain and diarrhea. Previously seen a GI doctor and was prescribed Valium, which provided some relief. Prefers Cologuard over colonoscopy due to severe diarrhea. - Order Cologuard for colorectal cancer screening.  Screening for osteoporosis DEXA ordered      Alfredo Martinez, MD [facility]

## 2023-09-23 NOTE — Addendum Note (Signed)
 Addended by: Jennette Bill on: 09/23/2023 10:43 AM   Modules accepted: Orders

## 2023-09-23 NOTE — Assessment & Plan Note (Signed)
 Concerning history but reassured by history of migraines that are similar in nature. GCA less likely but given age, inflammatory markers and basic labs. 4 tabs of toradol with scheduled tylenol. Return later in the week if still persists. STRICT return precautions for worsening symptoms or neurological deficiency. No infectious symptoms.

## 2023-09-24 ENCOUNTER — Encounter: Payer: Self-pay | Admitting: Student

## 2023-09-24 LAB — CBC WITH DIFFERENTIAL/PLATELET
Basophils Absolute: 0 10*3/uL (ref 0.0–0.2)
Basos: 0 %
EOS (ABSOLUTE): 0.2 10*3/uL (ref 0.0–0.4)
Eos: 2 %
Hematocrit: 49.5 % — ABNORMAL HIGH (ref 34.0–46.6)
Hemoglobin: 16.3 g/dL — ABNORMAL HIGH (ref 11.1–15.9)
Immature Grans (Abs): 0 10*3/uL (ref 0.0–0.1)
Immature Granulocytes: 0 %
Lymphocytes Absolute: 3.1 10*3/uL (ref 0.7–3.1)
Lymphs: 35 %
MCH: 31.4 pg (ref 26.6–33.0)
MCHC: 32.9 g/dL (ref 31.5–35.7)
MCV: 95 fL (ref 79–97)
Monocytes Absolute: 0.4 10*3/uL (ref 0.1–0.9)
Monocytes: 5 %
Neutrophils Absolute: 5.2 10*3/uL (ref 1.4–7.0)
Neutrophils: 58 %
Platelets: 269 10*3/uL (ref 150–450)
RBC: 5.19 x10E6/uL (ref 3.77–5.28)
RDW: 12.6 % (ref 11.7–15.4)
WBC: 9 10*3/uL (ref 3.4–10.8)

## 2023-09-24 LAB — COMPREHENSIVE METABOLIC PANEL WITH GFR
ALT: 13 IU/L (ref 0–32)
AST: 15 IU/L (ref 0–40)
Albumin: 4.7 g/dL (ref 3.9–4.9)
Alkaline Phosphatase: 62 IU/L (ref 44–121)
BUN/Creatinine Ratio: 17 (ref 12–28)
BUN: 11 mg/dL (ref 8–27)
Bilirubin Total: 0.2 mg/dL (ref 0.0–1.2)
CO2: 23 mmol/L (ref 20–29)
Calcium: 10.2 mg/dL (ref 8.7–10.3)
Chloride: 101 mmol/L (ref 96–106)
Creatinine, Ser: 0.63 mg/dL (ref 0.57–1.00)
Globulin, Total: 2.2 g/dL (ref 1.5–4.5)
Glucose: 88 mg/dL (ref 70–99)
Potassium: 5.1 mmol/L (ref 3.5–5.2)
Sodium: 139 mmol/L (ref 134–144)
Total Protein: 6.9 g/dL (ref 6.0–8.5)
eGFR: 98 mL/min/{1.73_m2} (ref >59)

## 2023-09-24 LAB — C-REACTIVE PROTEIN: CRP: 1 mg/L (ref 0–10)

## 2023-10-02 LAB — COLOGUARD: COLOGUARD: NEGATIVE

## 2023-10-21 ENCOUNTER — Encounter (HOSPITAL_COMMUNITY): Payer: Self-pay | Admitting: Family

## 2023-10-21 ENCOUNTER — Other Ambulatory Visit: Payer: Self-pay

## 2023-10-21 ENCOUNTER — Ambulatory Visit (HOSPITAL_BASED_OUTPATIENT_CLINIC_OR_DEPARTMENT_OTHER): Admitting: Family

## 2023-10-21 VITALS — BP 138/80 | HR 103 | Ht 60.0 in | Wt 154.0 lb

## 2023-10-21 DIAGNOSIS — F419 Anxiety disorder, unspecified: Secondary | ICD-10-CM | POA: Diagnosis not present

## 2023-10-21 DIAGNOSIS — J449 Chronic obstructive pulmonary disease, unspecified: Secondary | ICD-10-CM | POA: Insufficient documentation

## 2023-10-21 DIAGNOSIS — F32A Depression, unspecified: Secondary | ICD-10-CM | POA: Diagnosis not present

## 2023-10-21 MED ORDER — TRAZODONE HCL 50 MG PO TABS: 50.0000 mg | ORAL_TABLET | Freq: Every day | ORAL | 0 refills | Status: DC

## 2023-10-21 MED ORDER — BUPROPION HCL 75 MG PO TABS: 75.0000 mg | ORAL_TABLET | Freq: Every day | ORAL | 0 refills | Status: DC
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
## 2023-10-21 NOTE — Addendum Note (Signed)
 Addended by: Levester Reagin on: 10/21/2023 10:59 AM   Modules accepted: Level of Service

## 2023-11-04 ENCOUNTER — Telehealth (HOSPITAL_BASED_OUTPATIENT_CLINIC_OR_DEPARTMENT_OTHER): Admitting: Family

## 2023-11-04 DIAGNOSIS — F419 Anxiety disorder, unspecified: Secondary | ICD-10-CM

## 2023-11-04 DIAGNOSIS — F32A Depression, unspecified: Secondary | ICD-10-CM | POA: Diagnosis not present

## 2023-11-04 MED ORDER — ESCITALOPRAM OXALATE 10 MG PO TABS: ORAL_TABLET | ORAL | 0 refills | Status: DC

## 2023-11-04 MED ORDER — TRAZODONE HCL 50 MG PO TABS: 50.0000 mg | ORAL_TABLET | Freq: Every day | ORAL | 0 refills | Status: DC

## 2023-11-04 NOTE — Progress Notes (Unsigned)
 Virtual Visit via Video Note  I connected with [name] on 11/04/23 at  1:30 PM EDT by a video enabled telemedicine application and verified that I am speaking with the correct person using two identifiers.  Location: Patient: Home Provider: Office   I discussed the limitations of evaluation and management by telemedicine and the availability of in person appointments. The patient expressed understanding and agreed to proceed.   I discussed the assessment and treatment plan with the patient. The patient was provided an opportunity to ask questions and all were answered. The patient agreed with the plan and demonstrated an understanding of the instructions.   The patient was advised to call back or seek an in-person evaluation if the symptoms worsen or if the condition fails to improve as anticipated.  I provided 10 minutes of non-face-to-face time during this encounter.   Levester Reagin, NP   [facility] MD/PA/NP OP Progress Note  11/04/2023 1:19 PM [name]  MRN:  [id number]  Chief Complaint: Medication management  HPI: [name] is  68 year old female was seen and evaluated via care agility.  She was initially prescribed Wellbutrin  and trazodone  for mood stabilization.  States she took 1 dose of Wellbutrin  and is unable to tolerate medication.  Did not specify any adverse reaction only to states that medication did not help?  Reports ongoing worsening depression and sadness.  Discussed initiating Lexapro 10 mg with titration to 20 mg.  reports she continues to take trazodone  coupled with Tylenol  PM for sleep disturbance.  Education provided risks related to taking Tylenol  nightly.  She denied that she is interested in starting Zoloft due to adverse reaction reported by her son.    [name] is walking around her house during this virtual assessement she is alert/oriented x 4; calm/cooperative; and mood congruent with affect.  Patient is speaking in a clear tone at moderate  volume, and normal pace; with good eye contact.  ***His/Her thought process is coherent and relevant; There is no indication that ***he/she is currently responding to internal/external stimuli or experiencing delusional thought content.  Patient denies suicidal/self-harm/homicidal ideation, psychosis, and paranoia.  Patient has remained calm throughout assessment and has answered questions appropriately.   Visit Diagnosis: No diagnosis found.  Past Psychiatric History: ***  Past Medical History:  Past Medical History:  Diagnosis Date   Anemia    Anxiety    Blood transfusion    Chest pain, cardiac    Has chronic chest pain.    Complication of anesthesia    "my blood pressure will drop out on me"   Coronary artery disease    a) s/p CABG x 2 in [place]  2002. b)stenting to the SVG-OM in 2009. c)DES to the saphenous vein graft to the ramus in March 2012. d) s/p PTCA/DES to native ramus intermedius in September 2012. Last cath 03/2011 with minimal nonobstructive disease  e) indeterminant myoview  09/2011 but atypical CP & no perfusion defect in ramus intermediate distribution, EF 52%   Depression    Embolism - blood clot 2002   "in my heart after bypass"   Enterocolitis    Gastroparesis    GERD (gastroesophageal reflux disease)    GI bleeding    Hematuria 09/25/11   "lately"   Hx: UTI (urinary tract infection)    multiple   Hyperlipidemia    Hypertension    Hypothyroidism    Migraines    h/o   PTSD (post-traumatic stress disorder)    Tobacco abuse  Past Surgical History:  Procedure Laterality Date   ABDOMINAL HYSTERECTOMY  02/24/1979   BIOPSY STOMACH  09/25/2011   "several in the last year; all negative; via endoscopy"   BREAST EXCISIONAL BIOPSY Right    BREAST SURGERY     right biopsy   CHOLECYSTECTOMY  02/24/1999   CORONARY ANGIOPLASTY WITH STENT PLACEMENT     "lots; they've all collapsed; last time dr took native vein &  turned it around & put stents in it before  attaching"   CORONARY ARTERY BYPASS GRAFT  06/25/2000   CABG X2   CYSTO WITH HYDRODISTENSION  03/28/2012   Procedure: CYSTOSCOPY/HYDRODISTENSION;  Surgeon: Devorah Fonder, MD;  Location: [facility];  Service: Urology;  Laterality: N/A;   CYSTOSCOPY W/ RETROGRADES  03/28/2012   Procedure: CYSTOSCOPY WITH RETROGRADE PYELOGRAM;  Surgeon: Devorah Fonder, MD;  Location: [facility];  Service: Urology;  Laterality: Bilateral;   CYSTOSCOPY WITH INJECTION  03/28/2012   Procedure: CYSTOSCOPY WITH INJECTION;  Surgeon: Devorah Fonder, MD;  Location: [facility];  Service: Urology;  Laterality: N/A;  Marcaine  and Pyridium    PILONIDAL CYST / SINUS EXCISION  02/24/1979   "was wrapped around my spinal cord; Dr. Hilary Lovett got almost all of it except a tiny bit; said it was really deep; packed fatty tissue in the hole; really bothers me alot"   SALPINGOOPHORECTOMY  02/24/1979   "2 surgeries; 1st right; then left"   TMJ ARTHROPLASTY  02/24/1979   right side; "I've had to have it twice"   VESICOVAGINAL FISTULA CLOSURE W/ TAH  06/25/1981    Family Psychiatric History:   Family History:  Family History  Problem Relation Age of Onset   Emphysema Mother        smoked   Asthma Mother    Breast cancer Mother    Heart disease Father    Heart disease Paternal Grandfather     Social History:  Social History   Socioeconomic History   Marital status: Widowed    Spouse name: Not on file   Number of children: 2   Years of education: Not on file   Highest education level: GED or equivalent  Occupational History   Occupation: Disabled  Tobacco Use   Smoking status: Every Day    Current packs/day: 1.00    Average packs/day: 1 pack/day for 15.0 years (15.0 ttl pk-yrs)    Types: Cigarettes   Smokeless tobacco: Never  Vaping Use   Vaping status: Never Used  Substance and Sexual Activity   Alcohol use: No   Drug use: Not Currently    Types: Marijuana    Comment: "as a teen"   Sexual activity: Not  Currently  Other Topics Concern   Not on file  Social History Narrative   Not on file   Social Drivers of Health   Financial Resource Strain: Medium Risk (09/20/2023)   Overall Financial Resource Strain (CARDIA)    Difficulty of Paying Living Expenses: Somewhat hard  Food Insecurity: Food Insecurity Present (09/20/2023)   Hunger Vital Sign    Worried About Running Out of Food in the Last Year: Sometimes true    Ran Out of Food in the Last Year: Sometimes true  Transportation Needs: Unmet Transportation Needs (09/20/2023)   PRAPARE - Transportation    Lack of Transportation (Medical): Yes    Lack of Transportation (Non-Medical): Yes  Physical Activity: Inactive (09/20/2023)   Exercise Vital Sign    Days of Exercise per Week: 0 days  Minutes of Exercise per Session: 30 min  Stress: Stress Concern Present (09/20/2023)   [employer] of Occupational Health - Occupational Stress Questionnaire    Feeling of Stress : Very much  Social Connections: Socially Isolated (09/20/2023)   Social Connection and Isolation Panel [NHANES]    Frequency of Communication with Friends and Family: Twice a week    Frequency of Social Gatherings with Friends and Family: Never    Attends Religious Services: Never    Database administrator or Organizations: No    Attends Banker Meetings: Never    Marital Status: Widowed    Allergies:  Allergies  Allergen Reactions   Ranitidine Hives   Cimetidine Hives    Pt reported   Sulfamethoxazole-Trimethoprim     Patient couldn't give specific allergy   Sulfa Antibiotics Other (See Comments)    Childhood reaction    Metabolic Disorder Labs: Lab Results  Component Value Date   HGBA1C 5.8 11/05/2016   MPG 117 (H) 03/08/2011   MPG 114 09/06/2010   No results found for: "PROLACTIN" Lab Results  Component Value Date   CHOL 129 03/04/2023   TRIG 122 03/04/2023   HDL 39 (L) 03/04/2023   CHOLHDL 3.3 03/04/2023   VLDL 28.4 02/04/2020    LDLCALC 68 03/04/2023   LDLCALC 62 11/16/2021   Lab Results  Component Value Date   TSH 2.430 03/04/2023   TSH 4.160 03/26/2022    Therapeutic Level Labs: No results found for: "LITHIUM" No results found for: "VALPROATE" No results found for: "CBMZ"  Current Medications: Current Outpatient Medications  Medication Sig Dispense Refill   amLODipine  (NORVASC ) 5 MG tablet TAKE 1 TABLET BY MOUTH DAILY 30 tablet 11   buPROPion  (WELLBUTRIN ) 75 MG tablet Take 1 tablet (75 mg total) by mouth daily. 30 tablet 0   clopidogrel  (PLAVIX ) 75 MG tablet TAKE 1 TABLET BY MOUTH DAILY 30 tablet 11   Dexlansoprazole  30 MG capsule DR TAKE 1 CAPSULE(30 MG) BY MOUTH DAILY 90 capsule 2   diphenhydramine -acetaminophen  (TYLENOL  PM) 25-500 MG TABS Take 1 tablet by mouth at bedtime as needed (pain, sleep).     fenofibrate  micronized (LOFIBRA) 67 MG capsule TAKE 1 CAPSULE BY MOUTH DAILY  BEFORE BREAKFAST 100 capsule 2   levothyroxine  (SYNTHROID ) 88 MCG tablet Take 1 tablet (88 mcg total) by mouth daily before breakfast. 90 tablet 3   lidocaine  (LIDODERM ) 5 % Place 1 patch onto the skin daily. Remove & Discard patch within 12 hours or as directed by MD 30 patch 0   metoprolol  succinate (TOPROL -XL) 50 MG 24 hr tablet TAKE 1 TABLET BY MOUTH DAILY  WITH OR IMMEDIATELY FOLLOWING A  MEAL 30 tablet 11   nitroGLYCERIN  (NITROSTAT ) 0.4 MG SL tablet Place 1 tablet (0.4 mg total) under the tongue every 5 (five) minutes as needed for chest pain. 25 tablet 11   rosuvastatin  (CRESTOR ) 40 MG tablet TAKE 1 TABLET BY MOUTH DAILY 30 tablet 11   tiZANidine  (ZANAFLEX ) 4 MG tablet Take 1 tablet (4 mg total) by mouth every 8 (eight) hours as needed for muscle spasms. 15 tablet 0   traZODone  (DESYREL ) 50 MG tablet Take 1 tablet (50 mg total) by mouth at bedtime. 30 tablet 0   No current facility-administered medications for this visit.     Musculoskeletal: Virtual assessment   Psychiatric Specialty Exam: Review of Systems  There  were no vitals taken for this visit.There is no height or weight on file to calculate BMI.  General Appearance: Casual  Eye Contact:  Good  Speech:  Clear and Coherent  Volume:  Normal  Mood:  Anxious and Depressed  Affect:  Congruent  Thought Process:  Coherent  Orientation:  Full (Time, Place, and Person)  Thought Content: Logical   Suicidal Thoughts:  No  Homicidal Thoughts:  No  Memory:  Immediate;   Good Recent;   Good  Judgement:  Good  Insight:  Good  Psychomotor Activity:  Normal  Concentration:  Concentration: Fair  Recall:  Good  Fund of Knowledge: Good  Language: Good  Akathisia:  No  Handed:  Right  AIMS (if indicated): not done  Assets:  Communication Skills Desire for Improvement  ADL's:  Intact  Cognition: WNL  Sleep:  Poor   Screenings: GAD-7    Flowsheet Row Office Visit from 02/04/2020 in [facility]  Total GAD-7 Score 5      PHQ2-9    Flowsheet Row Office Visit from 09/23/2023 in [facility] - [facility] Dept Of [place]. [facility] Clinical Support from 11/05/2022 in [facility] - [facility] Dept Of [place]. [facility] Office Visit from 03/26/2022 in [facility] - A Dept Of [place]. [facility] Office Visit from 01/09/2022 in [facility] - A Dept Of [place]. [facility] Office Visit from 02[facility]04/2020 in [facility] [place]  PHQ-2 Total Score 6 5 5 1 3   PHQ-9 Total Score 16 12 18 6 6       Flowsheet Row UC from 05/02/2022 in [facility] at [facility] UC from 02/28/2021 in [facility] at [facility] RISK CATEGORY No Risk Error: Question 6 not populated        Assessment and Plan: Lareine Augustin 68 year old female presents for medication management follow-up appointment.  She was initiated on Wellbutrin  and trazodone  at previous appointment.  States she is unable to tolerate  Wellbutrin  so she took 1 dose and discontinued.  She reports she continues to take trazodone  nightly coupled with Tylenol  PM.  Discussed discontinuing OTC medication due to liver and GI issues. Patient appeared receptive to plan.  [place] to follow-up 1 month.    Collaboration of Care: Collaboration of Care: Medication Management AEB May increase Trazodone  50 mg -100 mg PRN patient to start Lexapro 10 mg daily   Patient/Guardian was advised Release of Information must be obtained prior to any record release in order to collaborate their care with an outside provider. Patient/Guardian was advised if they have not already done so to contact the registration department to sign all necessary forms in order for us  to release information regarding their care.   Consent: Patient/Guardian gives verbal consent for treatment and assignment of benefits for services provided during this visit. Patient/Guardian expressed understanding and agreed to proceed.    Levester Reagin, NP 11/04/2023, 1:19 PM

## 2023-11-05 NOTE — Addendum Note (Signed)
 Addended by: Levester Reagin on: 11/05/2023 10:42 AM   Modules accepted: Level of Service

## 2023-11-22 ENCOUNTER — Other Ambulatory Visit: Payer: Self-pay | Admitting: Student

## 2023-11-22 ENCOUNTER — Other Ambulatory Visit: Payer: Self-pay | Admitting: Cardiology

## 2023-11-22 DIAGNOSIS — E782 Mixed hyperlipidemia: Secondary | ICD-10-CM

## 2023-11-27 ENCOUNTER — Other Ambulatory Visit (HOSPITAL_COMMUNITY): Payer: Self-pay | Admitting: *Deleted

## 2023-11-27 MED ORDER — ESCITALOPRAM OXALATE 20 MG PO TABS: ORAL_TABLET | ORAL | 0 refills | Status: DC

## 2023-12-02 ENCOUNTER — Telehealth (HOSPITAL_BASED_OUTPATIENT_CLINIC_OR_DEPARTMENT_OTHER): Admitting: Family

## 2023-12-02 DIAGNOSIS — F419 Anxiety disorder, unspecified: Secondary | ICD-10-CM

## 2023-12-02 DIAGNOSIS — F32A Depression, unspecified: Secondary | ICD-10-CM | POA: Diagnosis not present

## 2023-12-02 MED ORDER — ESCITALOPRAM OXALATE 20 MG PO TABS: ORAL_TABLET | ORAL | 0 refills | Status: DC

## 2023-12-02 MED ORDER — TRAZODONE HCL 50 MG PO TABS: 50.0000 mg | ORAL_TABLET | Freq: Every day | ORAL | 0 refills | Status: DC

## 2023-12-02 MED ORDER — TRAZODONE HCL 100 MG PO TABS: 50.0000 mg | ORAL_TABLET | Freq: Every day | ORAL | 1 refills | Status: DC

## 2023-12-02 NOTE — Progress Notes (Signed)
 Virtual Visit via Video Note  I connected with [name] on 12/02/23 at  1:30 PM EDT by a video enabled telemedicine application and verified that I am speaking with the correct person using two identifiers.  Location: Patient: \Home Provider: Office   I discussed the limitations of evaluation and management by telemedicine and the availability of in person appointments. The patient expressed understanding and agreed to proceed.      I discussed the assessment and treatment plan with the patient. The patient was provided an opportunity to ask questions and all were answered. The patient agreed with the plan and demonstrated an understanding of the instructions.   The patient was advised to call back or seek an in-person evaluation if the symptoms worsen or if the condition fails to improve as anticipated.  I provided 20 minutes of non-face-to-face time during this encounter.   Levester Reagin, NP   [facility] MD/PA/NP OP Progress Note  12/02/2023 2:35 PM [name]  MRN:  [id number]  Chief Complaint: Medication management  HPI: [name] is a 68 year old Caucasian female presents for medication management appointment.  Carries a diagnosis related to depression and anxiety and sleep disturbance.  Recently had a couple of medication adjustments which she reports taking trazodone  100 mg and Lexapro  20 mg she feels stable with current medication dosage.  States she is recently was out of medication and was unable to get a hold of anyone in the office to get her medications refilled.   [name] does report some increased anxiety since being off of the Lexapro .  Has since restarted medication.  No other medication side effects noted.  States she continues to deal with current life psychosocial stressors to include financial constraints.  States all of her bills are paid however, she has no extra spending money.  Reports she has plans to make payment arrangements due to multiple office  visits.  She reports she has attempted to limit the amount of Tylenol  p.m. she uses at night due to IBS and GI issues.  States trazodone  does help with her sleep disturbance however, states when she takes trazodone  medications for multiple nights in a row she wakes up " in a mess."  No concerns related to suicidal or homicidal ideations.  Denies auditory visual hallucinations.  She is alert oriented x 3.  Answered all questions appropriately.  States she is grateful to be turning 68 years old tomorrow.  "  I have outlived my mother 13 years so things could be a lot worse," reports a good appetite.  States she is resting well throughout the night.  Continue to discourage utilization with Tylenol  PM.    Visit Diagnosis:    ICD-10-CM   1. Anxiety and depression  F41.9    F32.A       Past Psychiatric History:   Past Medical History:  Past Medical History:  Diagnosis Date   Anemia    Anxiety    Blood transfusion    Chest pain, cardiac    Has chronic chest pain.    Complication of anesthesia    "my blood pressure will drop out on me"   Coronary artery disease    a) s/p CABG x 2 in [place]  2002. b)stenting to the SVG-OM in 2009. c)DES to the saphenous vein graft to the ramus in March 2012. d) s/p PTCA/DES to native ramus intermedius in September 2012. Last cath 03/2011 with minimal nonobstructive disease  e) indeterminant myoview  09/2011 but atypical CP & no  perfusion defect in ramus intermediate distribution, EF 52%   Depression    Embolism - blood clot 2002   "in my heart after bypass"   Enterocolitis    Gastroparesis    GERD (gastroesophageal reflux disease)    GI bleeding    Hematuria 09/25/11   "lately"   Hx: UTI (urinary tract infection)    multiple   Hyperlipidemia    Hypertension    Hypothyroidism    Migraines    h/o   PTSD (post-traumatic stress disorder)    Tobacco abuse     Past Surgical History:  Procedure Laterality Date   ABDOMINAL HYSTERECTOMY  02/24/1979    BIOPSY STOMACH  09/25/2011   "several in the last year; all negative; via endoscopy"   BREAST EXCISIONAL BIOPSY Right    BREAST SURGERY     right biopsy   CHOLECYSTECTOMY  02/24/1999   CORONARY ANGIOPLASTY WITH STENT PLACEMENT     "lots; they've all collapsed; last time dr took native vein &  turned it around & put stents in it before attaching"   CORONARY ARTERY BYPASS GRAFT  06/25/2000   CABG X2   CYSTO WITH HYDRODISTENSION  03/28/2012   Procedure: CYSTOSCOPY/HYDRODISTENSION;  Surgeon: Devorah Fonder, MD;  Location: [facility];  Service: Urology;  Laterality: N/A;   CYSTOSCOPY W/ RETROGRADES  03/28/2012   Procedure: CYSTOSCOPY WITH RETROGRADE PYELOGRAM;  Surgeon: Devorah Fonder, MD;  Location: [facility];  Service: Urology;  Laterality: Bilateral;   CYSTOSCOPY WITH INJECTION  03/28/2012   Procedure: CYSTOSCOPY WITH INJECTION;  Surgeon: Devorah Fonder, MD;  Location: [facility];  Service: Urology;  Laterality: N/A;  Marcaine  and Pyridium    PILONIDAL CYST / SINUS EXCISION  02/24/1979   "was wrapped around my spinal cord; Dr. Hilary Lovett got almost all of it except a tiny bit; said it was really deep; packed fatty tissue in the hole; really bothers me alot"   SALPINGOOPHORECTOMY  02/24/1979   "2 surgeries; 1st right; then left"   TMJ ARTHROPLASTY  02/24/1979   right side; "I've had to have it twice"   VESICOVAGINAL FISTULA CLOSURE W/ TAH  06/25/1981    Family Psychiatric History:   Family History:  Family History  Problem Relation Age of Onset   Emphysema Mother        smoked   Asthma Mother    Breast cancer Mother    Heart disease Father    Heart disease Paternal Grandfather     Social History:  Social History   Socioeconomic History   Marital status: Widowed    Spouse name: Not on file   Number of children: 2   Years of education: Not on file   Highest education level: GED or equivalent  Occupational History   Occupation: Disabled  Tobacco Use   Smoking status: Every  Day    Current packs/day: 1.00    Average packs/day: 1 pack/day for 15.0 years (15.0 ttl pk-yrs)    Types: Cigarettes   Smokeless tobacco: Never  Vaping Use   Vaping status: Never Used  Substance and Sexual Activity   Alcohol use: No   Drug use: Not Currently    Types: Marijuana    Comment: "as a teen"   Sexual activity: Not Currently  Other Topics Concern   Not on file  Social History Narrative   Not on file   Social Drivers of Health   Financial Resource Strain: Medium Risk (09/20/2023)   Overall Financial Resource Strain (CARDIA)  Difficulty of Paying Living Expenses: Somewhat hard  Food Insecurity: Food Insecurity Present (09/20/2023)   Hunger Vital Sign    Worried About Running Out of Food in the Last Year: Sometimes true    Ran Out of Food in the Last Year: Sometimes true  Transportation Needs: Unmet Transportation Needs (09/20/2023)   PRAPARE - Transportation    Lack of Transportation (Medical): Yes    Lack of Transportation (Non-Medical): Yes  Physical Activity: Unknown (09/20/2023)   Exercise Vital Sign    Days of Exercise per Week: 0 days    Minutes of Exercise per Session: Not on file  Recent Concern: Physical Activity - Inactive (09/20/2023)   Exercise Vital Sign    Days of Exercise per Week: 0 days    Minutes of Exercise per Session: 30 min  Stress: Stress Concern Present (09/20/2023)   [name] [employer] - Occupational Stress Questionnaire    Feeling of Stress : Very much  Social Connections: Socially Isolated (09/20/2023)   Social Connection and Isolation Panel [NHANES]    Frequency of Communication with Friends and Family: Twice a week    Frequency of Social Gatherings with Friends and Family: Never    Attends Religious Services: Never    Database administrator or Organizations: No    Attends Engineer, structural: Not on file    Marital Status: Widowed    Allergies:  Allergies  Allergen Reactions   Ranitidine Hives    Cimetidine Hives    Pt reported   Sulfamethoxazole-Trimethoprim     Patient couldn't give specific allergy   Sulfa Antibiotics Other (See Comments)    Childhood reaction    Metabolic Disorder Labs: Lab Results  Component Value Date   HGBA1C 5.8 11/05/2016   MPG 117 (H) 03/08/2011   MPG 114 09/06/2010   No results found for: "PROLACTIN" Lab Results  Component Value Date   CHOL 129 03/04/2023   TRIG 122 03/04/2023   HDL 39 (L) 03/04/2023   CHOLHDL 3.3 03/04/2023   VLDL 16.1 02/04/2020   LDLCALC 68 03/04/2023   LDLCALC 62 11/16/2021   Lab Results  Component Value Date   TSH 2.430 03/04/2023   TSH 4.160 03/26/2022    Therapeutic Level Labs: No results found for: "LITHIUM" No results found for: "VALPROATE" No results found for: "CBMZ"  Current Medications: Current Outpatient Medications  Medication Sig Dispense Refill   amLODipine  (NORVASC ) 5 MG tablet TAKE 1 TABLET BY MOUTH DAILY 100 tablet 1   clopidogrel  (PLAVIX ) 75 MG tablet TAKE 1 TABLET BY MOUTH DAILY 30 tablet 11   Dexlansoprazole  30 MG capsule DR TAKE 1 CAPSULE(30 MG) BY MOUTH DAILY 90 capsule 2   diphenhydramine -acetaminophen  (TYLENOL  PM) 25-500 MG TABS Take 1 tablet by mouth at bedtime as needed (pain, sleep).     escitalopram  (LEXAPRO ) 20 MG tablet Take 1 tablet (20 mg total) daily. 90 tablet 0   fenofibrate  micronized (LOFIBRA) 67 MG capsule TAKE 1 CAPSULE BY MOUTH DAILY  BEFORE BREAKFAST 100 capsule 2   levothyroxine  (SYNTHROID ) 88 MCG tablet Take 1 tablet (88 mcg total) by mouth daily before breakfast. 90 tablet 3   lidocaine  (LIDODERM ) 5 % Place 1 patch onto the skin daily. Remove & Discard patch within 12 hours or as directed by MD 30 patch 0   metoprolol  succinate (TOPROL -XL) 50 MG 24 hr tablet TAKE 1 TABLET BY MOUTH DAILY  WITH OR IMMEDIATELY FOLLOWING A  MEAL 30 tablet 11   nitroGLYCERIN  (NITROSTAT ) 0.4  MG SL tablet Place 1 tablet (0.4 mg total) under the tongue every 5 (five) minutes as needed for chest  pain. 25 tablet 11   rosuvastatin  (CRESTOR ) 40 MG tablet TAKE 1 TABLET BY MOUTH DAILY 30 tablet 11   tiZANidine  (ZANAFLEX ) 4 MG tablet Take 1 tablet (4 mg total) by mouth every 8 (eight) hours as needed for muscle spasms. 15 tablet 0   traZODone  (DESYREL ) 100 MG tablet Take 0.5 tablets (50 mg total) by mouth at bedtime. 45 tablet 1   No current facility-administered medications for this visit.     Musculoskeletal: Video assessment  Psychiatric Specialty Exam: Review of Systems  Psychiatric/Behavioral:  Positive for decreased concentration and sleep disturbance. The patient is nervous/anxious.   All other systems reviewed and are negative.   There were no vitals taken for this visit.There is no height or weight on file to calculate BMI.  General Appearance: Casual  Eye Contact:  Good  Speech:  Clear and Coherent  Volume:  Normal  Mood:  Anxious and Depressed  Affect:  Congruent  Thought Process:  Coherent  Orientation:  Full (Time, Place, and Person)  Thought Content: Logical   Suicidal Thoughts:  No  Homicidal Thoughts:  No  Memory:  Immediate;   Good Recent;   Good  Judgement:  Good  Insight:  Good  Psychomotor Activity:  Normal  Concentration:  Concentration: Good  Recall:  Good  Fund of Knowledge: Good  Language: Good  Akathisia:  No  Handed:  Right  AIMS (if indicated): not done  Assets:  Communication Skills Desire for Improvement Resilience Social Support  ADL's:  Intact  Cognition: WNL  Sleep:  Good   Screenings: GAD-7    Flowsheet Row Office Visit from 02/04/2020 in [facility]  Total GAD-7 Score 5      PHQ2-9    Flowsheet Row Office Visit from 09/23/2023 in [facility] [place]. [facility] from 11[facility]05/2022 in [facility] - [facility] Of [place]. [facility] Office Visit from 03/26/2022 in [facility] - [facility] Of [place]. [facility] Office Visit from 01/09/2022 in [facility] - A Dept Of [place]. [facility] Office Visit from 02/04/2020 in [facility] [place]  PHQ-2 Total Score 6 5 5 1 3   PHQ-9 Total Score 16 12 18 6 6       Flowsheet Row UC from 05/02/2022 in [facility] UC from 02/28/2021 in [facility] at [facility] RISK CATEGORY No Risk Error: Question 6 not populated        Assessment and Plan: [name] 68 year old female presents for medication management.  Currently she is prescribed Lexapro  and trazodone  which she reports taking and tolerating well.  Reports she is taking 100 mg nightly with trazodone  states she has cut back as this relates to taking multiple Tylenol  PMs.  Does report that Tylenol  does keep her sleep somewhat so she often has night accidents.  Overall mood stabilized no concerns related to suicidal or homicidal ideations.  Rating her depression anxiety 5 out of 10 with 10 being the worst.  Symptoms of worry related to financial stressors  Major depressive disorder Generalized anxiety disorder Sleep disturbance  Continue Lexapro  20 mg daily Continue trazodone  50 to 100 mg nightly as  needed Encourage patient to discontinue Tylenol  PM  Collaboration of Care: Collaboration of Care: Medication Management AEB follow-up 3 months for medication management  Patient/Guardian was advised Release of Information must be obtained prior to any record release in order to collaborate their care with an outside provider. Patient/Guardian was advised if they have not already done so to contact the registration department to sign all necessary forms in order for us  to release information regarding their care.   Consent: Patient/Guardian gives verbal consent for treatment and assignment of benefits for services provided during this visit. Patient/Guardian expressed understanding and agreed to  proceed.    Levester Reagin, NP 12/02/2023, 2:36 PM

## 2023-12-03 ENCOUNTER — Encounter: Payer: Self-pay | Admitting: *Deleted

## 2023-12-09 ENCOUNTER — Other Ambulatory Visit (HOSPITAL_COMMUNITY): Payer: Self-pay | Admitting: *Deleted

## 2023-12-09 MED ORDER — ESCITALOPRAM OXALATE 20 MG PO TABS: ORAL_TABLET | ORAL | 0 refills | Status: DC

## 2024-01-07 ENCOUNTER — Encounter

## 2024-01-19 ENCOUNTER — Other Ambulatory Visit (HOSPITAL_COMMUNITY): Payer: Self-pay | Admitting: Family

## 2024-01-24 ENCOUNTER — Other Ambulatory Visit: Payer: Self-pay

## 2024-01-24 DIAGNOSIS — E039 Hypothyroidism, unspecified: Secondary | ICD-10-CM

## 2024-01-24 MED ORDER — LEVOTHYROXINE SODIUM 88 MCG PO TABS
88.0000 ug | ORAL_TABLET | Freq: Every day | ORAL | 3 refills | Status: DC
Start: 2024-01-24 — End: 2024-04-01

## 2024-01-24 NOTE — Telephone Encounter (Signed)
 Chart reviewed  -Fairy Amy, MD

## 2024-01-30 ENCOUNTER — Ambulatory Visit

## 2024-01-30 VITALS — BP 117/72 | HR 58 | Ht 60.0 in | Wt 149.0 lb

## 2024-01-30 DIAGNOSIS — Z0000 Encounter for general adult medical examination without abnormal findings: Secondary | ICD-10-CM | POA: Diagnosis not present

## 2024-01-30 NOTE — Patient Instructions (Signed)
 [name] , Thank you for taking time out of your busy schedule to complete your Annual Wellness Visit with me. I enjoyed our conversation and look forward to speaking with you again next year. I, as well as your care team,  appreciate your ongoing commitment to your health goals. Please review the following plan we discussed and let me know if I can assist you in the future. Your Game plan/ To Do List    Referrals: If you haven't heard from the office you've been referred to, please reach out to them at the phone provided.   Follow up Visits: We will see or speak with you next year for your Next Medicare AWV with our clinical staff Have you seen your provider in the last 6 months (3 months if uncontrolled diabetes)? Yes  Clinician Recommendations:  Aim for 30 minutes of exercise or brisk walking, 6-8 glasses of water , and 5 servings of fruits and vegetables each day.       This is a list of the screenings recommended for you:  Health Maintenance  Topic Date Due   COVID-19 Vaccine (1) Never done   Zoster (Shingles) Vaccine (1 of 2) Never done   Pneumococcal Vaccine for age over 65 (3 of 3 - PCV20 or PCV21) 04/08/2022   DTaP/Tdap/Td vaccine (2 - Td or Tdap) 06/19/2022   DEXA scan (bone density measurement)  Never done   Flu Shot  01/24/2024   Medicare Annual Wellness Visit  01/29/2025   Mammogram  02/07/2025   Cologuard (Stool DNA test)  09/27/2026   Hepatitis C Screening  Completed   Hepatitis B Vaccine  Aged Out   HPV Vaccine  Aged Out   Meningitis B Vaccine  Aged Out    Advanced directives: (Declined) Advance directive discussed with you today. Even though you declined this today, please call our office should you change your mind, and we can give you the proper paperwork for you to fill out. Advance Care Planning is important because it:  [x]  Makes sure you receive the medical care that is consistent with your values, goals, and preferences  [x]  It provides guidance to your  family and loved ones and reduces their decisional burden about whether or not they are making the right decisions based on your wishes.  Follow the link provided in your after visit summary or read over the paperwork we have mailed to you to help you started getting your Advance Directives in place. If you need assistance in completing these, please reach out to us  so that we can help you!  See attachments for Preventive Care and Fall Prevention Tips.

## 2024-01-30 NOTE — Progress Notes (Signed)
 Because this visit was a virtual/telehealth visit,  certain criteria was not obtained, such a blood pressure, CBG if applicable, and timed get up and go. Any medications not marked as taking were not mentioned during the medication reconciliation part of the visit. Any vitals not documented were not able to be obtained due to this being a telehealth visit or patient was unable to self-report a recent blood pressure reading due to a lack of equipment at home via telehealth. Vitals that have been documented are verbally provided by the patient.   Subjective:   [name] is a 68 y.o. who presents for a Medicare Wellness preventive visit.  As a reminder, Annual Wellness Visits don't include a physical exam, and some assessments may be limited, especially if this visit is performed virtually. We may recommend an in-person follow-up visit with your provider if needed.  Visit Complete: Virtual I connected with  [name] on 01/30/24 by a audio enabled telemedicine application and verified that I am speaking with the correct person using two identifiers.  Patient Location: Home  Provider Location: Home Office  I discussed the limitations of evaluation and management by telemedicine. The patient expressed understanding and agreed to proceed.  Vital Signs: Because this visit was a virtual/telehealth visit, some criteria may be missing or patient reported. Any vitals not documented were not able to be obtained and vitals that have been documented are patient reported.  VideoDeclined- This patient declined Librarian, academic. Therefore the visit was completed with audio only.  Persons Participating in Visit: Patient.  AWV Questionnaire: Yes: Patient Medicare AWV questionnaire was completed prior to this visit on 8/6/20025..  Cardiac Risk Factors include: advanced age (>67men, >56 women);dyslipidemia;hypertension;sedentary lifestyle;smoking/ tobacco exposure;family  history of premature cardiovascular disease     Objective:    Today's Vitals   01/30/24 0953  BP: 117/72  Pulse: (!) 58  Weight: 149 lb (67.6 kg)  Height: 5' (1.524 m)  PainSc: 0-No pain   Body mass index is 29.1 kg/m.     01/30/2024    9:54 AM 09/23/2023    8:43 AM 11/05/2022   11:09 AM 03/26/2022    1:06 PM 11/16/2021    2:28 PM 05/23/2014    2:35 PM 05/22/2014    9:05 PM  Advanced Directives  Does Patient Have a Medical Advance Directive? No No Yes No No No  No   Type of Advance Directive   Living will;Healthcare Power of Attorney      Does patient want to make changes to medical advance directive?   No - Patient declined      Copy of Healthcare Power of Attorney in Chart?   No - copy requested      Would patient like information on creating a medical advance directive? No - Patient declined No - Patient declined  No - Patient declined No - Patient declined  No - patient declined information      Data saved with a previous flowsheet row definition    Current Medications (verified) Outpatient Encounter Medications as of 01/30/2024  Medication Sig   amLODipine  (NORVASC ) 5 MG tablet TAKE 1 TABLET BY MOUTH DAILY   clopidogrel  (PLAVIX ) 75 MG tablet TAKE 1 TABLET BY MOUTH DAILY   Dexlansoprazole  30 MG capsule DR TAKE 1 CAPSULE(30 MG) BY MOUTH DAILY   diphenhydramine -acetaminophen  (TYLENOL  PM) 25-500 MG TABS Take 1 tablet by mouth at bedtime as needed (pain, sleep).   escitalopram  (LEXAPRO ) 20 MG tablet Take 1 tablet (  20 mg total) daily.   fenofibrate  micronized (LOFIBRA) 67 MG capsule TAKE 1 CAPSULE BY MOUTH DAILY  BEFORE BREAKFAST   levothyroxine  (SYNTHROID ) 88 MCG tablet Take 1 tablet (88 mcg total) by mouth daily before breakfast.   metoprolol  succinate (TOPROL -XL) 50 MG 24 hr tablet TAKE 1 TABLET BY MOUTH DAILY  WITH OR IMMEDIATELY FOLLOWING A  MEAL   nitroGLYCERIN  (NITROSTAT ) 0.4 MG SL tablet Place 1 tablet (0.4 mg total) under the tongue every 5 (five) minutes as needed for  chest pain.   rosuvastatin  (CRESTOR ) 40 MG tablet TAKE 1 TABLET BY MOUTH DAILY   tiZANidine  (ZANAFLEX ) 4 MG tablet Take 1 tablet (4 mg total) by mouth every 8 (eight) hours as needed for muscle spasms.   traZODone  (DESYREL ) 100 MG tablet Take 0.5 tablets (50 mg total) by mouth at bedtime.   [DISCONTINUED] lidocaine  (LIDODERM ) 5 % Place 1 patch onto the skin daily. Remove & Discard patch within 12 hours or as directed by MD   No facility-administered encounter medications on file as of 01/30/2024.    Allergies (verified) Ranitidine, Cimetidine, Sulfamethoxazole-trimethoprim, and Sulfa antibiotics   History: Past Medical History:  Diagnosis Date   Anemia    Anxiety    Blood transfusion    Chest pain, cardiac    Has chronic chest pain.    Complication of anesthesia    my blood pressure will drop out on me   Coronary artery disease    a) s/p CABG x 2 in [place]  2002. b)stenting to the SVG-OM in 2009. c)DES to the saphenous vein graft to the ramus in March 2012. d) s/p PTCA/DES to native ramus intermedius in September 2012. 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   Enterocolitis    Gastroparesis    GERD (gastroesophageal reflux disease)    GI bleeding    Hematuria 09/25/11   lately   Hx: UTI (urinary tract infection)    multiple   Hyperlipidemia    Hypertension    Hypothyroidism    Migraines    h/o   PTSD (post-traumatic stress disorder)    Tobacco abuse    Past Surgical History:  Procedure Laterality Date   ABDOMINAL HYSTERECTOMY  02/24/1979   BIOPSY STOMACH  09/25/2011   several in the last year; all negative; via endoscopy   BREAST EXCISIONAL BIOPSY Right    BREAST SURGERY     right biopsy   CHOLECYSTECTOMY  02/24/1999   CORONARY ANGIOPLASTY WITH STENT PLACEMENT      lots; they've all collapsed; last time dr took native vein &  turned it around & put stents in it before attaching   CORONARY ARTERY BYPASS GRAFT  06/25/2000   CABG X2   CYSTO WITH HYDRODISTENSION  03/28/2012   Procedure: CYSTOSCOPY/HYDRODISTENSION;  Surgeon: Glendia DELENA Elizabeth, MD;  Location: [facility];  Service: Urology;  Laterality: N/A;   CYSTOSCOPY W/ RETROGRADES  03/28/2012   Procedure: CYSTOSCOPY WITH RETROGRADE PYELOGRAM;  Surgeon: Glendia DELENA Elizabeth, MD;  Location: [facility];  Service: Urology;  Laterality: Bilateral;   CYSTOSCOPY WITH INJECTION  03/28/2012   Procedure: CYSTOSCOPY WITH INJECTION;  Surgeon: Glendia DELENA Elizabeth, MD;  Location: [facility];  Service: Urology;  Laterality: N/A;  Marcaine  and Pyridium    PILONIDAL CYST / SINUS EXCISION  02/24/1979  was wrapped around my spinal cord; Dr. Ubaldo Shine got almost all of it except a tiny bit; said it was really deep; packed fatty tissue in the hole; really bothers me alot   SALPINGOOPHORECTOMY  02/24/1979   2 surgeries; 1st right; then left   TMJ ARTHROPLASTY  02/24/1979   right side; I've had to have it twice   VESICOVAGINAL FISTULA CLOSURE W/ TAH  06/25/1981   Family History  Problem Relation Age of Onset   Emphysema Mother        smoked   Asthma Mother    Breast cancer Mother    Heart disease Father    Heart disease Paternal Grandfather    Social History   Socioeconomic History   Marital status: Widowed    Spouse name: Not on file   Number of children: 2   Years of education: Not on file   Highest education level: GED or equivalent  Occupational History   Occupation: Disabled  Tobacco Use   Smoking status: Every Day    Current packs/day: 1.00    Average packs/day: 1 pack/day for 15.0 years (15.0 ttl pk-yrs)    Types: Cigarettes   Smokeless tobacco: Never  Vaping Use   Vaping status: Never Used  Substance and Sexual Activity   Alcohol use: No   Drug use: Not Currently    Types: Marijuana    Comment: as a  teen   Sexual activity: Not Currently  Other Topics Concern   Not on file  Social History Narrative   Not on file   Social Drivers of Health   Financial Resource Strain: Medium Risk (01/30/2024)   Overall Financial Resource Strain (CARDIA)    Difficulty of Paying Living Expenses: Somewhat hard  Food Insecurity: No Food Insecurity (01/30/2024)   Hunger Vital Sign    Worried About Running Out of Food in the Last Year: Never true    Ran Out of Food in the Last Year: Never true  Transportation Needs: No Transportation Needs (01/30/2024)   PRAPARE - Administrator, Civil Service (Medical): No    Lack of Transportation (Non-Medical): No  Physical Activity: Inactive (01/30/2024)   Exercise Vital Sign    Days of Exercise per Week: 0 days    Minutes of Exercise per Session: 30 min  Stress: Stress Concern Present (01/30/2024)   [employer] of Occupational Health - Occupational Stress Questionnaire    Feeling of Stress: To some extent  Social Connections: Socially Isolated (01/30/2024)   Social Connection and Isolation Panel    Frequency of Communication with Friends and Family: Twice a week    Frequency of Social Gatherings with Friends and Family: Never    Attends Religious Services: Never    Database administrator or Organizations: No    Attends Banker Meetings: Never    Marital Status: Widowed    Tobacco Counseling Ready to quit: Not Answered Counseling given: Not Answered    Clinical Intake:  Pre-visit preparation completed: Yes  Pain : No/denies pain Pain Score: 0-No pain     BMI - recorded: 29.1 Nutritional Status: BMI 25 -29 Overweight Nutritional Risks: None Diabetes: No  Lab Results  Component Value Date   HGBA1C 5.8 11/05/2016   HGBA1C 5.7 (H) 03/08/2011   HGBA1C  09/06/2010    5.6 (NOTE)  According to the ADA Clinical Practice Recommendations for 2011, when HbA1c is  used as a screening test:   >=6.5%   Diagnostic of Diabetes Mellitus           (if abnormal result  is confirmed)  5.7-6.4%   Increased risk of developing Diabetes Mellitus  References:Diagnosis and Classification of Diabetes Mellitus,Diabetes Care,2011,34(Suppl 1):S62-S69 and Standards of Medical Care in         Diabetes - 2011,Diabetes Care,2011,34  (Suppl 1):S11-S61.     How often do you need to have someone help you when you read instructions, pamphlets, or other written materials from your doctor or pharmacy?: 1 - Never  Interpreter Needed?: No  Information entered by :: Jeriah Corkum N. Geraldin Habermehl, LPN.   Activities of Daily Living     01/30/2024   10:03 AM 01/29/2024   10:47 AM  In your present state of health, do you have any difficulty performing the following activities:  Hearing? 0 0  Vision? 0 0  Difficulty concentrating or making decisions? 1 1  Walking or climbing stairs? 0 0  Dressing or bathing? 0 0  Doing errands, shopping? 0 0  Preparing Food and eating ? N N  Using the Toilet? N N  In the past six months, have you accidently leaked urine? Y Y  Do you have problems with loss of bowel control? Y Y  Managing your Medications? N N  Managing your Finances? N N  Housekeeping or managing your Housekeeping? N N    Patient Care Team: Lorrane Pac, MD as PCP - General (Family Medicine) [place], Peter M, MD as Referring Physician ([facility] [employer] [employer] as Consulting Physician (Optometry)  I have updated your Care Teams any recent Medical Services you may have received from other providers in the past year.     Assessment:   This is a routine wellness examination for Emily.  Hearing/Vision screen Hearing Screening - Comments:: Denies hearing difficulties.  Vision Screening - Comments:: Wears rx glasses - up to date with routine eye exams with [facility] [place])    Goals Addressed             This Visit's Progress    01/30/2024: To lose 30  pounds.         Depression Screen     01/30/2024    9:55 AM 09/23/2023    8:43 AM 11/05/2022   11:09 AM 03/26/2022    1:06 PM 01/09/2022    8:48 AM 02/04/2020    3:11 PM 03/27/2018    2:41 PM  PHQ 2/9 Scores  PHQ - 2 Score 2 6 5 5 1 3  0  PHQ- 9 Score 5 16 12 18 6 6      Fall Risk     01/30/2024   10:03 AM 01/29/2024   10:47 AM 09/23/2023    8:43 AM 11/05/2022   11:08 AM 03/27/2018    2:41 PM  Fall Risk   Falls in the past year? 0 0 0 0 No   Number falls in past yr: 0  0 0   Injury with Fall? 0  0 0   Risk for fall due to : No Fall Risks   No Fall Risks   Follow up Falls evaluation completed   Falls prevention discussed;Education provided;Falls evaluation completed      Data saved with a previous flowsheet row definition    MEDICARE RISK AT HOME:  Medicare Risk at Home Any stairs in or around the home?:  No If so, are there any without handrails?: No Home free of loose throw rugs in walkways, pet beds, electrical cords, etc?: Yes Adequate lighting in your home to reduce risk of falls?: Yes Life alert?: No Use of a cane, walker or w/c?: No Grab bars in the bathroom?: No Shower chair or bench in shower?: Yes Elevated toilet seat or a handicapped toilet?: No  TIMED UP AND GO:  Was the test performed?  No  Cognitive Function: Declined/Normal: No cognitive concerns noted by patient or family. Patient alert, oriented, able to answer questions appropriately and recall recent events. No signs of memory loss or confusion.    01/30/2024    9:55 AM  MMSE - Mini Mental State Exam  Not completed: Unable to complete        01/30/2024    9:58 AM 11/05/2022   11:09 AM  6CIT Screen  What Year? 0 points 0 points  What month? 0 points 0 points  What time? 0 points 0 points  Count back from 20 0 points 0 points  Months in reverse 0 points 0 points  Repeat phrase 0 points 0 points  Total Score 0 points 0 points    Immunizations Immunization History  Administered Date(s) Administered    Influenza, Quadrivalent, Recombinant, Inj, Pf 04/13/2019   Influenza, Seasonal, Injecte, Preservative Fre 06/19/2012   Influenza,inj,Quad PF,6+ Mos 03/26/2014, 04/11/2016, 04/08/2017, 03/27/2018   Pneumococcal Conjugate-13 04/27/2015   Pneumococcal Polysaccharide-23 04/08/2017   Tdap 06/19/2012    Screening Tests Health Maintenance  Topic Date Due   COVID-19 Vaccine (1) Never done   Zoster Vaccines- Shingrix (1 of 2) Never done   Pneumococcal Vaccine: 50+ Years (3 of 3 - PCV20 or PCV21) 04/08/2022   DTaP/Tdap/Td (2 - Td or Tdap) 06/19/2022   DEXA SCAN  Never done   INFLUENZA VACCINE  01/24/2024   Medicare Annual Wellness (AWV)  01/29/2025   MAMMOGRAM  02/07/2025   Fecal DNA (Cologuard)  09/27/2026   Hepatitis C Screening  Completed   Hepatitis B Vaccines  Aged Out   HPV VACCINES  Aged Out   Meningococcal B Vaccine  Aged Out    Health Maintenance  Health Maintenance Due  Topic Date Due   COVID-19 Vaccine (1) Never done   Zoster Vaccines- Shingrix (1 of 2) Never done   Pneumococcal Vaccine: 50+ Years (3 of 3 - PCV20 or PCV21) 04/08/2022   DTaP/Tdap/Td (2 - Td or Tdap) 06/19/2022   DEXA SCAN  Never done   INFLUENZA VACCINE  01/24/2024   Health Maintenance Items Addressed: Yes Patient aware of current care gaps.  Immunization record was verified by [employer].  Patient declined all vaccines.  Additional Screening:  Vision Screening: Recommended annual ophthalmology exams for early detection of glaucoma and other disorders of the eye. Would you like a referral to an eye doctor? No    Dental Screening: Recommended annual dental exams for proper oral hygiene  Community Resource Referral / Chronic Care Management: CRR required this visit?  No   CCM required this visit?  No   Plan:    I have personally reviewed and noted the following in the patient's chart:   Medical and social history Use of alcohol, tobacco or illicit drugs  Current medications and supplements  including opioid prescriptions. Patient is not currently taking opioid prescriptions. Functional ability and status Nutritional status Physical activity Advanced directives List of other physicians Hospitalizations, surgeries, and ER visits in previous 12 months Vitals Screenings to include cognitive,  depression, and falls Referrals and appointments  In addition, I have reviewed and discussed with patient certain preventive protocols, quality metrics, and best practice recommendations. A written personalized care plan for preventive services as well as general preventive health recommendations were provided to patient.   Roz LOISE Fuller, LPN   06/26/7972   After Visit Summary: (MyChart) Due to this being a telephonic visit, the after visit summary with patients personalized plan was offered to patient via MyChart   Notes: Nothing significant to report at this time.

## 2024-02-05 ENCOUNTER — Ambulatory Visit (HOSPITAL_BASED_OUTPATIENT_CLINIC_OR_DEPARTMENT_OTHER)
Admission: RE | Admit: 2024-02-05 | Discharge: 2024-02-05 | Disposition: A | Discharge status: Home / Self Care | Source: Ambulatory Visit | Attending: Family Medicine | Admitting: Family Medicine

## 2024-02-05 DIAGNOSIS — M8589 Other specified disorders of bone density and structure, multiple sites: Secondary | ICD-10-CM | POA: Diagnosis not present

## 2024-02-05 DIAGNOSIS — Z13820 Encounter for screening for osteoporosis: Secondary | ICD-10-CM | POA: Insufficient documentation

## 2024-02-07 ENCOUNTER — Ambulatory Visit: Payer: Self-pay | Admitting: Family Medicine

## 2024-02-07 ENCOUNTER — Ambulatory Visit (HOSPITAL_COMMUNITY)

## 2024-02-07 ENCOUNTER — Encounter (HOSPITAL_COMMUNITY): Payer: Self-pay

## 2024-02-07 ENCOUNTER — Ambulatory Visit (HOSPITAL_COMMUNITY)
Admission: EM | Admit: 2024-02-07 | Discharge: 2024-02-07 | Disposition: A | Discharge status: Home / Self Care | Attending: Emergency Medicine | Admitting: Emergency Medicine

## 2024-02-07 DIAGNOSIS — L723 Sebaceous cyst: Secondary | ICD-10-CM | POA: Diagnosis not present

## 2024-02-07 MED ORDER — DOXYCYCLINE HYCLATE 100 MG PO CAPS: 100.0000 mg | ORAL_CAPSULE | Freq: Two times a day (BID) | ORAL | 0 refills | Status: DC

## 2024-02-07 MED ORDER — TRAMADOL HCL 50 MG PO TABS: 50.0000 mg | ORAL_TABLET | Freq: Two times a day (BID) | ORAL | 0 refills | Status: DC | PRN

## 2024-02-07 NOTE — Discharge Instructions (Signed)
 Starting doxycycline  twice daily for 10 days for infection coverage related to this cyst. I have prescribed a few doses of tramadol  that you can take as needed for severe pain. Otherwise you can take 500 mg of Tylenol  every 6-8 hours as needed for breakthrough pain.  Do not exceed 4000 mg in 1 day. I have provided you with an ambulatory referral to dermatology.  If you do not hear from them within the week call to schedule an appointment with them. Otherwise you can follow-up with your primary care provider or return here as needed.

## 2024-02-07 NOTE — ED Provider Notes (Signed)
 [facility]    CSN: [id number] Arrival date & time: 02/07/24  1248      History   Chief Complaint Chief Complaint  Patient presents with   Appointment   Cyst    HPI [name] is a 68 y.o. female.   Patient presents with cyst to the back of her neck that she has had for years, but states that it has increased in size over the last 2 to 3 months.  Patient states that at times there will be a whitehead to the top of the cyst which she will pop with no relief.   Patient states that she has been taking Tylenol  PM with little relief.  Patient states that the pain is so severe at times that she has difficulty sleeping and it feels like it is radiating up to her head patient states that she has not yet seen a dermatologist for this.    Patient states that she was told in the past that she would likely need surgery to remove this if it becomes bothersome.  Patient is requesting referral to dermatology.  Patient denies any fever, body aches, chills, and weakness  The history is provided by the patient and medical records.    Past Medical History:  Diagnosis Date   Anemia    Anxiety    Blood transfusion    Chest pain, cardiac    Has chronic chest pain.    Complication of anesthesia    my blood pressure will drop out on me   Coronary artery disease    a) s/p CABG x 2 in [place]  2002. b)stenting to the SVG-OM in 2009. c)DES to the saphenous vein graft to the ramus in March 2012. d) s/p PTCA/DES to native ramus intermedius in September 2012. 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   Enterocolitis    Gastroparesis    GERD (gastroesophageal reflux disease)    GI bleeding    Hematuria 09/25/11   lately   Hx: UTI (urinary tract infection)    multiple   Hyperlipidemia     Hypertension    Hypothyroidism    Migraines    h/o   PTSD (post-traumatic stress disorder)    Tobacco abuse     Patient Active Problem List   Diagnosis Date Noted   COPD (chronic obstructive pulmonary disease) (HCC) 10/21/2023   Lipoma of neck 01/09/2022   Left knee pain 11/16/2021   History of anemia 11/06/2016   IBS (irritable bowel syndrome) 11/06/2016   Acute cough 04/18/2014   Headache 01/04/2014   GERD (gastroesophageal reflux disease) 08/18/2013   Anxiety and depression 11/01/2010   Coronary artery disease    Hypertension    Hyperlipidemia    Hypothyroidism     Past Surgical History:  Procedure Laterality Date   ABDOMINAL HYSTERECTOMY  02/24/1979   BIOPSY STOMACH  09/25/2011   several in the last year; all negative; via endoscopy   BREAST EXCISIONAL BIOPSY Right    BREAST SURGERY     right biopsy   CHOLECYSTECTOMY  02/24/1999   CORONARY ANGIOPLASTY WITH STENT PLACEMENT     lots; they've all collapsed; last time dr took native vein &  turned it around & put stents in it before attaching   CORONARY ARTERY BYPASS GRAFT  06/25/2000   CABG X2   CYSTO WITH HYDRODISTENSION  03/28/2012   Procedure: CYSTOSCOPY/HYDRODISTENSION;  Surgeon: Glendia DELENA Elizabeth, MD;  Location: [facility];  Service: Urology;  Laterality: N/A;   CYSTOSCOPY W/ RETROGRADES  03/28/2012   Procedure: CYSTOSCOPY WITH RETROGRADE PYELOGRAM;  Surgeon: Glendia DELENA Elizabeth, MD;  Location: [facility];  Service: Urology;  Laterality: Bilateral;   CYSTOSCOPY WITH INJECTION  03/28/2012   Procedure: CYSTOSCOPY WITH INJECTION;  Surgeon: Glendia DELENA Elizabeth, MD;  Location: [facility];  Service: Urology;  Laterality: N/A;  Marcaine  and Pyridium    PILONIDAL CYST / SINUS EXCISION  02/24/1979   was wrapped around my spinal cord; Dr. Ubaldo Shine got almost all of it except a tiny bit; said it was really deep; packed fatty tissue in the hole; really bothers me alot   SALPINGOOPHORECTOMY  02/24/1979   2 surgeries; 1st right;  then left   TMJ ARTHROPLASTY  02/24/1979   right side; I've had to have it twice   VESICOVAGINAL FISTULA CLOSURE W/ TAH  06/25/1981    OB History   No obstetric history on file.      Home Medications    Prior to Admission medications   Medication Sig Start Date End Date Taking? Authorizing Provider  amLODipine  (NORVASC ) 5 MG tablet TAKE 1 TABLET BY MOUTH DAILY 11/25/23  Yes Sw[place], Peter M, MD  clopidogrel  (PLAVIX ) 75 MG tablet TAKE 1 TABLET BY MOUTH DAILY 03/13/23  Yes [place], Peter M, MD  Dexlansoprazole  30 MG capsule DR TAKE 1 CAPSULE(30 MG) BY MOUTH DAILY 07/25/23  Yes Bryan Bianchi, MD  diphenhydramine -acetaminophen  (TYLENOL  PM) 25-500 MG TABS Take 1 tablet by mouth at bedtime as needed (pain, sleep).   Yes [provider]  doxycycline  (VIBRAMYCIN ) 100 MG capsule Take 1 capsule (100 mg total) by mouth 2 (two) times daily. 02/07/24  Yes Johnie Flaming A, NP  fenofibrate  micronized (LOFIBRA) 67 MG capsule TAKE 1 CAPSULE BY MOUTH DAILY  BEFORE BREAKFAST 11/25/23  Yes Bryan Bianchi, MD  levothyroxine  (SYNTHROID ) 88 MCG tablet Take 1 tablet (88 mcg total) by mouth daily before breakfast. 01/24/24  Yes Lorrane Pac, MD  metoprolol  succinate (TOPROL -XL) 50 MG 24 hr tablet TAKE 1 TABLET BY MOUTH DAILY  WITH OR IMMEDIATELY FOLLOWING A  MEAL 03/20/23  Yes [place], Peter M, MD  rosuvastatin  (CRESTOR ) 40 MG tablet TAKE 1 TABLET BY MOUTH DAILY 03/20/23  Yes [place], Peter M, MD  traMADol  (ULTRAM ) 50 MG tablet Take 1 tablet (50 mg total) by mouth every 12 (twelve) hours as needed for severe pain (pain score 7-10). 02/07/24  Yes Johnie, Delila Kuklinski A, NP  escitalopram  (LEXAPRO ) 20 MG tablet Take 1 tablet (20 mg total) daily. 12/09/23   Ezzard Staci SAILOR, NP  nitroGLYCERIN  (NITROSTAT ) 0.4 MG SL tablet Place 1 tablet (0.4 mg total) under the tongue every 5 (five) minutes as needed for chest pain. 05/29/21 10/21/23  [place], Peter M, MD  tiZANidine  (ZANAFLEX ) 4 MG tablet Take 1 tablet (4 mg total) by  mouth every 8 (eight) hours as needed for muscle spasms. 09/23/23   Bryan Bianchi, MD    Family History Family History  Problem Relation Age of Onset   Emphysema Mother        smoked   Asthma Mother    Breast cancer Mother    Heart disease Father    Heart disease Paternal Grandfather     Social History Social History   Tobacco Use   Smoking status: Every Day    Current  packs/day: 1.00    Average packs/day: 1 pack/day for 15.0 years (15.0 ttl pk-yrs)    Types: Cigarettes   Smokeless tobacco: Never  Vaping Use   Vaping status: Never Used  Substance Use Topics   Alcohol use: No   Drug use: Not Currently    Types: Marijuana    Comment: as a teen     Allergies   Ranitidine, Cimetidine, Sulfamethoxazole-trimethoprim, and Sulfa antibiotics   Review of Systems Review of Systems  Per HPI  Physical Exam Triage Vital Signs ED Triage Vitals  Encounter Vitals Group     BP 02/07/24 1324 132/76     Girls Systolic BP Percentile --      Girls Diastolic BP Percentile --      Boys Systolic BP Percentile --      Boys Diastolic BP Percentile --      Pulse Rate 02/07/24 1324 (!) 109     Resp 02/07/24 1324 18     Temp 02/07/24 1324 99.2 F (37.3 C)     Temp Source 02/07/24 1324 Oral     SpO2 02/07/24 1324 96 %     Weight 02/07/24 1323 149 lb (67.6 kg)     Height 02/07/24 1323 5' (1.524 m)     Head Circumference --      Peak Flow --      Pain Score 02/07/24 1322 7     Pain Loc --      Pain Education --      Exclude from Growth Chart --    No data found.  Updated Vital Signs BP 132/76 (BP Location: Right Arm)   Pulse (!) 109   Temp 99.2 F (37.3 C) (Oral)   Resp 18   Ht 5' (1.524 m)   Wt 149 lb (67.6 kg)   SpO2 96%   BMI 29.10 kg/m   Visual Acuity Right Eye Distance:   Left Eye Distance:   Bilateral Distance:    Right Eye Near:   Left Eye Near:    Bilateral Near:     Physical Exam Vitals and nursing note reviewed.  Constitutional:      General: She  is awake. She is not in acute distress.    Appearance: Normal appearance. She is well-developed and well-groomed. She is not ill-appearing.  Skin:    General: Skin is warm and dry.     Findings: Erythema present.     Comments: Approximately 4 to 5 cm in diameter mobile cyst noted to base of left neck. Central erythema with a scabbed over lesion as well.  No drainage present at this time.  Neurological:     Mental Status: She is alert.  Psychiatric:        Behavior: Behavior is cooperative.      UC Treatments / Results  Labs (all labs ordered are listed, but only abnormal results are displayed) Labs Reviewed - No data to display  EKG   Radiology No results found.  Procedures Procedures (including critical care time)  Medications Ordered in UC Medications - No data to display  Initial Impression / Assessment and Plan / UC Course  I have reviewed the triage vital signs and the nursing notes.  Pertinent labs & imaging results that were available during my care of the patient were reviewed by me and considered in my medical decision making (see chart for details).     Patient is overall well-appearing.  Vitals are stable.  Exam findings consistent with sebaceous cyst.  Erythema is suspicious for infection related to this.  Prescribed doxycycline  for infection coverage.  PDMP reviewed.  Prescribed a few doses of tramadol  for pain.  Recommended Tylenol  as needed for pain.  Given ambulatory referral to dermatology.  Discussed follow-up and return precautions Final Clinical Impressions(s) / UC Diagnoses   Final diagnoses:  Sebaceous cyst     Discharge Instructions      Starting doxycycline  twice daily for 10 days for infection coverage related to this cyst. I have prescribed a few doses of tramadol  that you can take as needed for severe pain. Otherwise you can take 500 mg of Tylenol  every 6-8 hours as needed for breakthrough pain.  Do not exceed 4000 mg in 1 day. I have  provided you with an ambulatory referral to dermatology.  If you do not hear from them within the week call to schedule an appointment with them. Otherwise you can follow-up with your primary care provider or return here as needed.   ED Prescriptions     Medication Sig Dispense Auth. Provider   traMADol  (ULTRAM ) 50 MG tablet Take 1 tablet (50 mg total) by mouth every 12 (twelve) hours as needed for severe pain (pain score 7-10). 4 tablet Johnie Flaming A, NP   doxycycline  (VIBRAMYCIN ) 100 MG capsule Take 1 capsule (100 mg total) by mouth 2 (two) times daily. 20 capsule Johnie Flaming A, NP      I have reviewed the PDMP during this encounter.   Johnie Flaming A, NP 02/07/24 [phone]

## 2024-02-07 NOTE — ED Triage Notes (Signed)
 Patient presenting with headache and a large cyst on the back of the neck onset for over a year but worse in the last 2-3 months. States at time the mass will have a white head that she will attempt to pop with no relief.  Prescriptions or OTC medications tried: Yes- tylenol  PM   with little relief to sleep.

## 2024-02-07 NOTE — Progress Notes (Signed)
 FRAX = 4 points = 0.9% 5 year risk hip fracture.

## 2024-02-13 ENCOUNTER — Ambulatory Visit

## 2024-02-13 VITALS — BP 118/66 | HR 87 | Temp 98.8°F | Ht 60.0 in | Wt 151.2 lb

## 2024-02-13 DIAGNOSIS — L729 Follicular cyst of the skin and subcutaneous tissue, unspecified: Secondary | ICD-10-CM | POA: Diagnosis not present

## 2024-02-13 DIAGNOSIS — Z122 Encounter for screening for malignant neoplasm of respiratory organs: Secondary | ICD-10-CM | POA: Diagnosis not present

## 2024-02-13 NOTE — Patient Instructions (Signed)
 It was wonderful to see you today.  Please bring ALL of your medications with you to every visit.   Today we talked about:  Cyst on your neck - I have placed the referral for the surgeon. Please look out for a phone call in the next two weeks. Continue the antibiotics you were given.   For the cyst on your back, avoid trying to pop it. I have placed a dermatology referral to be seen regarding these multiple cysts.   Please make a follow up appointment with your PCP to discuss headaches and other neck pain.   Thank you for choosing [facility].   Please call [phone] with any questions about today's appointment.  Please be sure to schedule follow up at the front desk before you leave today.   Areta Saliva, MD  Family Medicine

## 2024-02-13 NOTE — Progress Notes (Unsigned)
    SUBJECTIVE:   CHIEF COMPLAINT / HPI:   Neck Cyst Has had the cyst since 2005. She has popped it a couple times. Denies fevers.     PERTINENT  PMH / PSH: ***  OBJECTIVE:   BP 118/66   Pulse 87   Ht 5' (1.524 m)   Wt 151 lb 3.2 oz (68.6 kg)   SpO2 99%   BMI 29.53 kg/m   ***  ASSESSMENT/PLAN:   Assessment & Plan Cyst of skin      Christina Saliva, MD [facility]

## 2024-02-14 ENCOUNTER — Encounter: Payer: Self-pay | Admitting: Family Medicine

## 2024-02-21 ENCOUNTER — Ambulatory Visit (HOSPITAL_COMMUNITY)
Admission: RE | Admit: 2024-02-21 | Discharge: 2024-02-21 | Disposition: A | Discharge status: Home / Self Care | Source: Ambulatory Visit | Attending: Family Medicine | Admitting: Family Medicine

## 2024-02-21 DIAGNOSIS — F17210 Nicotine dependence, cigarettes, uncomplicated: Secondary | ICD-10-CM | POA: Diagnosis not present

## 2024-02-21 DIAGNOSIS — R918 Other nonspecific abnormal finding of lung field: Secondary | ICD-10-CM | POA: Insufficient documentation

## 2024-02-21 DIAGNOSIS — I700 Atherosclerosis of aorta: Secondary | ICD-10-CM | POA: Insufficient documentation

## 2024-02-21 DIAGNOSIS — I2510 Atherosclerotic heart disease of native coronary artery without angina pectoris: Secondary | ICD-10-CM | POA: Insufficient documentation

## 2024-02-21 DIAGNOSIS — Z122 Encounter for screening for malignant neoplasm of respiratory organs: Secondary | ICD-10-CM | POA: Diagnosis present

## 2024-02-21 DIAGNOSIS — J439 Emphysema, unspecified: Secondary | ICD-10-CM | POA: Insufficient documentation

## 2024-02-24 ENCOUNTER — Other Ambulatory Visit: Payer: Self-pay | Admitting: Cardiology

## 2024-02-25 ENCOUNTER — Ambulatory Visit: Payer: Self-pay | Admitting: Surgery

## 2024-02-26 ENCOUNTER — Telehealth: Payer: Self-pay

## 2024-02-26 NOTE — Telephone Encounter (Signed)
   Pre-operative Risk Assessment    Patient Name: [name]  DOB: [birth year] MRN: [id number]   Date of last office visit: 03/04/23 PETER SWAZILAND, MD Date of next office visit: NONE   Request for Surgical Clearance    Procedure:  CYST EXCISION  Date of Surgery:  Clearance TBD                                Surgeon:  KRYSTAL SPINNER, MD Surgeon's Group or Practice Name:  [facility] Phone number:  [phone] Fax number:  [phone]   ATTN: BURNARD CRETE, LPN   Type of Clearance Requested:   - Medical  - Pharmacy:  Hold Clopidogrel  (Plavix )     Type of Anesthesia:  General    Additional requests/questions:    Signed, Lucie DELENA Ku   02/26/2024, 11:02 AM

## 2024-02-26 NOTE — Telephone Encounter (Signed)
Left message to call back to schedule in office appt.

## 2024-02-26 NOTE — Telephone Encounter (Signed)
   Name: [name]  DOB: 10-10-1955  MRN: [id number]  Primary Cardiologist: None  Chart reviewed as part of pre-operative protocol coverage. Because of [name]'s past medical history and time since last visit, she will require a follow-up in-office visit in order to better assess preoperative cardiovascular risk.  Pre-op covering staff: - Please schedule appointment and call patient to inform them. If patient already had an upcoming appointment within acceptable timeframe, please add pre-op clearance to the appointment notes so provider is aware. - Please contact requesting surgeon's office via preferred method (i.e, phone, fax) to inform them of need for appointment prior to surgery.   Rollo FABIENE Louder, PA-C  02/26/2024, 11:14 AM

## 2024-02-26 NOTE — Telephone Encounter (Signed)
 Patient is returning call.

## 2024-02-26 NOTE — Telephone Encounter (Signed)
 Pt has been scheduled in office appt 03/02/24 Lum Louis, NP for preop clearance.   I will update all parties involved.

## 2024-02-27 ENCOUNTER — Encounter: Payer: Self-pay | Admitting: *Deleted

## 2024-02-27 ENCOUNTER — Telehealth: Payer: Self-pay

## 2024-02-27 NOTE — Telephone Encounter (Signed)
 Patient LVM on nurse line requesting CT Chest results.   Called patient. I advised the results are not back yet from Radiology.   Patient was appreciative of call.

## 2024-03-02 ENCOUNTER — Ambulatory Visit: Attending: Internal Medicine | Admitting: Emergency Medicine

## 2024-03-02 ENCOUNTER — Encounter: Payer: Self-pay | Admitting: Emergency Medicine

## 2024-03-02 VITALS — BP 116/70 | HR 92 | Ht 60.0 in | Wt 153.0 lb

## 2024-03-02 DIAGNOSIS — I25708 Atherosclerosis of coronary artery bypass graft(s), unspecified, with other forms of angina pectoris: Secondary | ICD-10-CM

## 2024-03-02 DIAGNOSIS — I10 Essential (primary) hypertension: Secondary | ICD-10-CM | POA: Diagnosis not present

## 2024-03-02 DIAGNOSIS — Z01810 Encounter for preprocedural cardiovascular examination: Secondary | ICD-10-CM

## 2024-03-02 DIAGNOSIS — Z720 Tobacco use: Secondary | ICD-10-CM

## 2024-03-02 DIAGNOSIS — E785 Hyperlipidemia, unspecified: Secondary | ICD-10-CM

## 2024-03-02 DIAGNOSIS — J449 Chronic obstructive pulmonary disease, unspecified: Secondary | ICD-10-CM

## 2024-03-02 NOTE — Progress Notes (Signed)
 Cardiology Office Note:    Date:  03/02/2024  ID:  [name], DOB 1956-04-10, MRN [id number] PCP: Lorrane Pac, MD  [facility] Providers Cardiologist:  Peter Swaziland, MD Cardiology APP:  Rana Lum CROME, NP       Patient Profile:       Chief Complaint: 1 year follow-up and preoperative clearance History of Present Illness:  [name] is a 68 y.o. female with visit-pertinent history of coronary artery disease s/p CABG in 2002, tobacco use, COPD, GERD, hypertension, hypothyroidism  She has history of remote coronary bypass surgery in 2002.  She had prior stenting of the vein graft to obtuse marginal vessel in 2009.  She then had a drug-eluting stent placement to the same saphenous vein graft to the ramus in March 2012.  This subsequently occluded and she underwent angioplasty and drug-eluting stent to ramus intermedius branch in September 2012.  Cardiac catheterization again in October 2012 showed nonobstructive disease.  She underwent Lexiscan  Myoview  in July 2015 that showed no ischemia and normal EF.  She was last seen in office on 03/04/2023 by Dr. Swaziland.  She was doing fairly well until about a month ago when she experienced severe angina.  Her chest pain radiated to her jaw and she took 3 sublingual nitroglycerin  and became very dizzy.  Went to the ED but never got checked in and she left.  States pain lasted about 4 hours.  She was still smoking half a pack a day.  She underwent cardiac PET/CT that showed LV perfusion is normal with no evidence of ischemia or infarction.  Rest EF 69% and stress EF 68%.  The study was normal low risk.  She has nonpitting cyst excision with central [place] surgery on date TBD.   Discussed the use of AI scribe software for clinical note transcription with the patient, who gave verbal consent to proceed.  History of Present Illness [name] is a 68 year old female with coronary artery disease who presents for evaluation.  Today  patient is doing well overall.  She is without any acute cardiovascular concerns or complaints.  She is without any active chest pain.  She denies any dyspnea, orthopnea, PND, leg swelling, palpitations.  She experiences occasional angina, relieved by nitroglycerin .  Denies any acceleration of her angina and reports this has been stable over the past year.  A single episode of palpitations occurred about a month ago, described as 'butterflies in the chest', which resolved quickly with no further reoccurrence.  She is adherent to current medication regimen.  She takes amlodipine , Plavix , fenofibrate , levothyroxine , metoprolol , and Crestor .  She smokes approximately 14-15 cigarettes a day and has attempted to quit multiple times. She is allergic to nicotine  patches and cannot use gum or vaping as alternatives. She is active around the house, engaging in cleaning and caring for her dog. No chest pains, shortness of breath, passing out, lightheadedness, or dizziness.  Review of systems:  Please see the history of present illness. All other systems are reviewed and otherwise negative.      Studies Reviewed:    EKG Interpretation Date/Time:  Monday March 02 2024 14:36:17 EDT Ventricular Rate:  92 PR Interval:  126 QRS Duration:  76 QT Interval:  378 QTC Calculation: 467 R Axis:   -31  Text Interpretation: Normal sinus rhythm Left axis deviation Possible Anterolateral infarct , age undetermined When compared with ECG of 04-Mar-2023 08:38, QRS axis Shifted left Borderline criteria for Anterior infarct are now Present Borderline  criteria for Anterolateral infarct are now Present Confirmed by Rana Dixon [phone]) on 03/02/2024 4:41:22 PM    Cardiac PET/CT 03/26/2023   LV perfusion is normal. There is no evidence of ischemia. There is no evidence of infarction.   Rest left ventricular function is normal. Rest EF: 69%. Stress left ventricular function is normal. Stress EF: 68%. End diastolic cavity  size is normal. End systolic cavity size is normal.   Myocardial blood flow was computed to be 0.24ml/g/min at rest and 2.45ml/g/min at stress. Global myocardial blood flow reserve was 3.40 and was normal.   Coronary calcium  was present on the attenuation correction CT images. Moderate coronary calcifications were present. Coronary calcifications were present in the left anterior descending artery and left circumflex artery distribution(s).   The study is normal. The study is low risk.   Risk Assessment/Calculations:              Physical Exam:   VS:  BP 116/70 (BP Location: Left Arm, Patient Position: Sitting, Cuff Size: Normal)   Pulse 92   Ht 5' (1.524 m)   Wt 153 lb (69.4 kg)   BMI 29.88 kg/m    Wt Readings from Last 3 Encounters:  03/02/24 153 lb (69.4 kg)  02/13/24 151 lb 3.2 oz (68.6 kg)  02/07/24 149 lb (67.6 kg)    GEN: Well nourished, well developed in no acute distress NECK: No JVD; No carotid bruits CARDIAC: RRR, no murmurs, rubs, gallops RESPIRATORY:  Clear to auscultation without rales, wheezing or rhonchi  ABDOMEN: Soft, non-tender, non-distended EXTREMITIES:  No edema; No acute deformity      Assessment and Plan:  Coronary artery disease S/p CABG in 2002 and multiple interventions as noted in HPI Cardiac PET/CT 03/2023 was normal low risk study without ischemia or infarction - Today patient is without active chest pains.  She has history of chronic and atypical chest pains.  Reported symptoms today consistent with stable angina.  She denies any exertional symptoms.  Recent cardiac PET within the past year was reassuring.  No indication for further ischemic evaluation at this time - Continue amlodipine  5 mg daily, clopidogrel  75 mg daily, fenofibrate  67 mg daily, metoprolol  XL 50 mg daily, rosuvastatin  40 mg daily, and nitroglycerin  as needed  Hypertension Blood pressure today is well-controlled at 116/70 - Continue current antihypertensive  regimen  Hyperlipidemia LDL 68 on 02/2023 and well-controlled - Continue rosuvastatin  40 mg daily and fenofibrate  67 mg daily - Plan for repeat fasting lipid panel/LFTs  Tobacco abuse Currently smoking 1 pack/day - Encouraged complete cessation  COPD Well-controlled without dyspnea on exertion or rest.  Denies any recent exacerbations  Hypothyroidism TSH 2.43 on 02/2023 and well-controlled - Plans to have updated TSH with PCP in 2-weeks - Managed on levothyroxine   Preoperative cardiovascular clearance According to the Revised Cardiac Risk Index (RCRI), her Perioperative Risk of Major Cardiac Event is (%): 0.9. Her Functional Capacity in METs is: 5.07 according to the Duke Activity Status Index (DASI). Therefore, based on ACC/AHA guidelines, patient would be at acceptable risk for the planned procedure without further cardiovascular testing. I will route this recommendation to the requesting party via Epic fax function.  She may hold Plavix  for 5 days prior to procedure. Please resume Plavix  as soon as possible postprocedure, at the discretion of the surgeon.         Dispo:  Return in about 1 year (around 03/02/2025).  Signed, Dixon LITTIE Rana, NP

## 2024-03-02 NOTE — Patient Instructions (Signed)
 Medication Instructions:  NO CHANGES  Lab Work: FASTING LIPID PANEL, CMET, AND CBC TO BE DONE TODAY.  Testing/Procedures: NONE  Follow-Up: At [facility], you and your health needs are our priority.  As part of our continuing mission to provide you with exceptional heart care, our providers are all part of one team.  This team includes your primary Cardiologist (physician) and Advanced Practice Providers or APPs (Physician Assistants and Nurse Practitioners) who all work together to provide you with the care you need, when you need it.  Your next appointment:   1 YEAR  Provider:   DR. SWAZILAND, MD [name], NP

## 2024-03-03 LAB — CBC

## 2024-03-04 LAB — CBC
Hematocrit: 44.8 (ref 34.0–46.6)
Hemoglobin: 14.9 g/dL (ref 11.1–15.9)
MCH: 31.4 pg (ref 26.6–33.0)
MCHC: 33.3 g/dL (ref 31.5–35.7)
MCV: 95 fL (ref 79–97)
Platelets: 275 x10E3/uL (ref 150–450)
RBC: 4.74 x10E6/uL (ref 3.77–5.28)
RDW: 12.7 (ref 11.7–15.4)
WBC: 9 x10E3/uL (ref 3.4–10.8)

## 2024-03-04 LAB — COMPREHENSIVE METABOLIC PANEL WITH GFR
ALT: 7 IU/L (ref 0–32)
AST: 14 IU/L (ref 0–40)
Albumin: 4.6 g/dL (ref 3.9–4.9)
Alkaline Phosphatase: 58 IU/L (ref 44–121)
BUN/Creatinine Ratio: 16 (ref 12–28)
BUN: 11 mg/dL (ref 8–27)
Bilirubin Total: 0.3 mg/dL (ref 0.0–1.2)
CO2: 21 mmol/L (ref 20–29)
Calcium: 10.1 mg/dL (ref 8.7–10.3)
Chloride: 104 mmol/L (ref 96–106)
Creatinine, Ser: 0.7 mg/dL (ref 0.57–1.00)
Globulin, Total: 2.2 g/dL (ref 1.5–4.5)
Glucose: 90 mg/dL (ref 70–99)
Potassium: 4.7 mmol/L (ref 3.5–5.2)
Sodium: 140 mmol/L (ref 134–144)
Total Protein: 6.8 g/dL (ref 6.0–8.5)
eGFR: 95 mL/min/1.73 (ref >59)

## 2024-03-04 LAB — LIPID PANEL
Chol/HDL Ratio: 3.8 ratio (ref 0.0–4.4)
Cholesterol, Total: 148 mg/dL (ref 100–199)
HDL: 39 mg/dL — ABNORMAL LOW (ref >39)
LDL Chol Calc (NIH): 84 mg/dL (ref 0–99)
Triglycerides: 142 mg/dL (ref 0–149)
VLDL Cholesterol Cal: 25 mg/dL (ref 5–40)

## 2024-03-05 ENCOUNTER — Ambulatory Visit: Payer: Self-pay | Admitting: Emergency Medicine

## 2024-03-05 DIAGNOSIS — E782 Mixed hyperlipidemia: Secondary | ICD-10-CM

## 2024-03-05 DIAGNOSIS — I10 Essential (primary) hypertension: Secondary | ICD-10-CM

## 2024-03-05 DIAGNOSIS — Z79899 Other long term (current) drug therapy: Secondary | ICD-10-CM

## 2024-03-05 DIAGNOSIS — I25700 Atherosclerosis of coronary artery bypass graft(s), unspecified, with unstable angina pectoris: Secondary | ICD-10-CM

## 2024-03-06 ENCOUNTER — Other Ambulatory Visit: Payer: Self-pay | Admitting: *Deleted

## 2024-03-06 DIAGNOSIS — Z79899 Other long term (current) drug therapy: Secondary | ICD-10-CM

## 2024-03-06 DIAGNOSIS — I10 Essential (primary) hypertension: Secondary | ICD-10-CM

## 2024-03-06 DIAGNOSIS — I25700 Atherosclerosis of coronary artery bypass graft(s), unspecified, with unstable angina pectoris: Secondary | ICD-10-CM

## 2024-03-06 DIAGNOSIS — E782 Mixed hyperlipidemia: Secondary | ICD-10-CM

## 2024-03-06 NOTE — Telephone Encounter (Signed)
Pt returning call regarding test results. Please advise ?

## 2024-03-12 NOTE — Telephone Encounter (Signed)
 Patient calls nurse line requesting CT results.   She reports she would like to discuss them with PCP tomorrow at apt.   Advised we still have not received the official report from Radiology.   Advised will forward to PCP.

## 2024-03-13 ENCOUNTER — Ambulatory Visit: Payer: Self-pay

## 2024-03-13 ENCOUNTER — Ambulatory Visit

## 2024-03-13 DIAGNOSIS — K580 Irritable bowel syndrome with diarrhea: Secondary | ICD-10-CM

## 2024-03-13 MED ORDER — DEXLANSOPRAZOLE 30 MG PO CPDR: 30.0000 mg | DELAYED_RELEASE_CAPSULE | Freq: Every day | ORAL | 0 refills | Status: DC

## 2024-03-13 NOTE — Telephone Encounter (Signed)
-----   Message from Areta Saliva sent at 03/12/2024  9:22 PM EDT ----- Regarding: CT results Hello,   This patient wanted to review her CT results with you tomorrow.   Thanks,  Areta  ----- Message ----- From: Interface, Rad Results In Sent: 03/12/2024   4:27 PM EDT To: Areta Saliva, MD

## 2024-03-17 ENCOUNTER — Other Ambulatory Visit (HOSPITAL_COMMUNITY): Payer: Self-pay | Admitting: Family

## 2024-03-29 ENCOUNTER — Other Ambulatory Visit: Payer: Self-pay | Admitting: Cardiology

## 2024-04-01 ENCOUNTER — Other Ambulatory Visit: Payer: Self-pay

## 2024-04-01 DIAGNOSIS — E039 Hypothyroidism, unspecified: Secondary | ICD-10-CM

## 2024-04-01 MED ORDER — LEVOTHYROXINE SODIUM 88 MCG PO TABS: 88.0000 ug | ORAL_TABLET | Freq: Every day | ORAL | 3 refills | Status: AC

## 2024-04-01 NOTE — Telephone Encounter (Signed)
 Chart reviewed, has an appointment scheduled with me for this Friday, will repeat TSH then.  -Fairy Amy, MD

## 2024-04-03 ENCOUNTER — Ambulatory Visit (INDEPENDENT_AMBULATORY_CARE_PROVIDER_SITE_OTHER)

## 2024-04-03 VITALS — BP 138/78 | HR 95 | Ht 60.0 in | Wt 152.0 lb

## 2024-04-03 DIAGNOSIS — R739 Hyperglycemia, unspecified: Secondary | ICD-10-CM | POA: Diagnosis not present

## 2024-04-03 DIAGNOSIS — K219 Gastro-esophageal reflux disease without esophagitis: Secondary | ICD-10-CM | POA: Diagnosis not present

## 2024-04-03 DIAGNOSIS — E039 Hypothyroidism, unspecified: Secondary | ICD-10-CM

## 2024-04-03 DIAGNOSIS — I25708 Atherosclerosis of coronary artery bypass graft(s), unspecified, with other forms of angina pectoris: Secondary | ICD-10-CM

## 2024-04-03 DIAGNOSIS — K580 Irritable bowel syndrome with diarrhea: Secondary | ICD-10-CM | POA: Diagnosis not present

## 2024-04-03 DIAGNOSIS — G4701 Insomnia due to medical condition: Secondary | ICD-10-CM

## 2024-04-03 DIAGNOSIS — J069 Acute upper respiratory infection, unspecified: Secondary | ICD-10-CM | POA: Diagnosis not present

## 2024-04-03 LAB — POCT GLYCOSYLATED HEMOGLOBIN (HGB A1C): Hemoglobin A1C: 5.9 % — AB (ref 4.0–5.6)

## 2024-04-03 MED ORDER — PREDNISONE 50 MG PO TABS: 50.0000 mg | ORAL_TABLET | Freq: Every day | ORAL | 0 refills | Status: DC

## 2024-04-03 MED ORDER — NITROGLYCERIN 0.4 MG SL SUBL: 0.4000 mg | SUBLINGUAL_TABLET | SUBLINGUAL | 11 refills | Status: AC | PRN

## 2024-04-03 NOTE — Progress Notes (Signed)
 Patient reports fever and productive cough starting 04/01/2024. Patient seen at a [facility] facility and diagnosed with viral upper respiratory infection. Per Anesthesia Guidelines, patient must be rescheduled 4 weeks from URI diagnosis OR at least 2 weeks from the resolution of symptoms. Courtney made aware at Dr. Ronold office.

## 2024-04-03 NOTE — Patient Instructions (Addendum)
 Thank you for visiting the clinic today, it was good to see you!  Please always bring your medication bottles  In today's visit we discussed:  Viral illness: We gave you a 5-day dose of steroids to help you get through this infection.  It sounds most likely viral, but if your cough gets worse or you are feeling more feverish consider going to the ED.  Sleep: I recommend trying to extra-strength Tylenol  prior to bed, not the p.m.  Formulation.  If this does not work we will be seeing you in the follow-up.  I have attached sleep hygiene recommendations try to implement these to see if these may help as well.  IBS/GERD: I placed a GI referral for you.  Hypothyroidism: We repeated a TSH to ensure that you levothyroxine  dose is appropriate.  Please follow-up in 3 to 4 weeks for sleep and mood  For any questions, please call the office at [phone] or send me a message in MyChart. Have a great day!  -Fairy Amy, MD  [facility] Resident, PGY-1  What is sleep hygiene? Sleep hygiene is the term doctors use for habits that help you get good-quality sleep. Good-quality sleep means getting sleep that is long enough and is restful. The things we do throughout the day and before bed can affect how well we sleep. Sometimes, we do things that might make our sleep worse without realizing it. Good sleep hygiene is about understanding how to get better sleep.  Why is sleep important?  You need sleep to feel awake during the day and to be alert enough to do your normal activities. It's also important for keeping your body healthy. For example, sleep: ?Helps you learn information and form memories - As you sleep, the brain processes and stores information and experiences from when you were awake. ?Can help lower your risk of getting sick, or help you get better faster if you are sick ?Helps babies and children grow - This is because the body releases more growth hormone during sleep  than during the day. ?Helps children and adults build muscles and repair cells and tissues in the body Not getting enough sleep can have negative effects. These can include: ?Feeling irritable, anxious, or depressed ?Relationship problems, especially for children and teens ?An increased risk of high blood pressure, heart disease, stroke, kidney disease, obesity, and type 2 diabetes  How much sleep do I need?  It depends on your age, lifestyle, and general health. Your doctor or nurse can help you understand exactly how much sleep you or your child needs. The general recommendations for sleep are: ?Newborns (0 to 3 months) - 14 to 17 hours a day ?Infants (4 to 11 months) - 12 to 15 hours a day ?Toddlers (1 to 2 years) - 11 to 14 hours a day ?Preschool-aged children (3 to 5 years) - 10 to 13 hours a day ?School-aged children (6 to 13 years) - 9 to 11 hours a day ?Teens (14 to 17 years) - 8 to 10 hours a day ?Adults (26 to 64 years) - 7 to 9 hours a day ?Older adults (65 years or older) - 7 to 8 hours a day  If you do not get enough sleep each night, you build up a sleep debt. For example, if a school-aged child gets 8 hours of sleep instead of 10, they have a sleep debt of 2 hours. People with a sleep debt might need more than the recommended number of hours  of sleep until they catch up. Catching up on sleep can be difficult. The healthiest thing to do is to sleep the recommended number of hours each night. But if this is not possible because of your job or other responsibilities, try to catch up on sleep when you can. Remember that the length of sleep is not the only thing that makes for good sleep. The quality of sleep is just as important as the number of hours. This is because your brain and body move through sleep cycles as you sleep. Each cycle has a different purpose. If you do not sleep deeply, or if you wake up often throughout the night, you might not get enough time in each sleep  cycle.  How can I get better sleep?  Having good sleep hygiene means that you: ?Go to bed and get up at about the same time every day. ?Have coffee, tea, and other drinks or foods with caffeine only in the morning. ?Avoid eating close to bedtime - Try not to eat a large meal soon before you go to bed. It is better to eat a healthy and filling (but not too heavy) meal in the early evening. Try to avoid late-night snacking. ?Avoid alcohol in the late afternoon, evening, and bedtime. ?Avoid smoking, especially in the evening. ?Keep your bedroom dark, cool, quiet, and free of reminders of work or other things that cause you stress. ?Try to solve problems before you go to bed. ?Get plenty of physical activity, but not right before bed - Exercising 4 to 6 hours before bedtime has the best impact on sleep. ?Avoid looking at phones or reading devices (e-books) that give off light before bed - This can make it harder to fall asleep. There is not good evidence that wearing special blue light-filtering glasses works to improve sleep. ?Keep the place where you sleep quiet and dark - If needed, you can use a white noise machine or ear plugs to block out sound. Blackout curtains or an eye mask can help block out light. ?Do not check the time at night - Try to keep alarm clocks, watches, or smartphones out of your line of sight. Checking the time in the middle of the night can make you feel more awake, and can make it hard to fall back asleep. ?Avoid long naps if you have trouble sleeping at night, especially in the late afternoon. Short naps (about 20 minutes) can be helpful, especially if your work schedule changes day to day and you need to be alert at different times.  What if I am having trouble sleeping?  Everyone has a bad night of sleep sometimes. But if you regularly have trouble with sleep, see your doctor or nurse. They can check to see if you have a sleep disorder. Then, they can work with you  to treat the problem. Call for advice if you or your child: ?Regularly do not get the recommended amount of sleep ?Have a lot of trouble waking up in the morning ?Feel tired or have trouble focusing during the day ?Wake up often throughout the night and can't get back to sleep ?Have a lot of trouble falling asleep at night ?Have other problems related to or caused by sleep

## 2024-04-03 NOTE — Progress Notes (Signed)
 SUBJECTIVE:   CHIEF COMPLAINT / HPI:   URI/dyspnea: Sick contacts at home. Cough all night long last two days with subjective fevers and production of yellow thick sputum. Three episodes of emesis over the last 36 hours due to nausea.  Also endorses baseline dyspnea, states she cannot take the garbage out without getting winded.  States that she has to walk slower with friends and cannot keep up. No baseline cough or sputum production.  Had a PFT done in 2013 and was told she did not have COPD, low-dose chest CT 9/18 found lower lung mosaic attenuation and subpleural ground glass opacities right worse than left.  IBS: minimum of 5-6 watery/loose bowel movements a day ~6 days/week.  Also has baseline left upper quadrant abdominal pain, feels like stabbing pain that does not radiate, not associated with meals.  Has tried Imodium but reports pain worsens.  Denies blood in stool or dark stool, yellow sandy appearance.  Symptoms have been worsening over the last 12 months.  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  Appetite at baseline  GERD w/ gastroparesis: Diagnosed in 2013 with supposedly EGD and gastric emptying study.  Discontinued workup due to cost, been on Dexlansoprazole  since that time.  States that they were concerned about 3 holes in her stomach reports she has tried other OTC options such as omeprazole  and Pepcid which had no effect for her.  Sleep: Sleeps about 5-6 hours depending on how often she has to urinate and then can't fall back asleep. No caffeine in evenings. Feels like cramping leg pain and feet. Issue with back/leg pain for years. No history of injuries or fractures. History of pilonidal cyst surgery from [facility] in [place]  and has never felt the same since.  Health Maintenance: PCV deferred for next visit given feeling ill Influenza same as above TDAP and Shingrix same as above All other screenings up to  date  PERTINENT  PMH / PSH: Pilonidal cyst removal 1980s in [place]  Total abdominal hysterectomy 1980   OBJECTIVE:   BP 138/78   Pulse 95   Ht 5' (1.524 m)   Wt 152 lb (68.9 kg)   SpO2 95%   BMI 29.69 kg/m    Cardiac: Regular rate and rhythm. Normal S1/S2. No murmurs, rubs, or gallops appreciated. Lungs: Clear bilaterally to ascultation.  No wheezes or crackles Abdomen: Hyperactive bowel sounds.  Left upper quadrant tenderness to palpation. No rebound or guarding.    Psych: Depressed mood, reactive affect and appropriate appearance   ASSESSMENT/PLAN:   Assessment & Plan Irritable bowel syndrome with diarrhea Diagnosed in 2018 unclear who or how this diagnosis was made.  No family history of inflammatory bowel disease though with persistent left quadrant pain we will evaluate further.  No history of colonoscopy -Pending calprotectin -GI referral Gastroesophageal reflux disease, unspecified whether esophagitis present GERD VS PUD given patient history with holes in stomach, has not seen gastroenterology in over 10 years, has been taking PPI since then -Referral to gastroenterology, wonder if she would benefit from a repeat EGD -Continue dexlansoprazole  pending GI recs Viral upper respiratory tract infection Acute onset, gradually improving from yesterday.  With possible history of COPD and increased sputum production and cough concern for mild COPD exacerbation.  Given that she has surgery in 1 week will benefit from short course steroids. -50 mg p.o. prednisone  for 5 days -Tylenol  as needed for fever Insomnia due to medical condition Poorly controlled, sleeping 5  to 6 hours a night complicated by nocturia and left lower back pain.  Currently controlling with taking 3 Tylenol  PM every night, and often 2 more after waking up 4 to 5 hours later. -Instructed to try Tylenol  instead of Tylenol  PM but patient stated that she has tried this and it does not work -Will follow-up in 3  to 4 weeks to further evaluate nocturia and focus on this more Hypothyroidism, unspecified type Well-controlled, TSH today 2.66 last TSH 1 year ago 2.43. -Continue levothyroxine  88 mcg,  Hyperglycemia History of prediabetes, last A1c 7 years ago.  Well-balanced diet -A1c today 5.9, discussed with patient Coronary artery disease of bypass graft of native heart with stable angina pectoris Refilled Nitrostat , states that she rarely ever uses this.  Following with cardiology at [facility]     Fairy Amy, MD [facility]

## 2024-04-04 LAB — TSH RFX ON ABNORMAL TO FREE T4: TSH: 2.66 u[IU]/mL (ref 0.450–4.500)

## 2024-04-04 NOTE — Assessment & Plan Note (Signed)
 Refilled Nitrostat , states that she rarely ever uses this.  Following with cardiology at [facility]

## 2024-04-04 NOTE — Assessment & Plan Note (Signed)
 GERD VS PUD given patient history with holes in stomach, has not seen gastroenterology in over 10 years, has been taking PPI since then -Referral to gastroenterology, wonder if she would benefit from a repeat EGD -Continue dexlansoprazole  pending GI recs

## 2024-04-04 NOTE — Assessment & Plan Note (Signed)
 Diagnosed in 2018 unclear who or how this diagnosis was made.  No family history of inflammatory bowel disease though with persistent left quadrant pain we will evaluate further.  No history of colonoscopy -Pending calprotectin -GI referral

## 2024-04-04 NOTE — Assessment & Plan Note (Addendum)
 Well-controlled, TSH today 2.66 last TSH 1 year ago 2.43. -Continue levothyroxine  88 mcg,

## 2024-04-08 ENCOUNTER — Telehealth: Admitting: Physician Assistant

## 2024-04-08 DIAGNOSIS — J069 Acute upper respiratory infection, unspecified: Secondary | ICD-10-CM

## 2024-04-08 LAB — CALPROTECTIN, FECAL: Calprotectin, Fecal: 122 ug/g — ABNORMAL HIGH (ref 0–120)

## 2024-04-08 MED ORDER — PROMETHAZINE-DM 6.25-15 MG/5ML PO SYRP: 5.0000 mL | ORAL_SOLUTION | Freq: Four times a day (QID) | ORAL | 0 refills | Status: AC | PRN

## 2024-04-08 MED ORDER — FLUTICASONE PROPIONATE 50 MCG/ACT NA SUSP: 2.0000 | Freq: Every day | NASAL | 0 refills | Status: DC

## 2024-04-08 NOTE — Progress Notes (Signed)

## 2024-04-10 ENCOUNTER — Ambulatory Visit: Payer: Self-pay

## 2024-04-14 ENCOUNTER — Ambulatory Visit: Admitting: Pharmacist

## 2024-04-14 ENCOUNTER — Encounter: Payer: Self-pay | Admitting: Pharmacist

## 2024-04-14 VITALS — BP 93/63 | HR 72 | Ht 60.0 in | Wt 151.8 lb

## 2024-04-14 DIAGNOSIS — R051 Acute cough: Secondary | ICD-10-CM

## 2024-04-14 DIAGNOSIS — J449 Chronic obstructive pulmonary disease, unspecified: Secondary | ICD-10-CM

## 2024-04-14 DIAGNOSIS — Z720 Tobacco use: Secondary | ICD-10-CM | POA: Diagnosis not present

## 2024-04-14 MED ORDER — NICOTINE POLACRILEX 2 MG MT LOZG: 2.0000 mg | LOZENGE | OROMUCOSAL | 0 refills | Status: AC | PRN

## 2024-04-14 MED ORDER — NORTRIPTYLINE HCL 25 MG PO CAPS
25.0000 mg | ORAL_CAPSULE | Freq: Every day | ORAL | 2 refills | Status: DC
Start: 2024-04-14 — End: 2024-05-18

## 2024-04-14 MED ORDER — ALBUTEROL SULFATE HFA 108 (90 BASE) MCG/ACT IN AERS
2.0000 | INHALATION_SPRAY | Freq: Four times a day (QID) | RESPIRATORY_TRACT | 2 refills | Status: DC | PRN
Start: 2024-04-14 — End: 2024-05-18

## 2024-04-14 NOTE — Assessment & Plan Note (Addendum)
 Patient has been experiencing shortness and breath and cough for the past 2 week since having an upper respiratory infection and uses Flonase only. Spirometry evaluation with pre- and post-bronchodilator revealed mixed result as FEV1 PRE was 65% and went up to 73.5% POST bronchodilator. Pre-bronchodilator revealed Moderate obstructive lung disease and post-bronchodilator revealed normal spirometry and no significant pre and post change  Patient has tried using an inhaler before. Patient medication adherence good.  -Initiate Albuterol  Ventolin  inhaler 2 puffs every 6 hours as needed    -Educated patient on purpose, proper use, potential adverse effects including risk of esophageal candidiasis and need to rinse mouth after each use.   -Reviewed results of pulmonary function tests.  Pt verbalized understanding of results and education.

## 2024-04-14 NOTE — Assessment & Plan Note (Addendum)
 Tobacco use disorder with nicotine  dependence of smoking one pack a day for 26 years duration in a patient who is good candidate due to motivation. Insomnia managed with tylenol  PM suboptimal. Patient reports trying nicotine  patch but experienced an allergy to the adhesive. She has also tried Chantix (varenicline) and was very irritable. Has tried Wellbutrin  as well and said she was not able to tolerate.  -Initiated nicotine  replacement tx with Nicotine  lozenge 2 mg. Patient counseled on purpose, proper use, and potential adverse effects. -Initiate Nortriptyline 25 mg at bedtime for 1 week and then increase to 2 at bedtime (Recommended trial of reduced use of tylenol  PM)  -Counseled patient on strategies to avoid smoking and motivation  -Provided information on 1 800-QUIT NOW support program.

## 2024-04-14 NOTE — Patient Instructions (Signed)
 Nice to see you today!  Your breathing test showed mixed obstruction results. We hope your cough and shortness of breath will improve as you recover from your illness and as you quit smoking.  Medication Changes: - START Nicotine  lozenge 2 mg and Nortriptyline 25 mg every night for smoking cessation. In one week, increase to 2 capsules (50 mg) nightly. - START Albuterol  inhaler 2 puffs every 6 hours as needed for shortness of breath.  Please bring all medications to your clinic visits.  Please arrive 10-15 minutes prior to your scheduled visit time.

## 2024-04-14 NOTE — Progress Notes (Signed)
 Reviewed and agree with Dr Rennis plan.

## 2024-04-14 NOTE — Progress Notes (Signed)
 S:     Chief Complaint  Patient presents with   Medication Management    Spirometry    68 y.o. female who presents for diabetes evaluation, education, and management. Patient arrives in good spirits and presents without any assistance.   Patient was referred and last seen by Primary Care Provider, Dr. Lorrane, on 04/03/24.  At last visit, patient was referred for PFT due to concern of COPD. Patient mentioned that she has been recovering from a respiratory infection.   PMH is significant for hypertension, hyperlipidemia, GERD, tobacco use, insomnia.   Patient reports breathing has been difficult and has reported a lot of coughing.   Medication adherence reported good  Age when started using tobacco on a daily basis 37. Brand smoked Marlboro. Number of cigarettes/day 7 currently, down from 1 pack a day before getting sick.  Estimated nicotine  content per cigarette (mg) .5 mg.  Estimated nicotine  intake per day 3.5 mg.     Most recent quit attempt when she was 47 and stopped for 5 years and restarted smoking at 52. Longest time ever been tobacco free 5 years. Denies waking up at night to smoke. Motivation to quit: high, motivated because of granddaughter.   O: Review of Systems  Respiratory:  Positive for cough and shortness of breath.   All other systems reviewed and are negative.   Physical Exam Vitals reviewed.  Constitutional:      Appearance: Normal appearance.  Neurological:     Mental Status: She is alert.  Psychiatric:        Mood and Affect: Mood normal.        Behavior: Behavior normal.        Thought Content: Thought content normal.        Judgment: Judgment normal.    Vitals:   04/14/24 0849  BP: 93/63  Pulse: 72  SpO2: 95%   See scanned report or Documentation Flowsheet (discrete results - PFTs) for Spirometry results. Patient provided good effort while attempting spirometry.   Lung Age = 60 Albuterol  Neb  Lot# [id number]     Exp.  11/22/2024  A/P: Patient has been experiencing shortness and breath and cough for the past 2 week since having an upper respiratory infection and uses Flonase only. Spirometry evaluation with pre- and post-bronchodilator revealed mixed result as FEV1 PRE was 65% and went up to 73.5% POST bronchodilator. Pre-bronchodilator revealed Moderate obstructive lung disease and post-bronchodilator revealed normal spirometry and no significant pre and post change  Patient has tried using an inhaler before. Patient medication adherence good.  -Initiate Albuterol  Ventolin  inhaler 2 puffs every 6 hours as needed    -Educated patient on purpose, proper use, potential adverse effects including risk of esophageal candidiasis and need to rinse mouth after each use.   -Reviewed results of pulmonary function tests.  Pt verbalized understanding of results and education.    Tobacco use disorder with nicotine  dependence of smoking one pack a day for 26 years duration in a patient who is good candidate due to motivation. Insomnia managed with tylenol  PM suboptimal. Patient reports trying nicotine  patch but experienced an allergy to the adhesive. She has also tried Chantix (varenicline) and was very irritable. Has tried Wellbutrin  as well and said she was not able to tolerate.  -Initiated nicotine  replacement tx with Nicotine  lozenge 2 mg. Patient counseled on purpose, proper use, and potential adverse effects. -Initiate Nortriptyline 25 mg at bedtime for 1 week and then increase to 2 at bedtime (Recommended  trial of reduced use of tylenol  PM)  -Counseled patient on strategies to avoid smoking and motivation  -Provided information on 1 800-QUIT NOW support program.   Written patient instructions provided.   Total time in face to face counseling 57 minutes.    Follow-up:  Pharmacist 05/18/24 Patient seen with Lawson Mao, PharmD Candidate - PY3 student and Belvie Macintosh, PharmD - PY4 Candidate.

## 2024-04-15 NOTE — Progress Notes (Signed)
 I discussed this patient with the resident physician. I have reviewed their note and agree with their findings and treatment plan.  Milda Deed, MD

## 2024-05-06 ENCOUNTER — Other Ambulatory Visit

## 2024-05-06 ENCOUNTER — Other Ambulatory Visit (HOSPITAL_COMMUNITY)
Admission: RE | Admit: 2024-05-06 | Discharge: 2024-05-06 | Disposition: A | Discharge status: Home / Self Care | Source: Ambulatory Visit | Attending: Emergency Medicine | Admitting: Emergency Medicine

## 2024-05-06 DIAGNOSIS — I10 Essential (primary) hypertension: Secondary | ICD-10-CM | POA: Insufficient documentation

## 2024-05-17 ENCOUNTER — Other Ambulatory Visit: Payer: Self-pay | Admitting: Cardiology

## 2024-05-18 ENCOUNTER — Ambulatory Visit: Admitting: Pharmacist

## 2024-05-18 ENCOUNTER — Encounter: Payer: Self-pay | Admitting: Pharmacist

## 2024-05-18 ENCOUNTER — Other Ambulatory Visit: Payer: Self-pay | Admitting: Family Medicine

## 2024-05-18 VITALS — BP 90/56 | HR 77 | Wt 153.8 lb

## 2024-05-18 DIAGNOSIS — J449 Chronic obstructive pulmonary disease, unspecified: Secondary | ICD-10-CM

## 2024-05-18 DIAGNOSIS — J069 Acute upper respiratory infection, unspecified: Secondary | ICD-10-CM

## 2024-05-18 DIAGNOSIS — Z720 Tobacco use: Secondary | ICD-10-CM | POA: Diagnosis not present

## 2024-05-18 DIAGNOSIS — I10 Essential (primary) hypertension: Secondary | ICD-10-CM

## 2024-05-18 MED ORDER — ALBUTEROL SULFATE HFA 108 (90 BASE) MCG/ACT IN AERS: 2.0000 | INHALATION_SPRAY | Freq: Four times a day (QID) | RESPIRATORY_TRACT | 2 refills | Status: AC | PRN

## 2024-05-18 MED ORDER — AMLODIPINE BESYLATE 2.5 MG PO TABS: 2.5000 mg | ORAL_TABLET | Freq: Every day | ORAL | 3 refills | Status: AC

## 2024-05-18 MED ORDER — NORTRIPTYLINE HCL 50 MG PO CAPS: 50.0000 mg | ORAL_CAPSULE | Freq: Every day | ORAL | 2 refills | Status: AC

## 2024-05-18 MED ORDER — FLUTICASONE PROPIONATE 50 MCG/ACT NA SUSP
2.0000 | Freq: Every day | NASAL | 0 refills | Status: DC
Start: 2024-05-18 — End: 2024-05-18

## 2024-05-18 NOTE — Progress Notes (Signed)
 Reviewed and agree with Dr Rennis plan.

## 2024-05-18 NOTE — Assessment & Plan Note (Addendum)
 Tobacco use disorder recently returned to smoking 1 ppd due to stress. Good candidate for success because she is motivated to take care of health for her granddaughter. Patient reports that she has been tolerating Nortriptyline  and reports that breathing is better (minimal cough). Son and daughter-in-law are smokers as well and provide patient with cigarettes.  -Continued Nortriptyline  to 50 mg daily -Counseled patient to call Quit line to get nicotine  lozenges

## 2024-05-18 NOTE — Patient Instructions (Addendum)
 Nice to see you today!  Medication Changes: -Continue dose of Nortriptyline  to 50 mg daily. -Reduce dose of amlodipine  to 2.5 mg daily  Tobacco Patient Instructions  Quitting smoking is one of the most important decisions you can make for your current and future health. Consider what you dislike about smoking and how quitting could personally benefit you. Try to cut down.   Aim for reducing the amount you smoke by half over the next 2 weeks before surgery.  My target quit date is: 06/25/2024  Starting today, Be a Quitter!  Remind yourself why you want to quit.  Delay your first cigarette of the day for as long as possible.  Start cleaning out all pockets, drawers, and your car of cigarettes.  Getting Through the Cravings Once You Are Smoke Free: Each craving will last about 10 minutes, whether or not you smoke. Here's how to get through the cravings without cigarettes:  DELAY: Tell yourself that you'll wait for the next craving. Do it every time! DEEP BREATHS: One reason smoking feels good is because you breathe in deeply to inhale. Take four slow, deep breaths and feel the relaxation without the hamful effects of cigarettes. DRINK WATER : Drink a glass of cool water . It will give your hands and mouth something to do and will help flush the nicotine  out of your system faster. DIVERT: Do something else -- brush your teeth, take a walk, call a friend who can offer you support. Just moving onto something other than thinking about cigarettes will move you through the craving.   Frequently Asked Questions  What can I do when I get the urge to smoke? To get through the urge to smoke, try the following:  Review your reasons for quitting and think of all the benefits to your health, your finances, and your family.  Remind yourself that there is no such thing as just one cigarette -- or even one puff.  Ride out the desire to smoke. Use the 4 Os -- Delay, Deep Breaths, Drink Water  and Divert to  get you through. The craving will go away eventually. Do not fool yourself into thinking you can have just one cigarette.  Any tips on how to deal with stress? Stress is a natural part of life. The key is to deal with it without reaching for a cigarette. Taking deep breaths, counting backwards from 10 and asking yourself 1-how big a deal is this?"  Writing down your feelings, talking with a friend and doing things like positive self-talk and meditation are some other ways that people deal with daily stress.  What if I start smoking again? Slips happen. Most people try to quit smoking a few times before they are successful. Don't beat yourself up if this happens to you! Ask yourself if this was a slip or a relapse. A slip is a one-time mistake that is quickly corrected. A relapse is going back to your old smoking habits.   If you slip, don't give up. Think of it as a learning experience. Ask yourself what went wrong and renew your commitment to staying away from smoking for good.  If you relapse, try not to get discouraged. Ask yourself the question "What caused me to start smoking?" Figure out what helped you and what didn't when you tried to quit. Knowing why you relapsed is useful information for your next attempt to quit.  Please bring all medications to your clinic visits.  Please arrive 10-15 minutes prior to your scheduled  visit time.

## 2024-05-18 NOTE — Assessment & Plan Note (Addendum)
 In office pressures 90/56 very low and patient reports some dizziness a few times a week. Goal Blood Pressure is <130/80.  Given upcoming surgery, dose reduction of amlodipine  planned.  -Reduced dose of amlodipine  from 5 mg to 2.5 mg daily

## 2024-05-18 NOTE — Progress Notes (Signed)
   S:   Chief Complaint  Patient presents with   Medication Management    Tobacco cessation    68 y.o. female who presents for evaluation/assistance with tobacco dependence. Patient is in  good spirits.  Patient was referred and last seen by Primary Care Provider, Dr. Lorrane, on 04/03/2024.   PMH is significant for tobacco use, IBS, hypertension, hyperlipidemia, GERD, COPD, anxiety and depression.   Reports Nortriptyline  has helped with sleep, reports now sleeping 8 hours per night consistently.  She has also noticed improvement in her irritable bowel and depression symptoms with the use of nortriptyline .   Age when started using tobacco on a daily basis 37. Brand smoked Marlboro. Escalated back to 1 ppd due to stress following a reduction down to 7 cigarettes/day while sick.  Estimated nicotine  content per cigarette (mg) .5 mg.  Currently smokes a pack (20 cigs) a day - Estimated nicotine  intake per day 20 mg.     Most recent quit attempt when she was 47 and stopped for 5 years and restarted smoking at 52. Longest time ever been tobacco free 5 years. Denies waking up at night to smoke. Motivation to quit: high, motivated because of granddaughter.  Rates IMPORTANCE of quitting tobacco on 1-10 scale of 8. Rates CONFIDENCE of quitting tobacco by end of the year on 1-10 scale of 5.  Most common triggers to use tobacco include; stress   O: Clinical ASCVD: No  The 10-year ASCVD risk score (Arnett DK, et al., 2019) is: 7.8%   Values used to calculate the score:     Age: 28 years     Clincally relevant sex: Female     Is Non-Hispanic African American: No     Diabetic: No     Tobacco smoker: Yes     Systolic Blood Pressure: 93 mmHg     Is BP treated: Yes     HDL Cholesterol: 39 mg/dL     Total Cholesterol: 148 mg/dL  Review of Systems  All other systems reviewed and are negative.   Physical Exam Vitals reviewed.  Constitutional:      Appearance: Normal appearance.   Musculoskeletal:     Right lower leg: No edema.     Left lower leg: No edema.  Neurological:     Mental Status: She is alert.    A/P: Tobacco use disorder recently returned to smoking 1 ppd due to stress. Good candidate for success because she is motivated to take care of health for her granddaughter. Patient reports that she has been tolerating Nortriptyline  and reports that breathing is better (minimal cough). Son and daughter-in-law are smokers as well and provide patient with cigarettes.  -Continued Nortriptyline  to 50 mg daily -Counseled patient to call Quit line to get nicotine  lozenges -Initiated nicotine  replacement tx with nicotine  lozenges. Patient counseled on purpose, proper use, and potential adverse effects.   -Provided information on 1 800-QUIT NOW support program.  In office pressures 90/56 very low and patient reports some dizziness a few times a week. Goal Blood Pressure is <130/80.  Given upcoming surgery, dose reduction of amlodipine  planned.  -Reduced dose of amlodipine  from 5 mg to 2.5 mg daily  Written patient instructions provided. Patient verbalized understanding of treatment plan.  Total time in face to face counseling 23 minutes.    Follow-up:  Pharmacist 07/04/2023 - goal to quit by that time if possible Patient seen with Lawson Mao, PharmD Candidate - [employer] student

## 2024-05-28 ENCOUNTER — Other Ambulatory Visit

## 2024-06-02 ENCOUNTER — Telehealth (HOSPITAL_BASED_OUTPATIENT_CLINIC_OR_DEPARTMENT_OTHER): Payer: Self-pay | Admitting: *Deleted

## 2024-06-02 NOTE — Telephone Encounter (Signed)
   Pre-operative Risk Assessment    Patient Name: [name]  DOB: December 17, 1955 MRN: [id number]   Date of last office visit: 03/02/24 [name], NP Date of next office visit: NONE  Request for Surgical Clearance    Procedure:  1 TOOTH FOR SURGICAL EXTRACTION  Date of Surgery:  Clearance TBD                                Surgeon:  DR. PATEL, DMD Surgeon's Group or Practice Name:  [facility]  Phone number:  [phone]; [facility] Fax number:  [phone]   Type of Clearance Requested:   - Medical  - Pharmacy:  Hold Clopidogrel  (Plavix )     Type of Anesthesia:  Local    Additional requests/questions:    [name]   06/02/2024, 11:55 AM

## 2024-06-02 NOTE — Telephone Encounter (Signed)
   Primary Cardiologist: Peter Jordan, MD  Chart reviewed as part of pre-operative protocol coverage. Simple dental extractions (1-2 teeth) are considered low risk procedures per guidelines and generally do not require any specific cardiac clearance. It is also generally accepted that for simple extractions and dental cleanings, there is no need to interrupt blood thinner therapy.   SBE prophylaxis is not required for the patient.  I will route this recommendation to the requesting party via Epic fax function and remove from pre-op pool.  Please call with questions.  Rosaline EMERSON Bane, NP-C  06/02/2024, 12:01 PM [address] [place], [place] [address] Office [phone] Fax [phone]

## 2024-06-05 ENCOUNTER — Ambulatory Visit (HOSPITAL_BASED_OUTPATIENT_CLINIC_OR_DEPARTMENT_OTHER): Admission: RE | Admit: 2024-06-05 | Source: Home / Self Care | Admitting: Surgery

## 2024-06-05 SURGERY — EXCISION, MASS, NECK
Anesthesia: General | Laterality: Right

## 2024-06-22 ENCOUNTER — Other Ambulatory Visit: Payer: Self-pay

## 2024-06-22 DIAGNOSIS — K580 Irritable bowel syndrome with diarrhea: Secondary | ICD-10-CM

## 2024-06-22 MED ORDER — DEXLANSOPRAZOLE 30 MG PO CPDR: 30.0000 mg | DELAYED_RELEASE_CAPSULE | Freq: Every day | ORAL | 0 refills | Status: AC

## 2024-06-22 NOTE — Telephone Encounter (Signed)
 Chart reviewed  -Fairy Amy, MD

## 2024-07-03 ENCOUNTER — Ambulatory Visit: Admitting: Pharmacist

## 2025-02-01 ENCOUNTER — Encounter
